# Patient Record
Sex: Female | Born: 1945 | Race: White | Hispanic: No | Smoking: Never smoker
Health system: Southern US, Community
[De-identification: ages and names within clinical notes are randomized; demographics above are authoritative.]

## PROBLEM LIST (undated history)

## (undated) DIAGNOSIS — G44229 Chronic tension-type headache, not intractable: Secondary | ICD-10-CM

## (undated) DIAGNOSIS — F329 Major depressive disorder, single episode, unspecified: Secondary | ICD-10-CM

## (undated) DIAGNOSIS — F32A Depression, unspecified: Secondary | ICD-10-CM

## (undated) DIAGNOSIS — K602 Anal fissure, unspecified: Secondary | ICD-10-CM

## (undated) DIAGNOSIS — K269 Duodenal ulcer, unspecified as acute or chronic, without hemorrhage or perforation: Secondary | ICD-10-CM

## (undated) DIAGNOSIS — K7689 Other specified diseases of liver: Secondary | ICD-10-CM

## (undated) DIAGNOSIS — K315 Obstruction of duodenum: Secondary | ICD-10-CM

## (undated) DIAGNOSIS — K8020 Calculus of gallbladder without cholecystitis without obstruction: Secondary | ICD-10-CM

## (undated) DIAGNOSIS — E079 Disorder of thyroid, unspecified: Secondary | ICD-10-CM

## (undated) DIAGNOSIS — E039 Hypothyroidism, unspecified: Secondary | ICD-10-CM

## (undated) DIAGNOSIS — E559 Vitamin D deficiency, unspecified: Secondary | ICD-10-CM

## (undated) DIAGNOSIS — M797 Fibromyalgia: Secondary | ICD-10-CM

## (undated) DIAGNOSIS — M1990 Unspecified osteoarthritis, unspecified site: Secondary | ICD-10-CM

## (undated) DIAGNOSIS — D509 Iron deficiency anemia, unspecified: Secondary | ICD-10-CM

## (undated) DIAGNOSIS — F419 Anxiety disorder, unspecified: Secondary | ICD-10-CM

## (undated) DIAGNOSIS — M419 Scoliosis, unspecified: Secondary | ICD-10-CM

## (undated) DIAGNOSIS — E538 Deficiency of other specified B group vitamins: Secondary | ICD-10-CM

## (undated) DIAGNOSIS — K219 Gastro-esophageal reflux disease without esophagitis: Secondary | ICD-10-CM

## (undated) DIAGNOSIS — H409 Unspecified glaucoma: Secondary | ICD-10-CM

## (undated) DIAGNOSIS — I10 Essential (primary) hypertension: Secondary | ICD-10-CM

## (undated) DIAGNOSIS — T7840XA Allergy, unspecified, initial encounter: Secondary | ICD-10-CM

## (undated) DIAGNOSIS — K311 Adult hypertrophic pyloric stenosis: Secondary | ICD-10-CM

## (undated) DIAGNOSIS — K648 Other hemorrhoids: Secondary | ICD-10-CM

## (undated) DIAGNOSIS — M8580 Other specified disorders of bone density and structure, unspecified site: Secondary | ICD-10-CM

## (undated) HISTORY — DX: Fibromyalgia: M79.7

## (undated) HISTORY — DX: Anxiety disorder, unspecified: F41.9

## (undated) HISTORY — DX: Iron deficiency anemia, unspecified: D50.9

## (undated) HISTORY — DX: Duodenal ulcer, unspecified as acute or chronic, without hemorrhage or perforation: K26.9

## (undated) HISTORY — DX: Unspecified glaucoma: H40.9

## (undated) HISTORY — DX: Calculus of gallbladder without cholecystitis without obstruction: K80.20

## (undated) HISTORY — DX: Hypothyroidism, unspecified: E03.9

## (undated) HISTORY — DX: Scoliosis, unspecified: M41.9

## (undated) HISTORY — DX: Essential (primary) hypertension: I10

## (undated) HISTORY — DX: Other specified disorders of bone density and structure, unspecified site: M85.80

## (undated) HISTORY — DX: Disorder of thyroid, unspecified: E07.9

## (undated) HISTORY — DX: Other hemorrhoids: K64.8

## (undated) HISTORY — DX: Anal fissure, unspecified: K60.2

## (undated) HISTORY — DX: Chronic tension-type headache, not intractable: G44.229

## (undated) HISTORY — DX: Adult hypertrophic pyloric stenosis: K31.1

## (undated) HISTORY — PX: TONSILLECTOMY AND ADENOIDECTOMY: SUR1326

## (undated) HISTORY — DX: Depression, unspecified: F32.A

## (undated) HISTORY — DX: Obstruction of duodenum: K31.5

## (undated) HISTORY — DX: Unspecified osteoarthritis, unspecified site: M19.90

## (undated) HISTORY — DX: Major depressive disorder, single episode, unspecified: F32.9

## (undated) HISTORY — DX: Other specified diseases of liver: K76.89

## (undated) HISTORY — DX: Gastro-esophageal reflux disease without esophagitis: K21.9

## (undated) HISTORY — DX: Allergy, unspecified, initial encounter: T78.40XA

## (undated) HISTORY — DX: Vitamin D deficiency, unspecified: E55.9

## (undated) HISTORY — DX: Deficiency of other specified B group vitamins: E53.8

---

## 1973-09-20 HISTORY — PX: APPENDECTOMY: SHX54

## 1997-12-24 ENCOUNTER — Encounter: Admission: RE | Admit: 1997-12-24 | Discharge: 1998-03-24 | Payer: Self-pay | Admitting: Internal Medicine

## 1998-09-01 ENCOUNTER — Other Ambulatory Visit: Admission: RE | Admit: 1998-09-01 | Discharge: 1998-09-01 | Payer: Self-pay | Admitting: *Deleted

## 1999-08-25 ENCOUNTER — Other Ambulatory Visit: Admission: RE | Admit: 1999-08-25 | Discharge: 1999-08-25 | Payer: Self-pay | Admitting: *Deleted

## 1999-09-21 HISTORY — PX: SHOULDER ARTHROSCOPY: SHX128

## 1999-09-22 ENCOUNTER — Encounter: Payer: Self-pay | Admitting: Emergency Medicine

## 1999-09-22 ENCOUNTER — Emergency Department (HOSPITAL_COMMUNITY): Admission: EM | Admit: 1999-09-22 | Discharge: 1999-09-22 | Payer: Self-pay | Admitting: Emergency Medicine

## 1999-12-20 ENCOUNTER — Encounter: Payer: Self-pay | Admitting: Emergency Medicine

## 1999-12-20 ENCOUNTER — Emergency Department (HOSPITAL_COMMUNITY): Admission: EM | Admit: 1999-12-20 | Discharge: 1999-12-20 | Payer: Self-pay | Admitting: Emergency Medicine

## 2000-08-30 ENCOUNTER — Other Ambulatory Visit: Admission: RE | Admit: 2000-08-30 | Discharge: 2000-08-30 | Payer: Self-pay | Admitting: *Deleted

## 2000-10-25 ENCOUNTER — Encounter: Payer: Self-pay | Admitting: Internal Medicine

## 2000-10-25 ENCOUNTER — Encounter: Admission: RE | Admit: 2000-10-25 | Discharge: 2000-10-25 | Payer: Self-pay | Admitting: Internal Medicine

## 2001-02-08 ENCOUNTER — Encounter: Payer: Self-pay | Admitting: Internal Medicine

## 2001-02-08 ENCOUNTER — Encounter: Admission: RE | Admit: 2001-02-08 | Discharge: 2001-02-08 | Payer: Self-pay | Admitting: Internal Medicine

## 2001-04-10 ENCOUNTER — Encounter: Payer: Self-pay | Admitting: Emergency Medicine

## 2001-04-10 ENCOUNTER — Emergency Department (HOSPITAL_COMMUNITY): Admission: EM | Admit: 2001-04-10 | Discharge: 2001-04-10 | Payer: Self-pay | Admitting: Emergency Medicine

## 2001-09-15 ENCOUNTER — Encounter: Payer: Self-pay | Admitting: Surgery

## 2001-09-15 ENCOUNTER — Ambulatory Visit (HOSPITAL_COMMUNITY): Admission: RE | Admit: 2001-09-15 | Discharge: 2001-09-15 | Payer: Self-pay | Admitting: Surgery

## 2002-09-10 ENCOUNTER — Other Ambulatory Visit: Admission: RE | Admit: 2002-09-10 | Discharge: 2002-09-10 | Payer: Self-pay | Admitting: Obstetrics and Gynecology

## 2003-04-12 ENCOUNTER — Encounter: Admission: RE | Admit: 2003-04-12 | Discharge: 2003-04-12 | Payer: Self-pay | Admitting: Internal Medicine

## 2003-04-12 ENCOUNTER — Encounter: Payer: Self-pay | Admitting: Internal Medicine

## 2003-10-25 ENCOUNTER — Other Ambulatory Visit: Admission: RE | Admit: 2003-10-25 | Discharge: 2003-10-25 | Payer: Self-pay | Admitting: Obstetrics and Gynecology

## 2004-03-06 ENCOUNTER — Emergency Department (HOSPITAL_COMMUNITY): Admission: EM | Admit: 2004-03-06 | Discharge: 2004-03-06 | Payer: Self-pay | Admitting: Family Medicine

## 2004-03-12 ENCOUNTER — Emergency Department (HOSPITAL_COMMUNITY): Admission: EM | Admit: 2004-03-12 | Discharge: 2004-03-12 | Payer: Self-pay | Admitting: Family Medicine

## 2004-11-11 ENCOUNTER — Other Ambulatory Visit: Admission: RE | Admit: 2004-11-11 | Discharge: 2004-11-11 | Payer: Self-pay | Admitting: Obstetrics and Gynecology

## 2004-12-16 ENCOUNTER — Encounter: Admission: RE | Admit: 2004-12-16 | Discharge: 2004-12-16 | Payer: Self-pay | Admitting: Internal Medicine

## 2004-12-18 ENCOUNTER — Encounter: Admission: RE | Admit: 2004-12-18 | Discharge: 2004-12-18 | Payer: Self-pay | Admitting: Internal Medicine

## 2005-04-20 ENCOUNTER — Encounter: Admission: RE | Admit: 2005-04-20 | Discharge: 2005-04-20 | Payer: Self-pay | Admitting: Internal Medicine

## 2005-08-16 ENCOUNTER — Emergency Department (HOSPITAL_COMMUNITY): Admission: EM | Admit: 2005-08-16 | Discharge: 2005-08-17 | Payer: Self-pay | Admitting: Emergency Medicine

## 2005-11-22 ENCOUNTER — Other Ambulatory Visit: Admission: RE | Admit: 2005-11-22 | Discharge: 2005-11-22 | Payer: Self-pay | Admitting: Obstetrics and Gynecology

## 2006-04-13 ENCOUNTER — Encounter: Admission: RE | Admit: 2006-04-13 | Discharge: 2006-04-13 | Payer: Self-pay | Admitting: Internal Medicine

## 2006-05-04 ENCOUNTER — Ambulatory Visit: Payer: Self-pay | Admitting: Gastroenterology

## 2006-05-25 ENCOUNTER — Ambulatory Visit: Payer: Self-pay | Admitting: Gastroenterology

## 2006-06-07 ENCOUNTER — Ambulatory Visit: Payer: Self-pay | Admitting: Gastroenterology

## 2007-01-23 ENCOUNTER — Encounter: Admission: RE | Admit: 2007-01-23 | Discharge: 2007-01-23 | Payer: Self-pay | Admitting: Internal Medicine

## 2007-02-12 ENCOUNTER — Emergency Department (HOSPITAL_COMMUNITY): Admission: EM | Admit: 2007-02-12 | Discharge: 2007-02-12 | Payer: Self-pay | Admitting: Family Medicine

## 2007-10-31 ENCOUNTER — Encounter: Admission: RE | Admit: 2007-10-31 | Discharge: 2007-10-31 | Payer: Self-pay | Admitting: Internal Medicine

## 2007-11-28 ENCOUNTER — Emergency Department (HOSPITAL_COMMUNITY): Admission: EM | Admit: 2007-11-28 | Discharge: 2007-11-29 | Payer: Self-pay | Admitting: Emergency Medicine

## 2007-12-07 ENCOUNTER — Ambulatory Visit: Payer: Self-pay

## 2008-02-24 ENCOUNTER — Encounter: Admission: RE | Admit: 2008-02-24 | Discharge: 2008-02-24 | Payer: Self-pay | Admitting: Internal Medicine

## 2008-07-13 ENCOUNTER — Emergency Department (HOSPITAL_COMMUNITY): Admission: EM | Admit: 2008-07-13 | Discharge: 2008-07-13 | Payer: Self-pay | Admitting: Family Medicine

## 2008-08-19 ENCOUNTER — Ambulatory Visit: Payer: Self-pay | Admitting: Internal Medicine

## 2008-09-05 ENCOUNTER — Ambulatory Visit: Payer: Self-pay | Admitting: Internal Medicine

## 2008-10-21 ENCOUNTER — Ambulatory Visit: Payer: Self-pay | Admitting: Internal Medicine

## 2008-11-21 ENCOUNTER — Ambulatory Visit: Payer: Self-pay | Admitting: Internal Medicine

## 2008-11-26 ENCOUNTER — Ambulatory Visit: Payer: Self-pay | Admitting: Internal Medicine

## 2008-12-09 ENCOUNTER — Ambulatory Visit: Payer: Self-pay | Admitting: Internal Medicine

## 2009-02-11 ENCOUNTER — Ambulatory Visit: Payer: Self-pay | Admitting: Internal Medicine

## 2009-03-29 ENCOUNTER — Emergency Department (HOSPITAL_COMMUNITY): Admission: EM | Admit: 2009-03-29 | Discharge: 2009-03-29 | Payer: Self-pay | Admitting: Family Medicine

## 2009-05-19 ENCOUNTER — Ambulatory Visit: Payer: Self-pay | Admitting: Internal Medicine

## 2009-06-30 ENCOUNTER — Ambulatory Visit: Payer: Self-pay | Admitting: Internal Medicine

## 2009-08-12 ENCOUNTER — Ambulatory Visit: Payer: Self-pay | Admitting: Internal Medicine

## 2009-10-07 ENCOUNTER — Ambulatory Visit: Payer: Self-pay | Admitting: Internal Medicine

## 2009-11-03 ENCOUNTER — Ambulatory Visit: Payer: Self-pay | Admitting: Internal Medicine

## 2010-05-26 ENCOUNTER — Ambulatory Visit: Payer: Self-pay | Admitting: Internal Medicine

## 2010-06-01 ENCOUNTER — Ambulatory Visit: Payer: Self-pay | Admitting: Internal Medicine

## 2010-07-17 ENCOUNTER — Ambulatory Visit: Payer: Self-pay | Admitting: Internal Medicine

## 2010-07-20 ENCOUNTER — Ambulatory Visit: Payer: Self-pay | Admitting: Internal Medicine

## 2010-08-06 ENCOUNTER — Ambulatory Visit: Payer: Self-pay | Admitting: Internal Medicine

## 2010-08-12 ENCOUNTER — Encounter: Admission: RE | Admit: 2010-08-12 | Discharge: 2010-08-12 | Payer: Self-pay | Admitting: Internal Medicine

## 2010-09-20 LAB — HM PAP SMEAR

## 2010-12-28 ENCOUNTER — Ambulatory Visit (INDEPENDENT_AMBULATORY_CARE_PROVIDER_SITE_OTHER): Payer: Medicare Other | Admitting: Internal Medicine

## 2010-12-28 ENCOUNTER — Other Ambulatory Visit: Payer: Self-pay | Admitting: Internal Medicine

## 2010-12-28 DIAGNOSIS — K7689 Other specified diseases of liver: Secondary | ICD-10-CM

## 2010-12-28 DIAGNOSIS — E039 Hypothyroidism, unspecified: Secondary | ICD-10-CM

## 2011-03-01 ENCOUNTER — Other Ambulatory Visit: Payer: Self-pay | Admitting: Internal Medicine

## 2011-05-11 ENCOUNTER — Other Ambulatory Visit: Payer: Self-pay | Admitting: Internal Medicine

## 2011-06-02 ENCOUNTER — Inpatient Hospital Stay (INDEPENDENT_AMBULATORY_CARE_PROVIDER_SITE_OTHER)
Admission: RE | Admit: 2011-06-02 | Discharge: 2011-06-02 | Disposition: A | Discharge status: Home / Self Care | Payer: Medicare Other | Source: Ambulatory Visit | Attending: Family Medicine | Admitting: Family Medicine

## 2011-06-02 DIAGNOSIS — T148XXA Other injury of unspecified body region, initial encounter: Secondary | ICD-10-CM

## 2011-06-14 LAB — I-STAT 8, (EC8 V) (CONVERTED LAB)
HCT: 40
Hemoglobin: 13.6
Potassium: 4.2
Sodium: 127 — ABNORMAL LOW
TCO2: 26
pCO2, Ven: 39.5 — ABNORMAL LOW
pH, Ven: 7.406 — ABNORMAL HIGH

## 2011-06-14 LAB — CBC
MCHC: 35.1
Platelets: 330
RBC: 3.78 — ABNORMAL LOW
RDW: 12.8
WBC: 8.1

## 2011-06-14 LAB — DIFFERENTIAL
Eosinophils Absolute: 0.2
Eosinophils Relative: 2
Lymphocytes Relative: 35
Lymphs Abs: 2.8
Monocytes Relative: 9
Neutrophils Relative %: 54

## 2011-06-14 LAB — POCT CARDIAC MARKERS
CKMB, poc: <1 — ABNORMAL LOW
Myoglobin, poc: 35.9
Operator id: 196461
Troponin i, poc: <0.05
Troponin i, poc: <0.05

## 2011-06-14 LAB — D-DIMER, QUANTITATIVE: D-Dimer, Quant: 0.25

## 2011-06-24 ENCOUNTER — Other Ambulatory Visit: Payer: Self-pay | Admitting: Internal Medicine

## 2011-07-05 ENCOUNTER — Encounter: Payer: Self-pay | Admitting: Internal Medicine

## 2011-07-05 ENCOUNTER — Other Ambulatory Visit: Payer: Medicare Other | Admitting: Internal Medicine

## 2011-07-05 ENCOUNTER — Ambulatory Visit
Admission: RE | Admit: 2011-07-05 | Discharge: 2011-07-05 | Disposition: A | Discharge status: Home / Self Care | Payer: Medicare Other | Source: Ambulatory Visit | Attending: Internal Medicine | Admitting: Internal Medicine

## 2011-07-05 ENCOUNTER — Other Ambulatory Visit: Payer: Self-pay | Admitting: Internal Medicine

## 2011-07-05 ENCOUNTER — Ambulatory Visit (INDEPENDENT_AMBULATORY_CARE_PROVIDER_SITE_OTHER): Payer: Medicare Other | Admitting: Internal Medicine

## 2011-07-05 VITALS — BP 124/64 | HR 64 | Temp 97.4°F | Ht <= 58 in | Wt 143.0 lb

## 2011-07-05 DIAGNOSIS — M797 Fibromyalgia: Secondary | ICD-10-CM

## 2011-07-05 DIAGNOSIS — Z23 Encounter for immunization: Secondary | ICD-10-CM

## 2011-07-05 DIAGNOSIS — G44209 Tension-type headache, unspecified, not intractable: Secondary | ICD-10-CM

## 2011-07-05 DIAGNOSIS — E039 Hypothyroidism, unspecified: Secondary | ICD-10-CM

## 2011-07-05 DIAGNOSIS — K219 Gastro-esophageal reflux disease without esophagitis: Secondary | ICD-10-CM

## 2011-07-05 DIAGNOSIS — F419 Anxiety disorder, unspecified: Secondary | ICD-10-CM

## 2011-07-05 DIAGNOSIS — M8580 Other specified disorders of bone density and structure, unspecified site: Secondary | ICD-10-CM

## 2011-07-05 DIAGNOSIS — F329 Major depressive disorder, single episode, unspecified: Secondary | ICD-10-CM

## 2011-07-05 DIAGNOSIS — R1013 Epigastric pain: Secondary | ICD-10-CM

## 2011-07-05 DIAGNOSIS — I10 Essential (primary) hypertension: Secondary | ICD-10-CM

## 2011-07-05 DIAGNOSIS — F1011 Alcohol abuse, in remission: Secondary | ICD-10-CM

## 2011-07-05 DIAGNOSIS — F32A Depression, unspecified: Secondary | ICD-10-CM

## 2011-07-05 DIAGNOSIS — Z0000 Encounter for general adult medical examination without abnormal findings: Secondary | ICD-10-CM

## 2011-07-05 LAB — COMPREHENSIVE METABOLIC PANEL
AST: 19 U/L (ref 0–37)
Albumin: 3.9 g/dL (ref 3.5–5.2)
Alkaline Phosphatase: 68 U/L (ref 39–117)
BUN: 8 mg/dL (ref 6–23)
Potassium: 4.7 mEq/L (ref 3.5–5.3)
Sodium: 133 mEq/L — ABNORMAL LOW (ref 135–145)
Total Bilirubin: 0.3 mg/dL (ref 0.3–1.2)
Total Protein: 6 g/dL (ref 6.0–8.3)

## 2011-07-05 LAB — CBC WITH DIFFERENTIAL/PLATELET
Basophils Relative: 1 % (ref 0–1)
Eosinophils Absolute: 0.4 10*3/uL (ref 0.0–0.7)
Eosinophils Relative: 5 % (ref 0–5)
Lymphocytes Relative: 28 % (ref 12–46)
MCHC: 33.4 g/dL (ref 30.0–36.0)
Neutro Abs: 5.1 10*3/uL (ref 1.7–7.7)
Neutrophils Relative %: 59 % (ref 43–77)
Platelets: 417 10*3/uL — ABNORMAL HIGH (ref 150–400)
RDW: 13.9 % (ref 11.5–15.5)

## 2011-07-05 LAB — POCT URINALYSIS DIPSTICK
Ketones, UA: NEGATIVE
Protein, UA: NEGATIVE
Spec Grav, UA: 1
Urobilinogen, UA: NEGATIVE
pH, UA: 7.5

## 2011-07-05 LAB — LIPID PANEL
HDL: 34 mg/dL — ABNORMAL LOW (ref >39)
LDL Cholesterol: 115 mg/dL — ABNORMAL HIGH (ref 0–99)
Triglycerides: 205 mg/dL — ABNORMAL HIGH (ref <150)
VLDL: 41 mg/dL — ABNORMAL HIGH (ref 0–40)

## 2011-07-05 LAB — TSH: TSH: 1.226 u[IU]/mL (ref 0.350–4.500)

## 2011-07-06 ENCOUNTER — Telehealth: Payer: Self-pay | Admitting: *Deleted

## 2011-07-06 MED ORDER — LUBIPROSTONE 8 MCG PO CAPS: 8.0000 ug | ORAL_CAPSULE | Freq: Every day | ORAL | Status: AC

## 2011-07-06 NOTE — Telephone Encounter (Signed)
Rx called in per MD 

## 2011-07-09 ENCOUNTER — Telehealth: Payer: Self-pay | Admitting: Internal Medicine

## 2011-07-09 ENCOUNTER — Encounter: Payer: Self-pay | Admitting: Internal Medicine

## 2011-07-09 NOTE — Telephone Encounter (Signed)
Prescription for Amitiza 8 mg # 60 1 po bid faxed to [facility] [phone]) per Dr. Lenord Fellers.  Pt aware.  Dr. Lenord Fellers spoke directly to patient.

## 2011-07-09 NOTE — Telephone Encounter (Signed)
Spoke with pt. Once a day Amitiza not working. Has had to take 2 enemas. Increase 8mg  Amitiza to bid. New Rx sent to [facility].  Call back if no better by Monday. Can Increase dose  to 24 mg  if needed. Recent KUB showed stool which I think is causing her C/0 of bloating.

## 2011-07-11 ENCOUNTER — Other Ambulatory Visit: Payer: Self-pay | Admitting: Internal Medicine

## 2011-07-12 ENCOUNTER — Telehealth: Payer: Self-pay | Admitting: Internal Medicine

## 2011-07-12 NOTE — Telephone Encounter (Signed)
No continue on this dose for a couple of weeks may not work the same every day.

## 2011-07-12 NOTE — Telephone Encounter (Signed)
Pt advised to continue dosing as previously directed per Dr. Lenord Fellers and call pharmacy for refill when she runs out. Pt verbalized understanding.

## 2011-07-24 ENCOUNTER — Other Ambulatory Visit: Payer: Self-pay | Admitting: Internal Medicine

## 2011-07-26 DIAGNOSIS — F419 Anxiety disorder, unspecified: Secondary | ICD-10-CM | POA: Insufficient documentation

## 2011-07-26 DIAGNOSIS — E039 Hypothyroidism, unspecified: Secondary | ICD-10-CM | POA: Insufficient documentation

## 2011-07-26 DIAGNOSIS — M8580 Other specified disorders of bone density and structure, unspecified site: Secondary | ICD-10-CM | POA: Insufficient documentation

## 2011-07-26 DIAGNOSIS — M797 Fibromyalgia: Secondary | ICD-10-CM | POA: Insufficient documentation

## 2011-07-26 DIAGNOSIS — F32A Depression, unspecified: Secondary | ICD-10-CM | POA: Insufficient documentation

## 2011-07-26 DIAGNOSIS — K5900 Constipation, unspecified: Secondary | ICD-10-CM | POA: Insufficient documentation

## 2011-07-26 DIAGNOSIS — I10 Essential (primary) hypertension: Secondary | ICD-10-CM | POA: Insufficient documentation

## 2011-07-26 DIAGNOSIS — F1011 Alcohol abuse, in remission: Secondary | ICD-10-CM | POA: Insufficient documentation

## 2011-07-26 DIAGNOSIS — J309 Allergic rhinitis, unspecified: Secondary | ICD-10-CM | POA: Insufficient documentation

## 2011-07-26 DIAGNOSIS — K219 Gastro-esophageal reflux disease without esophagitis: Secondary | ICD-10-CM | POA: Insufficient documentation

## 2011-07-26 DIAGNOSIS — G44209 Tension-type headache, unspecified, not intractable: Secondary | ICD-10-CM | POA: Insufficient documentation

## 2011-07-26 DIAGNOSIS — J45909 Unspecified asthma, uncomplicated: Secondary | ICD-10-CM | POA: Insufficient documentation

## 2011-07-26 NOTE — Patient Instructions (Signed)
Continue same medications. Try Amitiza 8 mg daily for chronic constipation. Return in 6 months

## 2011-07-26 NOTE — Progress Notes (Signed)
  Subjective:    Patient ID: Sue Green, female    DOB: [birth year], 65 y.o.   MRN: [id number]  HPI 65 year old white female with history of multiple medical problems including hypertension, anxiety depression, recovering alcoholic, allergic rhinitis, GE reflux, asthma, fibromyalgia, osteopenia, tension headache, hypothyroidism and constipation for health maintenance and evaluation of medical issues. Complaining bitterly of constipation issues. Had colonoscopy February 23, 2006 and has had an extensive GI workup. Has frequent coughing treated with Tessalon Perles. 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 She is divorced.  Past medical history: had tonsillectomy and adenoidectomy Feb 23, 1954, wisdom tooth extraction 1967, appendectomy 23-Feb-1974. Mucous cyst removed from right thumb 1996. Right shoulder impingement arthroscopic surgery Feb 24, 2000. History of shingles 24-Feb-2007  Family history: Mother died in 51 at age 37 with dementia and diabetes mellitus. One brother in good health. Father died at age 32 with pancreatic cancer    Review of Systems  Constitutional: Positive for fatigue.  HENT: Positive for congestion.   Eyes: Negative.   Respiratory: Positive for cough.   Gastrointestinal: Positive for abdominal distention.  Genitourinary: Negative.   Neurological: Positive for headaches.  Hematological: Negative.   Psychiatric/Behavioral: Positive for dysphoric mood.       Objective:   Physical Exam  Vitals reviewed. Constitutional: She is oriented to person, place, and time. She appears well-developed and well-nourished.  HENT:  Head: Normocephalic and atraumatic.  Right Ear: External ear normal.  Left Ear: External ear normal.  Mouth/Throat: Oropharynx is clear and moist.  Eyes: Conjunctivae and EOM are normal. Pupils are equal, round, and reactive to light.  Neck: Neck supple. No JVD present. No thyromegaly present.  Cardiovascular: Normal  rate, regular rhythm and normal heart sounds.   Pulmonary/Chest: Effort normal and breath sounds normal. She has no wheezes. She has no rales.       Breasts normal female  Abdominal: Soft. Bowel sounds are normal. She exhibits distension. She exhibits no mass. There is no tenderness. There is no rebound.  Genitourinary:       Deferred  Musculoskeletal: She exhibits no edema.  Lymphadenopathy:    She has no cervical adenopathy.  Neurological: She is alert and oriented to person, place, and time. She has normal reflexes. No cranial nerve deficit.  Skin: Skin is warm and dry. She is not diaphoretic.  Psychiatric: Her behavior is normal. Thought content normal.          Assessment & Plan:  Constipation  Hypertension  Anxiety depression  Allergic rhinitis  GE reflux  Asthma  Fibromyalgia  Osteopenia  Tension headache  Hypothyroidism  Plan trial of Amitiza 8 mg. Return in 6 months or as needed

## 2011-07-27 ENCOUNTER — Telehealth: Payer: Self-pay | Admitting: Gastroenterology

## 2011-07-27 NOTE — Telephone Encounter (Signed)
Patient advised that she will be placed on the cancellation list  She is advised that she is welcome to call for cancellation openings.

## 2011-07-27 NOTE — Telephone Encounter (Signed)
Patient c/o constipation.  She spoke with Dr Marina Goodell last night on call.  She was instructed to start Miralax BID.  Patient not seen in the office since 2007.  I have scheduled her an appt for 08/17/11

## 2011-08-17 ENCOUNTER — Encounter: Payer: Self-pay | Admitting: Gastroenterology

## 2011-08-17 ENCOUNTER — Ambulatory Visit (INDEPENDENT_AMBULATORY_CARE_PROVIDER_SITE_OTHER): Payer: Medicare Other | Admitting: Gastroenterology

## 2011-08-17 VITALS — BP 128/76 | HR 72 | Ht 62.0 in | Wt 136.0 lb

## 2011-08-17 DIAGNOSIS — K5900 Constipation, unspecified: Secondary | ICD-10-CM

## 2011-08-17 DIAGNOSIS — R198 Other specified symptoms and signs involving the digestive system and abdomen: Secondary | ICD-10-CM

## 2011-08-17 MED ORDER — PEG-KCL-NACL-NASULF-NA ASC-C 100 G PO SOLR: 1.0000 | Freq: Once | ORAL | Status: DC

## 2011-08-17 NOTE — Patient Instructions (Addendum)
You have been scheduled for a Colonoscopy with propofol. See separate instructions.  Pick up your prep kit from your pharmacy.  Take your Miralax 2-3 x daily for constipation.  cc: Sharlet Salina, MD

## 2011-08-17 NOTE — Progress Notes (Addendum)
History of Present Illness: This is a 65 year old female that I have seen in the past and has very long history of constipation. She's been treated for constipation at least since the 1990s  She previously had a colonoscopy performed in 2002 for constipation, abdominal pain and bloating and colonoscopy performed in 2007 for constipation and hematochezia. Her colon was noted to be tortuous at that time.  She has tried MiraLax which has been relatively effective at once or twice daily. She tried Sport and exercise psychologist for a while but became constipated while taking it and had to manually disimpact herself in October. She notes her stools have been slightly smaller for the past several months. Denies weight loss, abdominal pain, diarrhea, melena, hematochezia, nausea, vomiting, dysphagia, reflux symptoms, chest pain.  Review of Systems: Pertinent positive and negative review of systems were noted in the above HPI section. All other review of systems were otherwise negative.  Current Medications, Allergies, Past Medical History, Past Surgical History, Family History and Social History were reviewed in [employer] record.  Physical Exam: General: Well developed , well nourished, no acute distress Head: Normocephalic and atraumatic Eyes:  sclerae anicteric, EOMI Ears: Normal auditory acuity Mouth: No deformity or lesions Neck: Supple, no masses or thyromegaly Lungs: Clear throughout to auscultation Heart: Regular rate and rhythm; no murmurs, rubs or bruits Abdomen: Soft, non tender and non distended. No masses, hepatosplenomegaly or hernias noted. Normal Bowel sounds Rectal: Deferred to colonoscopy  Musculoskeletal: Symmetrical with no gross deformities  Skin: No lesions on visible extremities Pulses:  Normal pulses noted Extremities: No clubbing, cyanosis, edema or deformities noted Neurological: Alert oriented x 4, grossly nonfocal Cervical Nodes:  No significant cervical  adenopathy Inguinal Nodes: No significant inguinal adenopathy Psychological:  Alert and cooperative. Anxious.  Assessment and Recommendations:  1. Chronic constipation associated with abdominal bloating and smaller stools. I suspect all her symptoms are due to her chronic constipation. Rule out colorectal neoplasms. Continue a daily fiber supplement and a daily stool softener. Increase MiraLax to 2 or 3 times each day. The risks, benefits, and alternatives to colonoscopy with possible biopsy and possible polypectomy were discussed with the patient and they consent to proceed.

## 2011-08-18 ENCOUNTER — Ambulatory Visit (AMBULATORY_SURGERY_CENTER): Payer: Medicare Other | Admitting: Gastroenterology

## 2011-08-18 ENCOUNTER — Encounter: Payer: Self-pay | Admitting: Gastroenterology

## 2011-08-18 VITALS — BP 134/57 | HR 73 | Temp 98.0°F | Resp 20 | Ht 62.0 in | Wt 136.0 lb

## 2011-08-18 DIAGNOSIS — R198 Other specified symptoms and signs involving the digestive system and abdomen: Secondary | ICD-10-CM

## 2011-08-18 DIAGNOSIS — K5900 Constipation, unspecified: Secondary | ICD-10-CM

## 2011-08-18 MED ORDER — SODIUM CHLORIDE 0.9 % IV SOLN: 500.0000 mL | INTRAVENOUS | Status: DC

## 2011-08-18 NOTE — Progress Notes (Signed)
Propofol administered by m smith crna. See scanned intra procedure report. ewm 

## 2011-08-18 NOTE — Progress Notes (Signed)
Patient did not experience any of the following events: a burn prior to discharge; a fall within the facility; wrong site/side/patient/procedure/implant event; or a hospital transfer or hospital admission upon discharge from the facility. (G8907) Patient did not have preoperative order for IV antibiotic SSI prophylaxis. (G8918)  

## 2011-08-19 ENCOUNTER — Telehealth: Payer: Self-pay

## 2011-08-19 NOTE — Telephone Encounter (Signed)
I left message for the pt to call us back if she had any questions or concerns.  maw

## 2011-08-24 ENCOUNTER — Other Ambulatory Visit: Payer: Self-pay | Admitting: Internal Medicine

## 2011-09-06 ENCOUNTER — Telehealth: Payer: Self-pay

## 2011-09-06 NOTE — Telephone Encounter (Signed)
Patient advised to stay on clear liquids, discontinue Miralax, and call the gastroenterologist for symptoms

## 2011-09-08 ENCOUNTER — Encounter: Payer: Self-pay | Admitting: Gastroenterology

## 2011-09-08 ENCOUNTER — Ambulatory Visit (INDEPENDENT_AMBULATORY_CARE_PROVIDER_SITE_OTHER): Payer: Medicare Other | Admitting: Gastroenterology

## 2011-09-08 VITALS — BP 122/60 | HR 64 | Ht 59.0 in | Wt 133.4 lb

## 2011-09-08 DIAGNOSIS — K219 Gastro-esophageal reflux disease without esophagitis: Secondary | ICD-10-CM

## 2011-09-08 DIAGNOSIS — K5900 Constipation, unspecified: Secondary | ICD-10-CM

## 2011-09-08 DIAGNOSIS — R112 Nausea with vomiting, unspecified: Secondary | ICD-10-CM

## 2011-09-08 MED ORDER — ONDANSETRON HCL 4 MG PO TABS: 4.0000 mg | ORAL_TABLET | Freq: Four times a day (QID) | ORAL | Status: AC

## 2011-09-08 NOTE — Patient Instructions (Addendum)
You have been given Anti reflux measures to follow. Please change your Protonix to 1 time daily everyday. Call us if symptoms do not improve. We have sent Zofran to your pharmacy to use for your nausea and vomiting. If you do not improve you may need an Endoscopy. CC:  Sharlet Salina MD

## 2011-09-08 NOTE — Progress Notes (Signed)
History of Present Illness: This is a 65 year old female complaining of intermittent nausea and vomiting the past 10 days. She was recently evaluated for chronic constipation and underwent colonoscopy jn November which was unremarkable. She is now treated with MiraLax on a daily basis as higher doses led to diarrhea. She states her nausea and vomiting has occurred after certain juices and spicy foods. She takes pantoprazole as needed instead of on daily basis. For the past 2 days she has used antacids on a regular. She denies any other medication changes. Denies weight loss, abdominal pain, constipation, diarrhea, change in stool caliber, melena, hematochezia,dysphagia, chest pain.  Current Medications, Allergies, Past Medical History, Past Surgical History, Family History and Social History were reviewed in [employer] record.  Physical Exam: General: Well developed , well nourished, no acute distress Head: Normocephalic and atraumatic Eyes:  sclerae anicteric, EOMI Ears: Normal auditory acuity Mouth: No deformity or lesions Lungs: Clear throughout to auscultation Heart: Regular rate and rhythm; no murmurs, rubs or bruits Abdomen: Soft, non tender and non distended. No masses, hepatosplenomegaly or hernias noted. Normal Bowel sounds Musculoskeletal: Symmetrical with no gross deformities  Pulses:  Normal pulses noted Extremities: No clubbing, cyanosis, edema or deformities noted Neurological: Alert oriented x 4, grossly nonfocal Psychological:  Alert and cooperative. Flat affect  Assessment and Recommendations:  1. Chronic constipation under good control with daily MiraLax.  2. GERD with intermittent regurgitation, nausea and vomiting. Intensify all antireflux measures and take pantoprazole 40 mg a daily basis for at least the next 4-6 weeks and she may then switch to as needed if her symptoms are under excellent control. Zofran 4-8 mg every 6 hours as needed. If her  symptoms do not adequately respond she will need further evaluation including upper endoscopy.

## 2011-09-09 ENCOUNTER — Other Ambulatory Visit: Payer: Self-pay | Admitting: Internal Medicine

## 2011-09-10 ENCOUNTER — Other Ambulatory Visit: Payer: Self-pay

## 2011-09-10 MED ORDER — CYCLOBENZAPRINE HCL 10 MG PO TABS: 10.0000 mg | ORAL_TABLET | ORAL | Status: DC

## 2011-09-21 ENCOUNTER — Other Ambulatory Visit: Payer: Self-pay | Admitting: Internal Medicine

## 2011-10-22 ENCOUNTER — Other Ambulatory Visit: Payer: Self-pay | Admitting: Internal Medicine

## 2011-10-22 ENCOUNTER — Other Ambulatory Visit: Payer: Self-pay

## 2011-10-22 MED ORDER — LEVOTHYROXINE SODIUM 75 MCG PO TABS: 75.0000 ug | ORAL_TABLET | Freq: Every day | ORAL | Status: DC

## 2011-11-10 ENCOUNTER — Other Ambulatory Visit: Payer: Self-pay | Admitting: Internal Medicine

## 2011-11-12 ENCOUNTER — Other Ambulatory Visit: Payer: Self-pay | Admitting: Internal Medicine

## 2011-11-20 ENCOUNTER — Other Ambulatory Visit: Payer: Self-pay | Admitting: Internal Medicine

## 2011-12-04 ENCOUNTER — Other Ambulatory Visit: Payer: Self-pay | Admitting: Internal Medicine

## 2011-12-13 ENCOUNTER — Other Ambulatory Visit: Payer: Self-pay | Admitting: Internal Medicine

## 2011-12-15 ENCOUNTER — Other Ambulatory Visit: Payer: Self-pay | Admitting: Internal Medicine

## 2011-12-16 ENCOUNTER — Other Ambulatory Visit: Payer: Self-pay

## 2011-12-16 MED ORDER — METOPROLOL TARTRATE 50 MG PO TABS: 50.0000 mg | ORAL_TABLET | ORAL | Status: DC

## 2012-01-04 ENCOUNTER — Ambulatory Visit (INDEPENDENT_AMBULATORY_CARE_PROVIDER_SITE_OTHER): Payer: Medicare Other | Admitting: Internal Medicine

## 2012-01-04 ENCOUNTER — Encounter: Payer: Self-pay | Admitting: Internal Medicine

## 2012-01-04 VITALS — BP 118/68 | HR 80 | Resp 20 | Ht 59.0 in | Wt 141.0 lb

## 2012-01-04 DIAGNOSIS — E039 Hypothyroidism, unspecified: Secondary | ICD-10-CM

## 2012-01-04 DIAGNOSIS — F419 Anxiety disorder, unspecified: Secondary | ICD-10-CM

## 2012-01-04 DIAGNOSIS — I10 Essential (primary) hypertension: Secondary | ICD-10-CM

## 2012-01-04 DIAGNOSIS — K219 Gastro-esophageal reflux disease without esophagitis: Secondary | ICD-10-CM

## 2012-01-04 DIAGNOSIS — K5900 Constipation, unspecified: Secondary | ICD-10-CM

## 2012-01-04 DIAGNOSIS — F32A Depression, unspecified: Secondary | ICD-10-CM

## 2012-01-04 DIAGNOSIS — F341 Dysthymic disorder: Secondary | ICD-10-CM

## 2012-01-04 DIAGNOSIS — F329 Major depressive disorder, single episode, unspecified: Secondary | ICD-10-CM

## 2012-01-04 NOTE — Patient Instructions (Signed)
Continue same medications and return for physical exam in 6 months 

## 2012-01-04 NOTE — Progress Notes (Signed)
  Subjective:    Patient ID: Sue Green, female    DOB: 12-05-1945, 66 y.o.   MRN: [id number]  HPI 66 year old White female with constipation, hypertension, GE reflux in for 6 month recheck.  Had recent impaction while on Amitiza. Stopped  Amitiza, disimpacted herself and started on Miralax. However, Amitiza did help bloating. Saw Dr. Russella Dar in December 2012. He recommended MiraLAX and recommended that she take PPI one half hour before meals. At this point she is doing well just on MiraLAX and PPI. Says bowel movements are normal. She had a health screening at an outside facility 4 years ago. They have recontacted her reminding her there was a mild to moderate plaque formation of her carotids. She's asymptomatic. Explained to her that did not think we needed to pursue this at present time. Blood pressure is well-controlled on current regimen. Anxiety depression well controlled. History of hypothyroidism on thyroid replacement therapy.    Review of Systems     Objective:   Physical Exam no carotid bruits; chest clear to auscultation; cardiac exam regular rate and rhythm; extremities without edema        Assessment & Plan:  History of anxiety depression  Hypertension  Constipation  GE reflux  Hypothyroidism  Plan: Patient will return in 6 months for physical exam. No changes made in her medication today. Does not need carotid artery screening at this point in

## 2012-01-04 NOTE — Progress Notes (Signed)
Addended by: Judy Pimple on: 01/04/2012 12:01 PM   Modules accepted: Orders

## 2012-03-07 ENCOUNTER — Other Ambulatory Visit: Payer: Self-pay | Admitting: Internal Medicine

## 2012-03-19 ENCOUNTER — Other Ambulatory Visit: Payer: Self-pay | Admitting: Internal Medicine

## 2012-03-31 ENCOUNTER — Other Ambulatory Visit: Payer: Self-pay | Admitting: Internal Medicine

## 2012-04-14 ENCOUNTER — Telehealth: Payer: Self-pay | Admitting: Internal Medicine

## 2012-04-14 MED ORDER — DICLOFENAC SODIUM 75 MG PO TBEC: 75.0000 mg | DELAYED_RELEASE_TABLET | Freq: Two times a day (BID) | ORAL | Status: DC

## 2012-04-14 NOTE — Telephone Encounter (Signed)
Refill sent to [facility] 

## 2012-06-06 ENCOUNTER — Other Ambulatory Visit: Payer: Self-pay | Admitting: Internal Medicine

## 2012-07-06 ENCOUNTER — Ambulatory Visit: Payer: Medicare Other | Admitting: Internal Medicine

## 2012-07-14 ENCOUNTER — Other Ambulatory Visit: Payer: Self-pay | Admitting: Internal Medicine

## 2012-07-17 ENCOUNTER — Other Ambulatory Visit: Payer: Medicare Other | Admitting: Internal Medicine

## 2012-07-17 ENCOUNTER — Encounter: Payer: Self-pay | Admitting: Internal Medicine

## 2012-07-17 ENCOUNTER — Ambulatory Visit (INDEPENDENT_AMBULATORY_CARE_PROVIDER_SITE_OTHER): Payer: Medicare Other | Admitting: Internal Medicine

## 2012-07-17 VITALS — BP 126/74 | HR 80 | Temp 98.2°F | Ht <= 58 in | Wt 147.0 lb

## 2012-07-17 DIAGNOSIS — F1021 Alcohol dependence, in remission: Secondary | ICD-10-CM

## 2012-07-17 DIAGNOSIS — J45909 Unspecified asthma, uncomplicated: Secondary | ICD-10-CM

## 2012-07-17 DIAGNOSIS — R059 Cough, unspecified: Secondary | ICD-10-CM

## 2012-07-17 DIAGNOSIS — F418 Other specified anxiety disorders: Secondary | ICD-10-CM

## 2012-07-17 DIAGNOSIS — K5900 Constipation, unspecified: Secondary | ICD-10-CM

## 2012-07-17 DIAGNOSIS — M8580 Other specified disorders of bone density and structure, unspecified site: Secondary | ICD-10-CM

## 2012-07-17 DIAGNOSIS — E039 Hypothyroidism, unspecified: Secondary | ICD-10-CM

## 2012-07-17 DIAGNOSIS — R05 Cough: Secondary | ICD-10-CM | POA: Insufficient documentation

## 2012-07-17 DIAGNOSIS — R053 Chronic cough: Secondary | ICD-10-CM

## 2012-07-17 DIAGNOSIS — I10 Essential (primary) hypertension: Secondary | ICD-10-CM

## 2012-07-17 DIAGNOSIS — R3915 Urgency of urination: Secondary | ICD-10-CM

## 2012-07-17 DIAGNOSIS — Z23 Encounter for immunization: Secondary | ICD-10-CM

## 2012-07-17 DIAGNOSIS — IMO0001 Reserved for inherently not codable concepts without codable children: Secondary | ICD-10-CM

## 2012-07-17 DIAGNOSIS — F341 Dysthymic disorder: Secondary | ICD-10-CM

## 2012-07-17 DIAGNOSIS — K219 Gastro-esophageal reflux disease without esophagitis: Secondary | ICD-10-CM

## 2012-07-17 DIAGNOSIS — M797 Fibromyalgia: Secondary | ICD-10-CM

## 2012-07-17 LAB — POCT URINALYSIS DIPSTICK
Bilirubin, UA: NEGATIVE
Ketones, UA: NEGATIVE
Leukocytes, UA: NEGATIVE
pH, UA: 5.5

## 2012-07-17 MED ORDER — PNEUMOCOCCAL VAC POLYVALENT 25 MCG/0.5ML IJ INJ: 0.5000 mL | INJECTION | INTRAMUSCULAR | Status: AC

## 2012-07-17 MED ORDER — TETANUS-DIPHTH-ACELL PERTUSSIS 5-2.5-18.5 LF-MCG/0.5 IM SUSP: 0.5000 mL | Freq: Once | INTRAMUSCULAR | Status: DC

## 2012-07-17 NOTE — Patient Instructions (Addendum)
Fasting labs drawn today as they were not done at last visit. See urologist if incontinence issues continue despite regular voiding attempts.Return in 6 months for PE. Given Pneumovax and Flu vaccines today.

## 2012-07-17 NOTE — Progress Notes (Signed)
  Subjective:    Patient ID: Sue Green, female    DOB: 16-Sep-1946, 66 y.o.   MRN: [id number]  HPI44 year old White female for 6 month recheck. Vesicare 5 mg daily is not working for urge urinary incontinence. C/o wax build up in ears. Hurts to turn neck to right. Takes tessalon perles for cough. Takes generic Protonix and generic Singulair. Is a recovering alcoholic. Seems chronically dysthymic. Multiple complaints. Continues to complain of chronic cough which is been previously evaluated. Complains of fatigue. Has been trying water aerobics for exercise. She is convinced she has wax buildup in her ears. History of anxiety.  Order written to get Zostavax vaccine at pharmacy. Was given influenza and Pneumovax immunizations today.  History of osteopenia, anxiety depression, allergic rhinitis, constipation, fibromyalgia syndrome, GE reflux, hypothyroidism in addition to hypertension and asthma. Constipation has improved with use of MiraLAX.    Review of Systems     Objective:   Physical Exam skin is warm and dry. Nodes none. HEENT exam: TMs and pharynx are clear. There is no significant cerumen in external ear canals. Neck is supple without thyromegaly JVD or adenopathy. Chest clear to auscultation. Cardiac exam regular rate and rhythm normal S1 and S2. Alert and oriented x3. Extremities without edema       Assessment & Plan:  Hypertension  Osteopenia  History of allergic rhinitis  History of asthma  Anxiety depression  Constipation  Fibromyalgia syndrome  GE reflux  Recovering alcoholic  Hypothyroidism  Chronic cough  Urge urinary incontinence  Plan: Return in 6 months or as needed. Should urge incontinence issues continue despite taking Vesicare refer to urologist.

## 2012-07-18 LAB — COMPREHENSIVE METABOLIC PANEL
Albumin: 4.2 g/dL (ref 3.5–5.2)
BUN: 9 mg/dL (ref 6–23)
Calcium: 9.2 mg/dL (ref 8.4–10.5)
Chloride: 100 mEq/L (ref 96–112)
Glucose, Bld: 83 mg/dL (ref 70–99)
Potassium: 4.5 mEq/L (ref 3.5–5.3)

## 2012-07-18 LAB — CBC WITH DIFFERENTIAL/PLATELET
HCT: 38.8 % (ref 36.0–46.0)
Hemoglobin: 13.2 g/dL (ref 12.0–15.0)
Lymphocytes Relative: 36 % (ref 12–46)
MCHC: 34 g/dL (ref 30.0–36.0)
Monocytes Absolute: 0.7 10*3/uL (ref 0.1–1.0)
Monocytes Relative: 10 % (ref 3–12)
Neutro Abs: 3.5 10*3/uL (ref 1.7–7.7)
WBC: 7.3 10*3/uL (ref 4.0–10.5)

## 2012-07-18 LAB — LIPID PANEL
HDL: 40 mg/dL (ref >39)
Triglycerides: 125 mg/dL (ref <150)

## 2012-07-18 LAB — TSH: TSH: 1.55 u[IU]/mL (ref 0.350–4.500)

## 2012-07-19 LAB — HM PAP SMEAR: HM Pap smear: NEGATIVE

## 2012-08-18 ENCOUNTER — Telehealth: Payer: Self-pay | Admitting: Internal Medicine

## 2012-08-18 NOTE — Telephone Encounter (Signed)
Patient called complaining of left-sided pain this morning. She has not taken any Tylenol to see if pain would be relieved. She has a history of abdominal pain that has been difficult to determine etiology other than constipation. Denies any musculoskeletal strain. Pain is below left rib cage area or axillary line. Not nauseated. No vomiting or diarrhea. No urinary symptoms. Says she wants an x-ray. Advised her office was closed. Explained to her that an x-ray might not be reasonable depending on her symptoms. Suggested she go to the urgent care or emergency department. Patient became irritated and said she would be calling [place] to get another doctor. Patient accused me of her refusing to treat her because I would not guarantee her an Xray. I consider this to be  demanding and inappropriate behavior. Patient will be sent a note indicating we will be happy to transfer her records to another physician of her choice.

## 2012-08-21 ENCOUNTER — Encounter: Payer: Self-pay | Admitting: Internal Medicine

## 2012-08-24 ENCOUNTER — Other Ambulatory Visit: Payer: Self-pay | Admitting: Internal Medicine

## 2012-08-25 ENCOUNTER — Other Ambulatory Visit: Payer: Self-pay

## 2012-08-25 MED ORDER — BENZONATATE 100 MG PO CAPS: 100.0000 mg | ORAL_CAPSULE | Freq: Three times a day (TID) | ORAL | Status: DC | PRN

## 2012-08-25 NOTE — Telephone Encounter (Signed)
Refill for 30 days only--- then call patient and tell her why it is only being refilled for 30 days per terms of the letter she received allowing her time to find another doctor.

## 2012-08-25 NOTE — Telephone Encounter (Signed)
Patient informed. 

## 2012-09-03 ENCOUNTER — Other Ambulatory Visit: Payer: Self-pay | Admitting: Internal Medicine

## 2012-09-07 ENCOUNTER — Ambulatory Visit: Payer: Medicare Other | Admitting: Internal Medicine

## 2012-09-11 ENCOUNTER — Encounter: Payer: Self-pay | Admitting: Internal Medicine

## 2012-09-11 ENCOUNTER — Ambulatory Visit (INDEPENDENT_AMBULATORY_CARE_PROVIDER_SITE_OTHER): Payer: Medicare Other | Admitting: Internal Medicine

## 2012-09-11 VITALS — BP 126/72 | HR 91 | Temp 97.9°F | Ht 59.0 in | Wt 145.8 lb

## 2012-09-11 DIAGNOSIS — Z0000 Encounter for general adult medical examination without abnormal findings: Secondary | ICD-10-CM

## 2012-09-11 NOTE — Progress Notes (Signed)
HPI  Pt presents to the clinic today to establish care. She is transferring care from Dr. Beryle Quant office. She has no concerns.  Past Medical History  Diagnosis Date  . Hypertension   . Anxiety   . Allergy   . GERD (gastroesophageal reflux disease)   . Asthma   . Thyroid disease     hypothyroidism  . Fibromyalgia   . Osteopenia   . Chronic tension headaches   . Internal hemorrhoids   . Depression   . Glaucoma   . Arthritis     Current Outpatient Prescriptions  Medication Sig Dispense Refill  . Ascorbic Acid (VITAMIN C) 500 MG tablet Take 500 mg by mouth daily.        . benzonatate (TESSALON) 100 MG capsule TAKE 2 CAPSULES BY MOUTH 3 TIMES A DAY AS NEEDED FOR COUGH  60 capsule  0  . Calcium-Vitamin D-Vitamin K (CALCIUM + D) [phone] MG-UNT-MCG CHEW Chew 1 tablet by mouth 2 (two) times daily.      Marland Kitchen CINNAMON PO Take 1 capsule by mouth 2 (two) times daily. 1000mg       . Coenzyme Q10 200 MG capsule Take 200 mg by mouth daily.      . [facility] STOOL SOFTENER 100 MG capsule TAKE 1 CAPSULE BY MOUTH EVERY DAY  30 each  12  . cyclobenzaprine (FLEXERIL) 10 MG tablet TAKE 1 TABLET (10 MG TOTAL) BY MOUTH 1 DAY OR 1 DOSE.  30 tablet  5  . desvenlafaxine (PRISTIQ) 100 MG 24 hr tablet Take 100 mg by mouth daily.        Marland Kitchen glucosamine-chondroitin 500-400 MG tablet Take 1 tablet by mouth 1 dose over 46 hours.        Marland Kitchen LORazepam (ATIVAN) 1 MG tablet Take 1 mg by mouth 2 (two) times daily as needed.        . Methylcellulose, Laxative, (CITRUCEL) 500 MG TABS Take by mouth daily.      . metoprolol succinate (TOPROL-XL) 50 MG 24 hr tablet TAKE 1 TABLET EVERY DAY  30 tablet  12  . montelukast (SINGULAIR) 10 MG tablet TAKE 1 TABLET BY MOUTH EVERY DAY  30 tablet  12  . Multiple Vitamins-Minerals (ONE-A-DAY WOMENS 50+ ADVANTAGE) TABS Take 1 tablet by mouth daily.      . naproxen sodium (ALEVE) 220 MG tablet Take 220 mg by mouth 2 (two) times daily with a meal.      . Omega-3 Fatty Acids (FISH OIL) 1200 MG  CAPS Take 1 capsule by mouth 3 (three) times daily.      . pantoprazole (PROTONIX) 40 MG tablet TAKE 1 TABLET EVERY DAY  30 tablet  11  . polyethylene glycol powder (GLYCOLAX/MIRALAX) powder TAKE 17GRAMS (1 CAPFUL) IN 8 OUNCES OF WATER ONCE DAILY  527 g  12  . pramoxine (SARNA SENSITIVE) 1 % LOTN Apply topically 2 (two) times daily.      Marland Kitchen SYNTHROID 75 MCG tablet TAKE 1 TABLET (75 MCG TOTAL) BY MOUTH DAILY.  30 tablet  3  . VESICARE 5 MG tablet TAKE ONE TABLET BY MOUTH ONCE DAILY  30 tablet  12  . vitamin B-12 (CYANOCOBALAMIN) 1000 MCG tablet Take 1,000 mcg by mouth daily.      . diclofenac (VOLTAREN) 75 MG EC tablet Take 1 tablet (75 mg total) by mouth 2 (two) times daily.  60 tablet  3  . vitamin E 400 UNIT capsule Take 1,200 Units by mouth daily.  Allergies  Allergen Reactions  . Penicillins Hives  . Doxycycline Nausea Only  . Diphenhydramine     Family History  Problem Relation Age of Onset  . Diabetes Mother   . Arthritis Mother   . Cancer Father     Pancreatic  . Diabetes Father     History   Social History  . Marital Status: Divorced    Spouse Name: N/A    Number of Children: N/A  . Years of Education: 16   Occupational History  . Retired    Social History Main Topics  . Smoking status: Never Smoker   . Smokeless tobacco: Never Used  . Alcohol Use: No  . Drug Use: No  . Sexually Active: Not on file   Other Topics Concern  . Not on file   Social History Narrative   Regular exercise-noCaffeine Use-unsure    ROS:  Constitutional: Denies fever, malaise, fatigue, headache or abrupt weight changes.  HEENT: Denies eye pain, eye redness, ear pain, ringing in the ears, wax buildup, runny nose, nasal congestion, bloody nose, or sore throat. Respiratory: Denies difficulty breathing, shortness of breath, cough or sputum production.   Cardiovascular: Denies chest pain, chest tightness, palpitations or swelling in the hands or feet.  Gastrointestinal: Denies  abdominal pain, bloating, constipation, diarrhea or blood in the stool.  GU: Denies frequency, urgency, pain with urination, blood in urine, odor or discharge. Musculoskeletal: Denies decrease in range of motion, difficulty with gait, muscle pain or joint pain and swelling.  Skin: Denies redness, rashes, lesions or ulcercations.  Neurological: Denies dizziness, difficulty with memory, difficulty with speech or problems with balance and coordination.   No other specific complaints in a complete review of systems (except as listed in HPI above).  PE:  BP 126/72  Pulse 91  Temp 97.9 F (36.6 C) (Oral)  Ht 4\' 11"  (1.499 m)  Wt 145 lb 12 oz (66.112 kg)  BMI 29.44 kg/m2  SpO2 95% Wt Readings from Last 3 Encounters:  09/11/12 145 lb 12 oz (66.112 kg)  07/17/12 147 lb (66.679 kg)  01/04/12 141 lb (63.957 kg)    General: Appears her stated age, overweight but well developed, well nourished in NAD. HEENT: Head: normal shape and size; Eyes: sclera white, no icterus, conjunctiva pink, PERRLA and EOMs intact; Ears: Tm's gray and intact, normal light reflex; Nose: mucosa pink and moist, septum midline; Throat/Mouth: Teeth present, mucosa pink and moist, no lesions or ulcerations noted.  Neck: Normal range of motion. Neck supple, trachea midline. No massses, lumps or thyromegaly present.  Cardiovascular: Normal rate and rhythm. S1,S2 noted.  No murmur, rubs or gallops noted. No JVD or BLE edema. No carotid bruits noted. Pulmonary/Chest: Normal effort and positive vesicular breath sounds. No respiratory distress. No wheezes, rales or ronchi noted.  Abdomen: Soft and nontender. Normal bowel sounds, no bruits noted. No distention or masses noted. Liver, spleen and kidneys non palpable. Musculoskeletal: Normal range of motion. No signs of joint swelling. No difficulty with gait.  Neurological: Alert and oriented. Cranial nerves II-XII intact. Coordination normal. +DTRs bilaterally. Psychiatric: Mood and  affect normal. Behavior is normal. Judgment and thought content normal.   EKG:  BMET    Component Value Date/Time   NA 134* 07/17/2012 0950   K 4.5 07/17/2012 0950   CL 100 07/17/2012 0950   CO2 27 07/17/2012 0950   GLUCOSE 83 07/17/2012 0950   BUN 9 07/17/2012 0950   CREATININE 0.70 07/17/2012 0950   CREATININE 0.8  11/28/2007 2258   CALCIUM 9.2 07/17/2012 0950    Lipid Panel     Component Value Date/Time   CHOL 190 07/17/2012 0950   TRIG 125 07/17/2012 0950   HDL 40 07/17/2012 0950   CHOLHDL 4.8 07/17/2012 0950   VLDL 25 07/17/2012 0950   LDLCALC 125* 07/17/2012 0950    CBC    Component Value Date/Time   WBC 7.3 07/17/2012 0950   RBC 4.01 07/17/2012 0950   HGB 13.2 07/17/2012 0950   HCT 38.8 07/17/2012 0950   PLT 417* 07/17/2012 0950   MCV 96.8 07/17/2012 0950   MCH 32.9 07/17/2012 0950   MCHC 34.0 07/17/2012 0950   RDW 13.4 07/17/2012 0950   LYMPHSABS 2.6 07/17/2012 0950   MONOABS 0.7 07/17/2012 0950   EOSABS 0.4 07/17/2012 0950   BASOSABS 0.1 07/17/2012 0950    Hgb A1C No results found for this basename: HGBA1C     Assessment and Plan:  Preventative Health Care:  Start a diet and exercise program All HM UTD Will defer lab work at this visit Continue taking all currently prescribed meds.  RTC in 6 months

## 2012-09-11 NOTE — Patient Instructions (Signed)

## 2012-09-17 ENCOUNTER — Other Ambulatory Visit: Payer: Self-pay | Admitting: Internal Medicine

## 2012-09-19 ENCOUNTER — Telehealth: Payer: Self-pay | Admitting: *Deleted

## 2012-09-19 NOTE — Telephone Encounter (Signed)
Patient  request medication refill  on Benzonate. cb # [phone]

## 2012-09-21 ENCOUNTER — Telehealth: Payer: Self-pay | Admitting: *Deleted

## 2012-09-21 ENCOUNTER — Ambulatory Visit (INDEPENDENT_AMBULATORY_CARE_PROVIDER_SITE_OTHER): Payer: 59 | Admitting: Internal Medicine

## 2012-09-21 ENCOUNTER — Encounter: Payer: Self-pay | Admitting: Internal Medicine

## 2012-09-21 VITALS — BP 120/80 | HR 82 | Temp 97.9°F | Ht 59.0 in | Wt 148.8 lb

## 2012-09-21 DIAGNOSIS — J069 Acute upper respiratory infection, unspecified: Secondary | ICD-10-CM

## 2012-09-21 MED ORDER — BENZONATATE 100 MG PO CAPS: 100.0000 mg | ORAL_CAPSULE | Freq: Three times a day (TID) | ORAL | Status: DC | PRN

## 2012-09-21 MED ORDER — AZITHROMYCIN 250 MG PO TABS: ORAL_TABLET | ORAL | Status: DC

## 2012-09-21 NOTE — Telephone Encounter (Signed)
Left smg on triage stating NP refilled her benzonatate, but the directions is wrong. She states she was taking med 2 pills three times a day. Want to know why directions was change...Sue Green

## 2012-09-21 NOTE — Progress Notes (Signed)
HPI  Pt presents to the clinic today with c/o cough. She has had this cough for about 4 days. It is dry an unproductive. Sometimes she feels like she has a hard time catching her breath. She has taken tessalon pearls which have helped some. Nothing makes it worse. She does have a history of asthma and allergies. She denies fever, chills, body aches, nausea and vomiting.  Review of Systems    Past Medical History  Diagnosis Date  . Hypertension   . Anxiety   . Allergy   . GERD (gastroesophageal reflux disease)   . Asthma   . Thyroid disease     hypothyroidism  . Fibromyalgia   . Osteopenia   . Chronic tension headaches   . Internal hemorrhoids   . Depression   . Glaucoma   . Arthritis     Family History  Problem Relation Age of Onset  . Diabetes Mother   . Arthritis Mother   . Cancer Father     Pancreatic  . Diabetes Father     History   Social History  . Marital Status: Divorced    Spouse Name: N/A    Number of Children: N/A  . Years of Education: 16   Occupational History  . Retired    Social History Main Topics  . Smoking status: Never Smoker   . Smokeless tobacco: Never Used  . Alcohol Use: No  . Drug Use: No  . Sexually Active: Not on file   Other Topics Concern  . Not on file   Social History Narrative   Regular exercise-noCaffeine Use-unsure    Allergies  Allergen Reactions  . Penicillins Hives  . Doxycycline Nausea Only  . Diphenhydramine      Constitutional: Denies headache, fatigue, fever or abrupt weight changes.  HEENT:  Positive sore throat. Denies eye redness, eye pain, pressure behind the eyes, facial pain, nasal congestion  ear pain, ringing in the ears, wax buildup, runny nose or bloody nose. Respiratory: Positive cough. Denies difficulty breathing or shortness of breath.  Cardiovascular: Denies chest pain, chest tightness, palpitations or swelling in the hands or feet.   No other specific complaints in a complete review of  systems (except as listed in HPI above).  Objective:    General: Appears her stated age, well developed, well nourished in NAD. HEENT: Head: normal shape and size; Eyes: sclera white, no icterus, conjunctiva pink, PERRLA and EOMs intact; Ears: Tm's gray and intact, normal light reflex; Nose: mucosa pink and moist, septum midline; Throat/Mouth: + PND. Teeth present, mucosa pink and moist, no exudate noted, no lesions or ulcerations noted.  Neck: Neck supple, trachea midline. No massses, lumps or thyromegaly present.  Cardiovascular: Normal rate and rhythm. S1,S2 noted.  No murmur, rubs or gallops noted. No JVD or BLE edema. No carotid bruits noted. Pulmonary/Chest: Normal effort and positive vesicular breath sounds. No respiratory distress. No wheezes, rales or ronchi noted.      Assessment & Plan:  Upper Respiratory Infection, new onset with additional workup required:  eRx for Tessalon Pearls eRx for azithromax x 5 days  RTC as needed or if symptoms persist.

## 2012-09-21 NOTE — Patient Instructions (Addendum)

## 2012-09-21 NOTE — Telephone Encounter (Signed)
Lucy, I refilled it straight from the original script. It is fine if she takes 2 pills TID. We can change the script. Rene Kocher

## 2012-09-21 NOTE — Telephone Encounter (Signed)
Notified pt with Regina response...lmb 

## 2012-09-25 ENCOUNTER — Other Ambulatory Visit: Payer: Self-pay | Admitting: Internal Medicine

## 2012-09-25 NOTE — Telephone Encounter (Signed)
Caller: Sheylin/Patient; Phone: [phone]; Reason for Call: Patient took her last Azithromycin today and asking for a refill.  She still has some cough, no fever.  Advised that this medication will still be in her system for up to  2 weeks but she wants additional medication.

## 2012-09-26 ENCOUNTER — Telehealth: Payer: Self-pay | Admitting: *Deleted

## 2012-09-26 ENCOUNTER — Telehealth: Payer: Self-pay | Admitting: Internal Medicine

## 2012-09-26 NOTE — Telephone Encounter (Signed)
Ash, As I recommended earlier, if she is not better she should be seen in the office. I am not going to refill the antibiotic without assessing her. Rene Kocher

## 2012-09-26 NOTE — Telephone Encounter (Signed)
Forward  78 pages from Dr. Lenord Fellers to Dr. Nicki Reaper for [facility] 09-26-12 ym

## 2012-09-26 NOTE — Telephone Encounter (Signed)
Pt was advised from [facility] RN that Azithromycin rx will still be in her system for up to 2 weeks, pt wants to know if it is two weeks from first pill taken or two weeks from last pill taken.

## 2012-09-26 NOTE — Telephone Encounter (Signed)
Patient called in, I let her know of Regina's recommendations, now she is requesting a call back from [place] this morning

## 2012-09-26 NOTE — Telephone Encounter (Signed)
Appointment scheduled 09/30/2011 2:00pm.

## 2012-09-26 NOTE — Telephone Encounter (Signed)
Pt informed of NP's advisement. Pt transferred to scheduler for appointment.

## 2012-09-26 NOTE — Telephone Encounter (Signed)
Ash, The cough may linger for another week. I am not going to refill the azithromax. She needs to use her tessalon pearls for a few more days, then if she is not better, she needs to be seen in the office again to reevaluate additional antibiotic needs. Rene Kocher

## 2012-09-26 NOTE — Telephone Encounter (Signed)
Ash, Two weeks from the last pill.  Rene Kocher

## 2012-09-26 NOTE — Telephone Encounter (Signed)
Pt informed of NP's advisement.  

## 2012-09-27 ENCOUNTER — Other Ambulatory Visit: Payer: Self-pay | Admitting: Internal Medicine

## 2012-09-29 ENCOUNTER — Ambulatory Visit: Payer: 59 | Admitting: Internal Medicine

## 2012-09-29 ENCOUNTER — Other Ambulatory Visit: Payer: Self-pay | Admitting: *Deleted

## 2012-09-29 MED ORDER — BENZONATATE 100 MG PO CAPS: 100.0000 mg | ORAL_CAPSULE | Freq: Three times a day (TID) | ORAL | Status: DC | PRN

## 2012-09-29 MED ORDER — BENZONATATE 100 MG PO CAPS: 200.0000 mg | ORAL_CAPSULE | Freq: Three times a day (TID) | ORAL | Status: DC | PRN

## 2012-09-29 NOTE — Telephone Encounter (Signed)
Pt informed rx corrected and sent to pharmacy as requested.

## 2012-09-29 NOTE — Telephone Encounter (Signed)
R'cd call from [facility] for refill of Tessalon. Rx sent.

## 2012-09-29 NOTE — Telephone Encounter (Addendum)
Pt wants rx for Benzonatate changed to 2 capsules three times a day instead of 1 capsule tid. Pt states that previous MD prescribed this for her and she has been taking this medication for years. She also wants refills. Per Nicki Reaper, NP-C okay to change rx.

## 2012-10-09 ENCOUNTER — Telehealth: Payer: Self-pay | Admitting: Internal Medicine

## 2012-10-09 ENCOUNTER — Ambulatory Visit (INDEPENDENT_AMBULATORY_CARE_PROVIDER_SITE_OTHER): Payer: 59 | Admitting: Internal Medicine

## 2012-10-09 ENCOUNTER — Encounter: Payer: Self-pay | Admitting: Internal Medicine

## 2012-10-09 VITALS — BP 122/80 | HR 79 | Temp 97.8°F | Ht 59.0 in | Wt 151.4 lb

## 2012-10-09 DIAGNOSIS — R32 Unspecified urinary incontinence: Secondary | ICD-10-CM

## 2012-10-09 DIAGNOSIS — J069 Acute upper respiratory infection, unspecified: Secondary | ICD-10-CM

## 2012-10-09 MED ORDER — OXYBUTYNIN 3.9 MG/24HR TD PTTW: 1.0000 | MEDICATED_PATCH | TRANSDERMAL | Status: DC

## 2012-10-09 MED ORDER — LEVOFLOXACIN 500 MG PO TABS: 500.0000 mg | ORAL_TABLET | Freq: Every day | ORAL | Status: DC

## 2012-10-09 NOTE — Patient Instructions (Signed)

## 2012-10-09 NOTE — Telephone Encounter (Signed)
Pt had called with questions about side effect of tendon rupture from Levofloxacin. Pt informed that this side effect is rare. Discussed benefits and risk of continuing medication. Offered option to change to a different class of antibiotics.  Pt declined. All questions answered.

## 2012-10-09 NOTE — Progress Notes (Signed)
HPI  Pt presents to the clinic today with c/o cold symptoms x 2 weeks. The worst part is the sore throat and dry cough. she does not produce any sputum. He was running fevers last week but none this week. She has tried Robitussin, Mucinex, cough drops and nothing seems to help. She does have sick contacts. Additionally, she does complain that the insurance will not cover her Vesicare anymore and is wondering if it could be changed to something else.  Review of Systems      Past Medical History  Diagnosis Date  . Hypertension   . Anxiety   . Allergy   . GERD (gastroesophageal reflux disease)   . Asthma   . Thyroid disease     hypothyroidism  . Fibromyalgia   . Osteopenia   . Chronic tension headaches   . Internal hemorrhoids   . Depression   . Glaucoma   . Arthritis     Family History  Problem Relation Age of Onset  . Diabetes Mother   . Arthritis Mother   . Cancer Father     Pancreatic  . Diabetes Father     History   Social History  . Marital Status: Divorced    Spouse Name: N/A    Number of Children: N/A  . Years of Education: 16   Occupational History  . Retired    Social History Main Topics  . Smoking status: Never Smoker   . Smokeless tobacco: Never Used  . Alcohol Use: No  . Drug Use: No  . Sexually Active: Not on file   Other Topics Concern  . Not on file   Social History Narrative   Regular exercise-noCaffeine Use-unsure    Allergies  Allergen Reactions  . Penicillins Hives  . Doxycycline Nausea Only  . Diphenhydramine      Constitutional: Positive headache, fatigue and fever. Denies abrupt weight changes.  HEENT:  Positive sore throat. Denies eye redness, eye pain, pressure behind the eyes, facial pain, nasal congestion, ear pain, ringing in the ears, wax buildup, runny nose or bloody nose. Respiratory: Positive cough. Denies difficulty breathing or shortness of breath.  Cardiovascular: Denies chest pain, chest tightness, palpitations  or swelling in the hands or feet.   No other specific complaints in a complete review of systems (except as listed in HPI above).  Objective:   BP 122/80  Pulse 79  Temp 97.8 F (36.6 C) (Oral)  Ht 4\' 11"  (1.499 m)  Wt 151 lb 6.4 oz (68.675 kg)  BMI 30.58 kg/m2  SpO2 98% Wt Readings from Last 3 Encounters:  10/09/12 151 lb 6.4 oz (68.675 kg)  09/21/12 148 lb 12 oz (67.473 kg)  09/11/12 145 lb 12 oz (66.112 kg)     General: Appears her stated age, well developed, well nourished in NAD. HEENT: Head: normal shape and size; Eyes: sclera white, no icterus, conjunctiva pink, PERRLA and EOMs intact; Ears: Tm's gray and intact, normal light reflex; Nose: mucosa pink and moist, septum midline; Throat/Mouth: + PND. Teeth present, mucosa erythematous and moist, no exudate noted, no lesions or ulcerations noted.  Neck:  Neck supple, trachea midline. No massses, lumps or thyromegaly present.  Cardiovascular: Normal rate and rhythm. S1,S2 noted.  No murmur, rubs or gallops noted. No JVD or BLE edema. No carotid bruits noted. Pulmonary/Chest: Normal effort and positive vesicular breath sounds. No respiratory distress. No wheezes, rales or ronchi noted.      Assessment & Plan:   Upper Respiratory Infection, new  onset with additional workup required:  Get some rest and drink plenty of water Do salt water gargles for the sore throat eRx for Levaquin x 7 days Continue tessalon pearls  Overactive bladder:  Stop Vesicare due to insurance change eRx for Oxytrol patches    RTC as needed or if symptoms persist.

## 2012-10-09 NOTE — Telephone Encounter (Signed)
Pt was seen in the office on 10/09/12 for URI symptoms.  Pt states she was prescribed Levaquin for her symptoms.  Pt states she has been reading the paperwork that came with the medication and one of the side effects was tendon rupture or tendon swelling.  Pt states she suffers from arthritis and bad knees and is really afraid to take this medication without speaking with NP Baity.  OFFICE COULD YOU PLEASE FOLLOW UP WITH PATIENT REGARDING MEDICATION.

## 2012-10-12 ENCOUNTER — Telehealth: Payer: Self-pay | Admitting: Internal Medicine

## 2012-10-12 NOTE — Telephone Encounter (Signed)
Patient Information:  Caller Name: Angie  Phone: [phone]  Patient: Sue Green, Sue Green  Gender: Female  DOB: 1946/06/17  Age: 67 Years  PCP: Nicki Reaper  Office Follow Up:  Does the office need to follow up with this patient?: Yes  Instructions For The Office: Please contact patient about the Levaquin/ Abdominal pain  RN Note:  Advised to hold the Levaquin until reviewed by the physician. Health Education reviewed on side effects of Levaquin.   Please constact patient - [phone] to discuss use of the medication.  Symptoms  Reason For Call & Symptoms: Patient she has been having issues with a cough. She saw Nicki Reaper NP on 09/21/12 . Treated with Zithromax Tessalon Pearls and she did not get any better. Patient was seen again, 10/09/12 for lingering cough and was prescribed Levaquin. She has had X4 doses thus far.   She states the cough is better but her stomach is aching and  bloating,  +flatus, no loose stools or diarrhea, no nausea. Location "around the waist".  Reviewed Health History In EMR: Yes  Reviewed Medications In EMR: Yes  Reviewed Allergies In EMR: Yes  Reviewed Surgeries / Procedures: No  Date of Onset of Symptoms: 10/11/2012  Treatments Tried: levaquin  Treatments Tried Worked: No  Guideline(s) Used:  Abdominal Pain - Female  Disposition Per Guideline:   Home Care  Reason For Disposition Reached:   Mild abdominal pain  Advice Given:  Call Back If:  Abdominal pain is constant and present for more than 2 hours  Abdominal pains come and go and are present for more than 24 hours  You are pregnant  You become worse.  Rest:  Lie down and rest until you feel better.  Reassurance:  A mild stomachache can be caused by indigestion, gas pains or overeating. Sometimes a stomachache signals the onset of a vomiting illness due to a viral gastroenteritis ("stomach flu").  Here is some care advice that should help.  Fluids:  Sip clear fluids only (e.g., water, flat  soft drinks or 1/2 strength fruit juice) until the pain has been gone for over 2 hours. Then slowly return to a regular diet.  Diet:  Slowly advance diet from clear liquids to a bland diet  Avoid alcohol or caffeinated beverages  Avoid greasy or fatty foods.  Pass a BM:  Sit on the toilet and try to pass a bowel movement (BM). Do not strain. This may relieve the pain if it is due to constipation or impending diarrhea.

## 2012-10-13 NOTE — Telephone Encounter (Signed)
Ash, Abdominal pain is a side effect of Levaquin. IT can be very harsh on the stomach. She can try to take it with food to see if that helps. The abdominal pain will go away  When she stops the medication. I would advise that if the cough is getting better while she is on the Levaquin and if she can tolerate the abdominal pain for a few more days, then I think it would be a good idea to finish the course of abx. If not, she can stop it, but her cough may return. Rene Kocher

## 2012-10-13 NOTE — Telephone Encounter (Signed)
Pt states that she will stop taking the Levaquin because it is causing too many side effects.

## 2012-11-07 ENCOUNTER — Other Ambulatory Visit: Payer: Self-pay | Admitting: Internal Medicine

## 2012-11-11 ENCOUNTER — Other Ambulatory Visit: Payer: Self-pay | Admitting: Internal Medicine

## 2012-11-14 ENCOUNTER — Telehealth: Payer: Self-pay | Admitting: *Deleted

## 2012-11-14 MED ORDER — LEVOTHYROXINE SODIUM 75 MCG PO TABS: ORAL_TABLET | ORAL | Status: DC

## 2012-11-14 NOTE — Telephone Encounter (Signed)
Called pt back inform her her pharmacy actually sent renewal to Dr. Lenord Fellers. [facility].../lmb

## 2012-11-14 NOTE — Telephone Encounter (Signed)
Left msg on triage stating been trying to get refills on her synthroid meds. Pharmacy haven't received call back...lmb

## 2012-11-26 ENCOUNTER — Other Ambulatory Visit: Payer: Self-pay | Admitting: Internal Medicine

## 2012-12-06 ENCOUNTER — Other Ambulatory Visit: Payer: Self-pay | Admitting: Internal Medicine

## 2012-12-07 ENCOUNTER — Other Ambulatory Visit: Payer: Self-pay | Admitting: Internal Medicine

## 2012-12-08 ENCOUNTER — Other Ambulatory Visit: Payer: Self-pay | Admitting: Internal Medicine

## 2012-12-13 ENCOUNTER — Other Ambulatory Visit: Payer: Self-pay | Admitting: Internal Medicine

## 2012-12-13 NOTE — Telephone Encounter (Signed)
Pt req refill for [facility]ukast [facility]mg  [facility] [address]. Please call pt if this is ok.

## 2012-12-20 ENCOUNTER — Other Ambulatory Visit: Payer: Self-pay | Admitting: Internal Medicine

## 2013-01-06 ENCOUNTER — Other Ambulatory Visit: Payer: Self-pay | Admitting: Internal Medicine

## 2013-01-09 ENCOUNTER — Other Ambulatory Visit: Payer: Self-pay | Admitting: Internal Medicine

## 2013-01-12 ENCOUNTER — Other Ambulatory Visit: Payer: Self-pay | Admitting: Internal Medicine

## 2013-01-16 ENCOUNTER — Other Ambulatory Visit: Payer: Self-pay | Admitting: Internal Medicine

## 2013-01-18 ENCOUNTER — Ambulatory Visit (INDEPENDENT_AMBULATORY_CARE_PROVIDER_SITE_OTHER): Payer: 59 | Admitting: Internal Medicine

## 2013-01-18 ENCOUNTER — Encounter: Payer: Self-pay | Admitting: Internal Medicine

## 2013-01-18 ENCOUNTER — Ambulatory Visit (INDEPENDENT_AMBULATORY_CARE_PROVIDER_SITE_OTHER)
Admission: RE | Admit: 2013-01-18 | Discharge: 2013-01-18 | Disposition: A | Discharge status: Home / Self Care | Payer: 59 | Source: Ambulatory Visit | Attending: Internal Medicine | Admitting: Internal Medicine

## 2013-01-18 VITALS — BP 154/82 | HR 81 | Temp 97.2°F | Ht 59.0 in | Wt 148.0 lb

## 2013-01-18 DIAGNOSIS — M545 Low back pain, unspecified: Secondary | ICD-10-CM

## 2013-01-18 DIAGNOSIS — M79605 Pain in left leg: Secondary | ICD-10-CM

## 2013-01-18 NOTE — Progress Notes (Signed)
Subjective:    Patient ID: Sue Green, female    DOB: 1946-05-05, 67 y.o.   MRN: [id number]  HPI  Pt presents to the clinic today with of pain on her left lower back side. The pain has been intermittent. It is a dull ache. It does not hurt worse with movement, bending or twisting. She has taken Aleve and put a heating pad on the area which does seem to help. Sometimes the pain radiates down into her left leg. She has had lower back pain in the past. She denies any specific back injury.   Review of Systems      Past Medical History  Diagnosis Date  . Hypertension   . Anxiety   . Allergy   . GERD (gastroesophageal reflux disease)   . Asthma   . Thyroid disease     hypothyroidism  . Fibromyalgia   . Osteopenia   . Chronic tension headaches   . Internal hemorrhoids   . Depression   . Glaucoma   . Arthritis     Current Outpatient Prescriptions  Medication Sig Dispense Refill  . Ascorbic Acid (VITAMIN C) 500 MG tablet Take 500 mg by mouth daily.        . benzonatate (TESSALON) 100 MG capsule TAKE 2 CAPSULES (200 MG TOTAL) BY MOUTH 3 (THREE) TIMES DAILY AS NEEDED FOR COUGH.  120 capsule  1  . CINNAMON PO Take 1 capsule by mouth 2 (two) times daily. 1000mg       . Coenzyme Q10 200 MG capsule Take 200 mg by mouth daily.      . [facility] STOOL SOFTENER 100 MG capsule TAKE 1 CAPSULE BY MOUTH EVERY DAY  30 each  12  . cyclobenzaprine (FLEXERIL) 10 MG tablet TAKE 1 TABLET (10 MG TOTAL) BY MOUTH 1 DAY OR 1 DOSE.  30 tablet  5  . desvenlafaxine (PRISTIQ) 100 MG 24 hr tablet Take 100 mg by mouth daily.        Marland Kitchen glucosamine-chondroitin 500-400 MG tablet Take 1 tablet by mouth 2 (two) times daily.       Marland Kitchen levothyroxine (SYNTHROID) 75 MCG tablet TAKE 1 TABLET (75 MCG TOTAL) BY MOUTH DAILY.  30 tablet  11  . LORazepam (ATIVAN) 1 MG tablet Take 1 mg by mouth 2 (two) times daily as needed.        . Methylcellulose, Laxative, (CITRUCEL) 500 MG TABS Take by mouth daily.      . metoprolol  succinate (TOPROL-XL) 50 MG 24 hr tablet TAKE 1 TABLET EVERY DAY  30 tablet  12  . montelukast (SINGULAIR) 10 MG tablet TAKE 1 TABLET BY MOUTH EVERY DAY  30 tablet  12  . Multiple Vitamins-Minerals (ONE-A-DAY WOMENS 50+ ADVANTAGE) TABS Take 1 tablet by mouth daily.      . naproxen sodium (ALEVE) 220 MG tablet Take 220 mg by mouth daily.       . Omega-3 Fatty Acids (FISH OIL) 1200 MG CAPS Take 1 capsule by mouth 3 (three) times daily.      . pantoprazole (PROTONIX) 40 MG tablet TAKE 1 TABLET EVERY DAY  30 tablet  11  . polyethylene glycol powder (GLYCOLAX/MIRALAX) powder TAKE 17GRAMS (1 CAPFUL) IN 8 OUNCES OF WATER ONCE DAILY  527 g  10  . pramoxine (SARNA SENSITIVE) 1 % LOTN Apply topically 2 (two) times daily.      . VESICARE 5 MG tablet TAKE ONE TABLET BY MOUTH ONCE DAILY  30 tablet  12  .  vitamin B-12 (CYANOCOBALAMIN) 1000 MCG tablet Take 1,000 mcg by mouth daily.      . Calcium-Vitamin D-Vitamin K (CALCIUM + D) [phone] MG-UNT-MCG CHEW Chew 1 tablet by mouth 2 (two) times daily.      Marland Kitchen oxybutynin (OXYTROL) 3.9 MG/24HR Place 1 patch onto the skin 2 (two) times a week.  8 patch  12   No current facility-administered medications for this visit.    Allergies  Allergen Reactions  . Penicillins Hives  . Doxycycline Nausea Only  . Diphenhydramine     Family History  Problem Relation Age of Onset  . Diabetes Mother   . Arthritis Mother   . Cancer Father     Pancreatic  . Diabetes Father     History   Social History  . Marital Status: Divorced    Spouse Name: N/A    Number of Children: N/A  . Years of Education: 16   Occupational History  . Retired    Social History Main Topics  . Smoking status: Never Smoker   . Smokeless tobacco: Never Used  . Alcohol Use: No  . Drug Use: No  . Sexually Active: Not on file   Other Topics Concern  . Not on file   Social History Narrative   Regular exercise-no   Caffeine Use-unsure     Constitutional: Denies fever, malaise,  fatigue, headache or abrupt weight changes.  GU: Denies urgency, frequency, pain with urination, burning sensation, blood in urine, odor or discharge. Musculoskeletal: Pt reports left lower back pain. Denies decrease in range of motion, difficulty with gait, or joint pain and swelling.  Neurological: Denies dizziness, difficulty with memory, difficulty with speech or problems with balance and coordination.   No other specific complaints in a complete review of systems (except as listed in HPI above).  Objective:   Physical Exam   BP 154/82  Pulse 81  Temp(Src) 97.2 F (36.2 C) (Oral)  Ht 4\' 11"  (1.499 m)  Wt 148 lb (67.132 kg)  BMI 29.88 kg/m2  SpO2 97% Wt Readings from Last 3 Encounters:  01/18/13 148 lb (67.132 kg)  10/09/12 151 lb 6.4 oz (68.675 kg)  09/21/12 148 lb 12 oz (67.473 kg)    General: Appears her stated age, well developed, well nourished in NAD.  Cardiovascular: Normal rate and rhythm. S1,S2 noted.  No murmur, rubs or gallops noted. No JVD or BLE edema. No carotid bruits noted. Pulmonary/Chest: Normal effort and positive vesicular breath sounds. No respiratory distress. No wheezes, rales or ronchi noted.  Musculoskeletal: Normal range of motion. No signs of joint swelling. No difficulty with gait. No CVA tenderness Neurological: Alert and oriented. Cranial nerves II-XII intact. Coordination normal. +DTRs bilaterally. Negative straight leg raise.       Assessment & Plan:   Low back pain on the left side with pain radiating down left leg, ? musculoskeletal in origin:  Will obtain xray of lumber spine to r/o stenosis with radiculopathy Given no urinary signs, will hold off on urinalysis Continue Aleve and heating pad at this time  Will f/u with you after results are back. If symptoms get worse or you become incontinent of bowel or bladder, please RTC asap.

## 2013-01-18 NOTE — Patient Instructions (Signed)
Back Pain, Adult Low back pain is very common. About 1 in 5 people have back pain.The cause of low back pain is rarely dangerous. The pain often gets better over time.About half of people with a sudden onset of back pain feel better in just 2 weeks. About 8 in 10 people feel better by 6 weeks.  CAUSES Some common causes of back pain include:  Strain of the muscles or ligaments supporting the spine.  Wear and tear (degeneration) of the spinal discs.  Arthritis.  Direct injury to the back. DIAGNOSIS Most of the time, the direct cause of low back pain is not known.However, back pain can be treated effectively even when the exact cause of the pain is unknown.Answering your caregiver's questions about your overall health and symptoms is one of the most accurate ways to make sure the cause of your pain is not dangerous. If your caregiver needs more information, he or she may order lab work or imaging tests (X-rays or MRIs).However, even if imaging tests show changes in your back, this usually does not require surgery. HOME CARE INSTRUCTIONS For many people, back pain returns.Since low back pain is rarely dangerous, it is often a condition that people can learn to manageon their own.   Remain active. It is stressful on the back to sit or stand in one place. Do not sit, drive, or stand in one place for more than 30 minutes at a time. Take short walks on level surfaces as soon as pain allows.Try to increase the length of time you walk each day.  Do not stay in bed.Resting more than 1 or 2 days can delay your recovery.  Do not avoid exercise or work.Your body is made to move.It is not dangerous to be active, even though your back may hurt.Your back will likely heal faster if you return to being active before your pain is gone.  Pay attention to your body when you bend and lift. Many people have less discomfortwhen lifting if they bend their knees, keep the load close to their bodies,and  avoid twisting. Often, the most comfortable positions are those that put less stress on your recovering back.  Find a comfortable position to sleep. Use a firm mattress and lie on your side with your knees slightly bent. If you lie on your back, put a pillow under your knees.  Only take over-the-counter or prescription medicines as directed by your caregiver. Over-the-counter medicines to reduce pain and inflammation are often the most helpful.Your caregiver may prescribe muscle relaxant drugs.These medicines help dull your pain so you can more quickly return to your normal activities and healthy exercise.  Put ice on the injured area.  Put ice in a plastic bag.  Place a towel between your skin and the bag.  Leave the ice on for 15 to 20 minutes, 3 to 4 times a day for the first 2 to 3 days. After that, ice and heat may be alternated to reduce pain and spasms.  Ask your caregiver about trying back exercises and gentle massage. This may be of some benefit.  Avoid feeling anxious or stressed.Stress increases muscle tension and can worsen back pain.It is important to recognize when you are anxious or stressed and learn ways to manage it.Exercise is a great option. SEEK MEDICAL CARE IF:  You have pain that is not relieved with rest or medicine.  You have pain that does not improve in 1 week.  You have new symptoms.  You are generally   not feeling well. SEEK IMMEDIATE MEDICAL CARE IF:   You have pain that radiates from your back into your legs.  You develop new bowel or bladder control problems.  You have unusual weakness or numbness in your arms or legs.  You develop nausea or vomiting.  You develop abdominal pain.  You feel faint. Document Released: 09/06/2005 Document Revised: 03/07/2012 Document Reviewed: 01/25/2011 [employer] Patient Information 2013 [employer].  

## 2013-01-24 ENCOUNTER — Other Ambulatory Visit: Payer: Self-pay | Admitting: Internal Medicine

## 2013-03-02 ENCOUNTER — Other Ambulatory Visit: Payer: Self-pay | Admitting: Internal Medicine

## 2013-03-12 ENCOUNTER — Ambulatory Visit (INDEPENDENT_AMBULATORY_CARE_PROVIDER_SITE_OTHER): Payer: 59 | Admitting: Internal Medicine

## 2013-03-12 ENCOUNTER — Encounter: Payer: Self-pay | Admitting: Internal Medicine

## 2013-03-12 VITALS — BP 132/62 | HR 74 | Ht 59.0 in | Wt 146.5 lb

## 2013-03-12 DIAGNOSIS — R3915 Urgency of urination: Secondary | ICD-10-CM

## 2013-03-12 DIAGNOSIS — F341 Dysthymic disorder: Secondary | ICD-10-CM

## 2013-03-12 DIAGNOSIS — K219 Gastro-esophageal reflux disease without esophagitis: Secondary | ICD-10-CM

## 2013-03-12 DIAGNOSIS — R059 Cough, unspecified: Secondary | ICD-10-CM

## 2013-03-12 DIAGNOSIS — I10 Essential (primary) hypertension: Secondary | ICD-10-CM

## 2013-03-12 DIAGNOSIS — R05 Cough: Secondary | ICD-10-CM

## 2013-03-12 DIAGNOSIS — E039 Hypothyroidism, unspecified: Secondary | ICD-10-CM

## 2013-03-12 DIAGNOSIS — R053 Chronic cough: Secondary | ICD-10-CM

## 2013-03-12 DIAGNOSIS — F419 Anxiety disorder, unspecified: Secondary | ICD-10-CM

## 2013-03-12 DIAGNOSIS — F32A Depression, unspecified: Secondary | ICD-10-CM

## 2013-03-12 NOTE — Assessment & Plan Note (Signed)
Will switch back to [employer]

## 2013-03-12 NOTE — Progress Notes (Signed)
Subjective:    Patient ID: Sue Green, female    DOB: March 26, 1946, 67 y.o.   MRN: [id number]  HPI  Pt presents to the clinic today for 6 month f/u of chronic medical conditions.  HTN- BP is usually less than 140/75. Well controlled on Toprol.  Anxiety and Depression- Well controlled on Pristiq and Ativan. Pt reports no need for therapy at this time. GERD: Well controlled on protonix. Will take Tums as needed. Hypothyroidism: on synthroid, labs last check 07/2012. Urinary Incontinence: Switched to oxytrol patches due to cost of vesicare. She does not think it is effective. She would like to switch back to vesicare.   Review of Systems      Past Medical History  Diagnosis Date  . Hypertension   . Anxiety   . Allergy   . GERD (gastroesophageal reflux disease)   . Asthma   . Thyroid disease     hypothyroidism  . Fibromyalgia   . Osteopenia   . Chronic tension headaches   . Internal hemorrhoids   . Depression   . Glaucoma   . Arthritis     Current Outpatient Prescriptions  Medication Sig Dispense Refill  . Ascorbic Acid (VITAMIN C) 500 MG tablet Take 500 mg by mouth daily.        . benzonatate (TESSALON) 100 MG capsule TAKE 2 CAPSULES (200 MG TOTAL) BY MOUTH 3 (THREE) TIMES DAILY AS NEEDED FOR COUGH.  120 capsule  2  . Calcium-Vitamin D-Vitamin K (CALCIUM + D) [phone] MG-UNT-MCG CHEW Chew 1 tablet by mouth 2 (two) times daily.      Marland Kitchen CINNAMON PO Take 1 capsule by mouth 2 (two) times daily. 1000mg       . Coenzyme Q10 200 MG capsule Take 200 mg by mouth daily.      . [facility] STOOL SOFTENER 100 MG capsule TAKE 1 CAPSULE BY MOUTH EVERY DAY  30 each  12  . cyclobenzaprine (FLEXERIL) 10 MG tablet TAKE 1 TABLET (10 MG TOTAL) BY MOUTH 1 DAY OR 1 DOSE.  30 tablet  5  . desvenlafaxine (PRISTIQ) 100 MG 24 hr tablet Take 100 mg by mouth daily.        Marland Kitchen glucosamine-chondroitin 500-400 MG tablet Take 1 tablet by mouth 2 (two) times daily.       Marland Kitchen levothyroxine (SYNTHROID) 75 MCG  tablet TAKE 1 TABLET (75 MCG TOTAL) BY MOUTH DAILY.  30 tablet  11  . LORazepam (ATIVAN) 1 MG tablet Take 1 mg by mouth 2 (two) times daily as needed.        . Methylcellulose, Laxative, (CITRUCEL) 500 MG TABS Take by mouth daily.      . metoprolol succinate (TOPROL-XL) 50 MG 24 hr tablet TAKE 1 TABLET EVERY DAY  30 tablet  12  . montelukast (SINGULAIR) 10 MG tablet TAKE 1 TABLET BY MOUTH EVERY DAY  30 tablet  12  . Multiple Vitamins-Minerals (ONE-A-DAY WOMENS 50+ ADVANTAGE) TABS Take 1 tablet by mouth daily.      . naproxen sodium (ALEVE) 220 MG tablet Take 220 mg by mouth daily.       . Omega-3 Fatty Acids (FISH OIL) 1200 MG CAPS Take 1 capsule by mouth 3 (three) times daily.      Marland Kitchen oxybutynin (OXYTROL) 3.9 MG/24HR Place 1 patch onto the skin 2 (two) times a week.  8 patch  12  . pantoprazole (PROTONIX) 40 MG tablet TAKE 1 TABLET EVERY DAY  30 tablet  11  . polyethylene  glycol powder (GLYCOLAX/MIRALAX) powder TAKE 17GRAMS (1 CAPFUL) IN 8 OUNCES OF WATER ONCE DAILY  527 g  10  . pramoxine (SARNA SENSITIVE) 1 % LOTN Apply topically 2 (two) times daily.      . VESICARE 5 MG tablet TAKE ONE TABLET BY MOUTH ONCE DAILY  30 tablet  12  . vitamin B-12 (CYANOCOBALAMIN) 1000 MCG tablet Take 1,000 mcg by mouth daily.       No current facility-administered medications for this visit.    Allergies  Allergen Reactions  . Penicillins Hives  . Doxycycline Nausea Only  . Diphenhydramine     Family History  Problem Relation Age of Onset  . Diabetes Mother   . Arthritis Mother   . Cancer Father     Pancreatic  . Diabetes Father     History   Social History  . Marital Status: Divorced    Spouse Name: N/A    Number of Children: N/A  . Years of Education: 16   Occupational History  . Retired    Social History Main Topics  . Smoking status: Never Smoker   . Smokeless tobacco: Never Used  . Alcohol Use: No  . Drug Use: No  . Sexually Active: Not on file   Other Topics Concern  . Not on  file   Social History Narrative   Regular exercise-no   Caffeine Use-unsure     Constitutional: Denies fever, malaise, fatigue, headache or abrupt weight changes.  HEENT: Pt reports dry mouth. Denies eye pain, eye redness, ear pain, ringing in the ears, wax buildup, runny nose, nasal congestion, bloody nose, or sore throat. Respiratory: Pt reports chronic cough. Denies difficulty breathing, shortness of breath, or sputum production.   Cardiovascular: Denies chest pain, chest tightness, palpitations or swelling in the hands or feet.  Gastrointestinal: Pt reports constipation. Denies abdominal pain, bloating, diarrhea or blood in the stool.  GU: Pt reports incontinence. Denies urgency, frequency, pain with urination, burning sensation, blood in urine, odor or discharge. Musculoskeletal: Pt reports arthritis. Denies decrease in range of motion, difficulty with gait, muscle pain or joint swelling.  Skin: Denies redness, rashes, lesions or ulcercations.  Neurological: Denies dizziness, difficulty with memory, difficulty with speech or problems with balance and coordination.   No other specific complaints in a complete review of systems (except as listed in HPI above).  Objective:   Physical Exam   BP 132/62  Pulse 74  Ht 4\' 11"  (1.499 m)  Wt 146 lb 8 oz (66.452 kg)  BMI 29.57 kg/m2  SpO2 97% Wt Readings from Last 3 Encounters:  03/12/13 146 lb 8 oz (66.452 kg)  01/18/13 148 lb (67.132 kg)  10/09/12 151 lb 6.4 oz (68.675 kg)    General: Appears her stated age, overweight but well developed, well nourished in NAD. Skin: Warm, dry and intact. No rashes, lesions or ulcerations noted. HEENT: Head: normal shape and size; Eyes: sclera white, no icterus, conjunctiva pink, PERRLA and EOMs intact; Ears: Tm's gray and intact, normal light reflex; Nose: mucosa pink and moist, septum midline; Throat/Mouth: Teeth present, mucosa pink and moist, no exudate, lesions or ulcerations noted.  Neck:  Normal range of motion. Neck supple, trachea midline. No massses, lumps or thyromegaly present.  Cardiovascular: Normal rate and rhythm. S1,S2 noted.  No murmur, rubs or gallops noted. No JVD or BLE edema. No carotid bruits noted. Pulmonary/Chest: Normal effort and positive vesicular breath sounds. No respiratory distress. No wheezes, rales or ronchi noted.  Abdomen: Soft and  nontender. Normal bowel sounds, no bruits noted. No distention or masses noted. Liver, spleen and kidneys non palpable. Musculoskeletal: Normal range of motion. No signs of joint swelling. No difficulty with gait.  Neurological: Alert and oriented. Cranial nerves II-XII intact. Coordination normal. +DTRs bilaterally. Psychiatric: Mood and affect normal. Behavior is normal. Judgment and thought content normal.     BMET    Component Value Date/Time   NA 134* 07/17/2012 0950   K 4.5 07/17/2012 0950   CL 100 07/17/2012 0950   CO2 27 07/17/2012 0950   GLUCOSE 83 07/17/2012 0950   BUN 9 07/17/2012 0950   CREATININE 0.70 07/17/2012 0950   CREATININE 0.8 11/28/2007 2258   CALCIUM 9.2 07/17/2012 0950    Lipid Panel     Component Value Date/Time   CHOL 190 07/17/2012 0950   TRIG 125 07/17/2012 0950   HDL 40 07/17/2012 0950   CHOLHDL 4.8 07/17/2012 0950   VLDL 25 07/17/2012 0950   LDLCALC 125* 07/17/2012 0950    CBC    Component Value Date/Time   WBC 7.3 07/17/2012 0950   RBC 4.01 07/17/2012 0950   HGB 13.2 07/17/2012 0950   HCT 38.8 07/17/2012 0950   PLT 417* 07/17/2012 0950   MCV 96.8 07/17/2012 0950   MCH 32.9 07/17/2012 0950   MCHC 34.0 07/17/2012 0950   RDW 13.4 07/17/2012 0950   LYMPHSABS 2.6 07/17/2012 0950   MONOABS 0.7 07/17/2012 0950   EOSABS 0.4 07/17/2012 0950   BASOSABS 0.1 07/17/2012 0950    Hgb A1C No results found for this basename: HGBA1C        Assessment & Plan:

## 2013-03-12 NOTE — Assessment & Plan Note (Signed)
Well controlled on current medications Consider CBT in the future

## 2013-03-12 NOTE — Assessment & Plan Note (Signed)
Well controlled Continue benzonate as needed

## 2013-03-12 NOTE — Assessment & Plan Note (Signed)
Well controlled on Protonix Continue Tums as needed

## 2013-03-12 NOTE — Patient Instructions (Signed)
DASH Diet  The DASH diet stands for "Dietary Approaches to Stop Hypertension." It is a healthy eating plan that has been shown to reduce high blood pressure (hypertension) in as little as 14 days, while also possibly providing other significant health benefits. These other health benefits include reducing the risk of breast cancer after menopause and reducing the risk of type 2 diabetes, heart disease, colon cancer, and stroke. Health benefits also include weight loss and slowing kidney failure in patients with chronic kidney disease.   DIET GUIDELINES  · Limit salt (sodium). Your diet should contain less than 1500 mg of sodium daily.  · Limit refined or processed carbohydrates. Your diet should include mostly whole grains. Desserts and added sugars should be used sparingly.  · Include small amounts of heart-healthy fats. These types of fats include nuts, oils, and tub margarine. Limit saturated and trans fats. These fats have been shown to be harmful in the body.  CHOOSING FOODS   The following food groups are based on a 2000 calorie diet. See your Registered Dietitian for individual calorie needs.  Grains and Grain Products (6 to 8 servings daily)  · Eat More Often: Whole-wheat bread, brown rice, whole-grain or wheat pasta, quinoa, popcorn without added fat or salt (air popped).  · Eat Less Often: White bread, white pasta, white rice, cornbread.  Vegetables (4 to 5 servings daily)  · Eat More Often: Fresh, frozen, and canned vegetables. Vegetables may be raw, steamed, roasted, or grilled with a minimal amount of fat.  · Eat Less Often/Avoid: Creamed or fried vegetables. Vegetables in a cheese sauce.  Fruit (4 to 5 servings daily)  · Eat More Often: All fresh, canned (in natural juice), or frozen fruits. Dried fruits without added sugar. One hundred percent fruit juice (½ cup [237 mL] daily).  · Eat Less Often: Dried fruits with added sugar. Canned fruit in light or heavy syrup.  Lean Meats, Fish, and Poultry (2  servings or less daily. One serving is 3 to 4 oz [85-114 g]).  · Eat More Often: Ninety percent or leaner ground beef, tenderloin, sirloin. Round cuts of beef, chicken breast, turkey breast. All fish. Grill, bake, or broil your meat. Nothing should be fried.  · Eat Less Often/Avoid: Fatty cuts of meat, turkey, or chicken leg, thigh, or wing. Fried cuts of meat or fish.  Dairy (2 to 3 servings)  · Eat More Often: Low-fat or fat-free milk, low-fat plain or light yogurt, reduced-fat or part-skim cheese.  · Eat Less Often/Avoid: Milk (whole, 2%). Whole milk yogurt. Full-fat cheeses.  Nuts, Seeds, and Legumes (4 to 5 servings per week)  · Eat More Often: All without added salt.  · Eat Less Often/Avoid: Salted nuts and seeds, canned beans with added salt.  Fats and Sweets (limited)  · Eat More Often: Vegetable oils, tub margarines without trans fats, sugar-free gelatin. Mayonnaise and salad dressings.  · Eat Less Often/Avoid: Coconut oils, palm oils, butter, stick margarine, cream, half and half, cookies, candy, pie.  FOR MORE INFORMATION  The Dash Diet Eating Plan: www.dashdiet.org  Document Released: 08/26/2011 Document Revised: 11/29/2011 Document Reviewed: 08/26/2011  Exit[employer]® Patient Information ©2014 [employer].

## 2013-03-12 NOTE — Assessment & Plan Note (Signed)
Continue DASH diet Well controlled

## 2013-03-12 NOTE — Assessment & Plan Note (Addendum)
Will recheck labs today Continue Synthroid

## 2013-03-25 ENCOUNTER — Other Ambulatory Visit: Payer: Self-pay | Admitting: Gastroenterology

## 2013-03-29 ENCOUNTER — Telehealth: Payer: Self-pay | Admitting: Internal Medicine

## 2013-03-29 NOTE — Telephone Encounter (Signed)
That she's looking for.  Patient advised that she was wanting to know when her last physical exam was.  Per our records, last CPE was 07/02/2011.  Patient was seen for 6 mo follow up in 2013 but did not have a physical exam in 2013.  Patient is now followed by Dr. Nicki Reaper.  Patient advised to hold off on copying the records and she'll contact Dr. Janith Lima office tomorrow (7/11) and see if they have that documentation in the 78 pages that was forwarded to her office at the time of patient transfer.  Patient advised she will call ME back to let us know how to proceed from here.  Gave patient my name and advised that I'm here M-F and will be happy to assist with copying her physical exam from 2012 and send to Dr. Sampson Si if the doctor's office will request that specific information.  Patient also had question as to when she had a colonoscopy.  Advised it was 07/2011.  Patient then stated that Dr. Sampson Si found that information on her computer.  Patient will follow up with ME from this point on regarding any copying of records.  As of today, patient does NOT want any records copied.

## 2013-03-29 NOTE — Telephone Encounter (Signed)
Patient has called several times past 2 days requesting her entire file including paper records. 78 pages were sent to  when she changed doctors last year. Will speak with [employer] about this request. Pt has been quite insistent that this be done right away. Patient will need to submit request in writing and was advised that records are the property of [facility]. She wanted to come pick up the file itself. She was told she could have a copy but not the original file.

## 2013-04-01 ENCOUNTER — Other Ambulatory Visit: Payer: Self-pay | Admitting: Internal Medicine

## 2013-04-02 ENCOUNTER — Telehealth: Payer: Self-pay | Admitting: Internal Medicine

## 2013-04-02 ENCOUNTER — Telehealth: Payer: Self-pay | Admitting: *Deleted

## 2013-04-02 DIAGNOSIS — M8580 Other specified disorders of bone density and structure, unspecified site: Secondary | ICD-10-CM

## 2013-04-02 DIAGNOSIS — Z0000 Encounter for general adult medical examination without abnormal findings: Secondary | ICD-10-CM

## 2013-04-02 NOTE — Telephone Encounter (Signed)
CPX labs entered for upcoming appointment.

## 2013-04-02 NOTE — Telephone Encounter (Signed)
Message copied by Carin Primrose on Mon Apr 02, 2013  8:32 AM ------      Message from: Livingston Diones      Created: Fri Mar 30, 2013  1:36 PM       Pt have a cpe 07/31/13. Please put in lab work a week prior. Pt stated insurance will pay for it, they have been paying in the past  ------

## 2013-05-31 ENCOUNTER — Other Ambulatory Visit: Payer: Self-pay | Admitting: Internal Medicine

## 2013-06-30 ENCOUNTER — Other Ambulatory Visit: Payer: Self-pay | Admitting: Internal Medicine

## 2013-07-03 ENCOUNTER — Other Ambulatory Visit: Payer: Self-pay | Admitting: *Deleted

## 2013-07-03 MED ORDER — DOCUSATE SODIUM 100 MG PO CAPS: ORAL_CAPSULE | ORAL | Status: DC

## 2013-07-19 ENCOUNTER — Encounter: Payer: Self-pay | Admitting: Internal Medicine

## 2013-07-26 ENCOUNTER — Telehealth: Payer: Self-pay

## 2013-07-26 DIAGNOSIS — Z0000 Encounter for general adult medical examination without abnormal findings: Secondary | ICD-10-CM

## 2013-07-26 NOTE — Telephone Encounter (Signed)
CPX labs entered  

## 2013-07-31 ENCOUNTER — Encounter: Payer: Self-pay | Admitting: Internal Medicine

## 2013-07-31 ENCOUNTER — Ambulatory Visit (INDEPENDENT_AMBULATORY_CARE_PROVIDER_SITE_OTHER): Payer: Medicare Other | Admitting: Internal Medicine

## 2013-07-31 ENCOUNTER — Other Ambulatory Visit (INDEPENDENT_AMBULATORY_CARE_PROVIDER_SITE_OTHER): Payer: 59

## 2013-07-31 ENCOUNTER — Encounter: Payer: 59 | Admitting: Internal Medicine

## 2013-07-31 VITALS — BP 140/76 | HR 88 | Temp 97.1°F | Ht 59.0 in | Wt 146.0 lb

## 2013-07-31 DIAGNOSIS — Z0000 Encounter for general adult medical examination without abnormal findings: Secondary | ICD-10-CM

## 2013-07-31 DIAGNOSIS — Z23 Encounter for immunization: Secondary | ICD-10-CM

## 2013-07-31 DIAGNOSIS — I10 Essential (primary) hypertension: Secondary | ICD-10-CM

## 2013-07-31 DIAGNOSIS — Z136 Encounter for screening for cardiovascular disorders: Secondary | ICD-10-CM

## 2013-07-31 DIAGNOSIS — Z0001 Encounter for general adult medical examination with abnormal findings: Secondary | ICD-10-CM | POA: Insufficient documentation

## 2013-07-31 LAB — HEPATIC FUNCTION PANEL
AST: 21 U/L (ref 0–37)
Alkaline Phosphatase: 79 U/L (ref 39–117)
Total Bilirubin: 0.6 mg/dL (ref 0.3–1.2)

## 2013-07-31 LAB — URINALYSIS, ROUTINE W REFLEX MICROSCOPIC
Ketones, ur: NEGATIVE
Leukocytes, UA: NEGATIVE
Nitrite: NEGATIVE
RBC / HPF: NONE SEEN (ref 0–?)
Specific Gravity, Urine: 1.01 (ref 1.000–1.030)
pH: 8 (ref 5.0–8.0)

## 2013-07-31 LAB — CBC WITH DIFFERENTIAL/PLATELET
Basophils Relative: 0.3 % (ref 0.0–3.0)
Eosinophils Absolute: 0.3 10*3/uL (ref 0.0–0.7)
Eosinophils Relative: 3.2 % (ref 0.0–5.0)
HCT: 33.7 % — ABNORMAL LOW (ref 36.0–46.0)
Lymphs Abs: 2 10*3/uL (ref 0.7–4.0)
MCHC: 33 g/dL (ref 30.0–36.0)
MCV: 82.7 fl (ref 78.0–100.0)
Monocytes Absolute: 0.8 10*3/uL (ref 0.1–1.0)
Neutrophils Relative %: 63.6 % (ref 43.0–77.0)
Platelets: 426 10*3/uL — ABNORMAL HIGH (ref 150.0–400.0)
WBC: 8.6 10*3/uL (ref 4.5–10.5)

## 2013-07-31 LAB — BASIC METABOLIC PANEL
BUN: 12 mg/dL (ref 6–23)
Creatinine, Ser: 0.7 mg/dL (ref 0.4–1.2)
GFR: 90 mL/min (ref >60.00)
Potassium: 4.5 mEq/L (ref 3.5–5.1)

## 2013-07-31 LAB — LIPID PANEL
Cholesterol: 181 mg/dL (ref 0–200)
HDL: 38.5 mg/dL — ABNORMAL LOW (ref >39.00)
Total CHOL/HDL Ratio: 5
VLDL: 23.8 mg/dL (ref 0.0–40.0)

## 2013-07-31 LAB — TSH: TSH: 1.34 u[IU]/mL (ref 0.35–5.50)

## 2013-07-31 MED ORDER — BENZONATATE 100 MG PO CAPS: ORAL_CAPSULE | ORAL | Status: DC

## 2013-07-31 NOTE — Progress Notes (Signed)
Pre-visit discussion using our clinic review tool. No additional management support is needed unless otherwise documented below in the visit note.  

## 2013-07-31 NOTE — Patient Instructions (Addendum)
You had the new Prevnar pneumonia shot Please continue all other medications as before, and refills have been done if requested - the tessalon perle We will need to follow up on the iron test; if low, you will need to go back to Dr Russella Dar  Please continue your efforts at being more active, low cholesterol diet, and weight control. You are otherwise up to date with prevention measures today.  Please keep your appointments with your specialists as you may have planned  Please return in 1 year for your yearly visit, or sooner if needed, with Lab testing done 3-5 days before

## 2013-07-31 NOTE — Assessment & Plan Note (Signed)

## 2013-07-31 NOTE — Progress Notes (Signed)
Subjective:    Patient ID: Sue Green, female    DOB: [birth year], 67 y.o.   MRN: [id number]  HPI  Here for wellness and f/u;  Overall doing ok;  Pt denies CP, worsening SOB, DOE, wheezing, orthopnea, PND, worsening LE edema, palpitations, dizziness or syncope.  Pt denies neurological change such as new headache, facial or extremity weakness.  Pt denies polydipsia, polyuria, or low sugar symptoms. Pt states overall good compliance with treatment and medications, good tolerability, and has been trying to follow lower cholesterol diet.  Pt denies worsening depressive symptoms, suicidal ideation or panic. No fever, night sweats, wt loss, loss of appetite, or other constitutional symptoms.  Pt states good ability with ADL's, has low fall risk, home safety reviewed and adequate, no other significant changes in hearing or vision, and only occasionally active with exercise. Both parents with DM, no complaints. No active bleeding Past Medical History  Diagnosis Date  . Hypertension   . Anxiety   . Allergy   . GERD (gastroesophageal reflux disease)   . Asthma   . Thyroid disease     hypothyroidism  . Fibromyalgia   . Osteopenia   . Chronic tension headaches   . Internal hemorrhoids   . Depression   . Glaucoma   . Arthritis    Past Surgical History  Procedure Laterality Date  . Tonsillectomy and adenoidectomy      67 years old  . Shoulder arthroscopy  2001    rt shoulder  . Appendectomy  1975    reports that she has never smoked. She has never used smokeless tobacco. She reports that she does not drink alcohol or use illicit drugs. family history includes Arthritis in her mother; Cancer in her father; Diabetes in her father and mother. Allergies  Allergen Reactions  . Penicillins Hives  . Doxycycline Nausea Only  . Diphenhydramine    Current Outpatient Prescriptions on File Prior to Visit  Medication Sig Dispense Refill  . Ascorbic Acid (VITAMIN C) 500 MG tablet Take 500 mg by  mouth daily.        . Calcium-Vitamin D-Vitamin K (CALCIUM + D) [phone] MG-UNT-MCG CHEW Chew 1 tablet by mouth 2 (two) times daily.      Marland Kitchen CINNAMON PO Take 1 capsule by mouth 2 (two) times daily. 1000mg       . Coenzyme Q10 200 MG capsule Take 200 mg by mouth daily.      . cyclobenzaprine (FLEXERIL) 10 MG tablet TAKE 1 TABLET (10 MG TOTAL) BY MOUTH 1 DAY OR 1 DOSE.  30 tablet  5  . desvenlafaxine (PRISTIQ) 100 MG 24 hr tablet Take 100 mg by mouth daily.        Marland Kitchen docusate sodium ([employer]S STOOL SOFTENER) 100 MG capsule TAKE 1 CAPSULE BY MOUTH EVERY DAY  30 capsule  5  . glucosamine-chondroitin 500-400 MG tablet Take 1 tablet by mouth 2 (two) times daily.       Marland Kitchen levothyroxine (SYNTHROID) 75 MCG tablet TAKE 1 TABLET (75 MCG TOTAL) BY MOUTH DAILY.  30 tablet  11  . LORazepam (ATIVAN) 1 MG tablet Take 1 mg by mouth 2 (two) times daily as needed.        . Methylcellulose, Laxative, (CITRUCEL) 500 MG TABS Take by mouth daily.      . metoprolol succinate (TOPROL-XL) 50 MG 24 hr tablet TAKE 1 TABLET EVERY DAY  30 tablet  12  . montelukast (SINGULAIR) 10 MG tablet TAKE 1 TABLET BY MOUTH EVERY  DAY  30 tablet  12  . Multiple Vitamins-Minerals (ONE-A-DAY WOMENS 50+ ADVANTAGE) TABS Take 1 tablet by mouth daily.      . naproxen sodium (ALEVE) 220 MG tablet Take 220 mg by mouth daily.       . Omega-3 Fatty Acids (FISH OIL) 1200 MG CAPS Take 1 capsule by mouth 3 (three) times daily.      Marland Kitchen oxybutynin (OXYTROL) 3.9 MG/24HR Place 1 patch onto the skin 2 (two) times a week.  8 patch  12  . pantoprazole (PROTONIX) 40 MG tablet TAKE 1 TABLET EVERY DAY  30 tablet  11  . polyethylene glycol powder (GLYCOLAX/MIRALAX) powder TAKE 17GRAMS (1 CAPFUL) IN 8 OUNCES OF WATER ONCE DAILY  527 g  10  . pramoxine (SARNA SENSITIVE) 1 % LOTN Apply topically 2 (two) times daily.      . VESICARE 5 MG tablet TAKE ONE TABLET BY MOUTH ONCE DAILY  30 tablet  12  . vitamin B-12 (CYANOCOBALAMIN) 1000 MCG tablet Take 1,000 mcg by mouth daily.        No current facility-administered medications on file prior to visit.   Review of Systems Constitutional: Negative for diaphoresis, activity change, appetite change or unexpected weight change.  HENT: Negative for hearing loss, ear pain, facial swelling, mouth sores and neck stiffness.   Eyes: Negative for pain, redness and visual disturbance.  Respiratory: Negative for shortness of breath and wheezing.   Cardiovascular: Negative for chest pain and palpitations.  Gastrointestinal: Negative for diarrhea, blood in stool, abdominal distention or other pain Genitourinary: Negative for hematuria, flank pain or change in urine volume.  Musculoskeletal: Negative for myalgias and joint swelling.  Skin: Negative for color change and wound.  Neurological: Negative for syncope and numbness. other than noted Hematological: Negative for adenopathy.  Psychiatric/Behavioral: Negative for hallucinations, self-injury, decreased concentration and agitation.      Objective:   Physical Exam BP 140/76  Pulse 88  Temp(Src) 97.1 F (36.2 C) (Oral)  Ht 4\' 11"  (1.499 m)  Wt 146 lb (66.225 kg)  BMI 29.47 kg/m2  SpO2 98% VS noted,  Constitutional: Pt is oriented to person, place, and time. Appears well-developed and well-nourished.  Head: Normocephalic and atraumatic.  Right Ear: External ear normal.  Left Ear: External ear normal.  Nose: Nose normal.  Mouth/Throat: Oropharynx is clear and moist.  Eyes: Conjunctivae and EOM are normal. Pupils are equal, round, and reactive to light.  Neck: Normal range of motion. Neck supple. No JVD present. No tracheal deviation present.  Cardiovascular: Normal rate, regular rhythm, normal heart sounds and intact distal pulses.   Pulmonary/Chest: Effort normal and breath sounds normal.  Abdominal: Soft. Bowel sounds are normal. There is no tenderness. No HSM  Musculoskeletal: Normal range of motion. Exhibits no edema.  Lymphadenopathy:  Has no cervical  adenopathy.  Neurological: Pt is alert and oriented to person, place, and time. Pt has normal reflexes. No cranial nerve deficit.  Skin: Skin is warm and dry. No rash noted.  Psychiatric:  Has  normal mood and affect. Behavior is normal.     Assessment & Plan:

## 2013-08-01 ENCOUNTER — Encounter: Payer: Self-pay | Admitting: Internal Medicine

## 2013-08-01 ENCOUNTER — Other Ambulatory Visit: Payer: Self-pay | Admitting: Internal Medicine

## 2013-08-01 ENCOUNTER — Ambulatory Visit: Payer: Medicare Other

## 2013-08-01 DIAGNOSIS — D509 Iron deficiency anemia, unspecified: Secondary | ICD-10-CM

## 2013-08-01 DIAGNOSIS — D649 Anemia, unspecified: Secondary | ICD-10-CM

## 2013-08-01 LAB — IBC PANEL
Iron: 31 ug/dL — ABNORMAL LOW (ref 42–145)
Saturation Ratios: 7.1 % — ABNORMAL LOW (ref 20.0–50.0)

## 2013-08-28 ENCOUNTER — Other Ambulatory Visit: Payer: Self-pay | Admitting: Internal Medicine

## 2013-08-30 ENCOUNTER — Other Ambulatory Visit: Payer: Self-pay | Admitting: Internal Medicine

## 2013-09-03 ENCOUNTER — Ambulatory Visit (INDEPENDENT_AMBULATORY_CARE_PROVIDER_SITE_OTHER): Payer: 59 | Admitting: Gastroenterology

## 2013-09-03 ENCOUNTER — Other Ambulatory Visit: Payer: Medicare Other

## 2013-09-03 ENCOUNTER — Encounter: Payer: Self-pay | Admitting: Gastroenterology

## 2013-09-03 VITALS — BP 128/70 | HR 68 | Ht 59.0 in | Wt 147.2 lb

## 2013-09-03 DIAGNOSIS — K5900 Constipation, unspecified: Secondary | ICD-10-CM

## 2013-09-03 DIAGNOSIS — D509 Iron deficiency anemia, unspecified: Secondary | ICD-10-CM

## 2013-09-03 DIAGNOSIS — K219 Gastro-esophageal reflux disease without esophagitis: Secondary | ICD-10-CM

## 2013-09-03 NOTE — Patient Instructions (Signed)
Your physician has requested that you go to the basement for the following lab work before leaving today: Celiac panel.  Follow instructions on Hemoccult cards and mail them back to [place] when finished.   It has been recommended to you by your physician that you have a(n) Endoscopy completed. Per your request, we did not schedule the procedure(s) today. Please contact our office at [phone] should you decide to have the procedure completed.  Thank you for choosing me and [facility].  Venita Lick. Pleas Koch., MD., [facility] Graham

## 2013-09-03 NOTE — Progress Notes (Signed)
    History of Present Illness: This is a 67 year old female with iron deficiency anemia: hemoglobin of 11.1, iron saturation of 7.1%. She underwent colonoscopy in November 2012 for change in bowel habits and constipation. Colonoscopy was normal. Her GERD is well controlled on daily pantoprazole. Her constipation is well-controlled on a daily stool softener, daily MiraLax and daily Citrucel. She states she occasionally notes gum bleeding with brushing. She's been chewing ice for the past several months. Denies weight loss, abdominal pain, constipation, diarrhea, change in stool caliber, melena, hematochezia, nausea, vomiting, dysphagia, reflux symptoms, chest pain.  Current Medications, Allergies, Past Medical History, Past Surgical History, Family History and Social History were reviewed in [employer] record.  Physical Exam: General: Well developed , well nourished, no acute distress Head: Normocephalic and atraumatic Eyes:  sclerae anicteric, EOMI Ears: Normal auditory acuity Mouth: No deformity or lesions Lungs: Clear throughout to auscultation Heart: Regular rate and rhythm; no murmurs, rubs or bruits Abdomen: Soft, non tender and non distended. No masses, hepatosplenomegaly or hernias noted. Normal Bowel sounds Musculoskeletal: Symmetrical with no gross deformities  Pulses:  Normal pulses noted Extremities: No clubbing, cyanosis, edema or deformities noted Neurological: Alert oriented x 4, grossly nonfocal Psychological:  Alert and cooperative. Normal mood and affect  Assessment and Recommendations:  1. Iron deficiency anemia. Colonoscopy 2 years ago normal. Obtain stool Hemoccults. Obtain a celiac panel. Continue iron supplements. Followup CBC with Dr. Jonny Ruiz. Schedule upper endoscopy. The risks, benefits, and alternatives to endoscopy with possible biopsy and possible dilation were discussed with the patient and they consent to proceed. She states it would be  difficult for her to find someone to drive her home from the procedure symptoms she declined to schedule an EGD and states she would call back.  2. GERD. Continue standard antireflux measures and pantoprazole 40 mg daily.  3. Constipation. Continue daily stool softener, daily MiraLax daily Citrucel and adequate daily water intake.

## 2013-09-04 ENCOUNTER — Ambulatory Visit (AMBULATORY_SURGERY_CENTER): Payer: Medicare Other

## 2013-09-04 VITALS — Ht 59.0 in | Wt 143.8 lb

## 2013-09-04 DIAGNOSIS — D509 Iron deficiency anemia, unspecified: Secondary | ICD-10-CM

## 2013-09-05 LAB — CELIAC PANEL 10
IgA: 172 mg/dL (ref 69–380)
Tissue Transglut Ab: 7.2 U/mL (ref <20)
Tissue Transglutaminase Ab, IgA: 4.4 U/mL (ref <20)

## 2013-09-06 ENCOUNTER — Telehealth: Payer: Self-pay | Admitting: Gastroenterology

## 2013-09-06 ENCOUNTER — Encounter: Payer: Self-pay | Admitting: Gastroenterology

## 2013-09-06 ENCOUNTER — Ambulatory Visit (AMBULATORY_SURGERY_CENTER): Payer: 59 | Admitting: Gastroenterology

## 2013-09-06 VITALS — BP 120/58 | HR 74 | Temp 98.4°F | Resp 21 | Ht 59.0 in | Wt 143.0 lb

## 2013-09-06 DIAGNOSIS — D509 Iron deficiency anemia, unspecified: Secondary | ICD-10-CM

## 2013-09-06 DIAGNOSIS — K219 Gastro-esophageal reflux disease without esophagitis: Secondary | ICD-10-CM

## 2013-09-06 DIAGNOSIS — D649 Anemia, unspecified: Secondary | ICD-10-CM

## 2013-09-06 MED ORDER — SODIUM CHLORIDE 0.9 % IV SOLN: 500.0000 mL | INTRAVENOUS | Status: DC

## 2013-09-06 NOTE — Progress Notes (Signed)
Patient did not experience any of the following events: a burn prior to discharge; a fall within the facility; wrong site/side/patient/procedure/implant event; or a hospital transfer or hospital admission upon discharge from the facility. ([id number])Patient did not have preoperative order for IV antibiotic SSI prophylaxis. ([id number]) ewm 

## 2013-09-06 NOTE — Progress Notes (Signed)
Report to pacu rn, vss, bbs=clear 

## 2013-09-06 NOTE — Patient Instructions (Addendum)

## 2013-09-06 NOTE — Telephone Encounter (Signed)
pt called back and said disreguard the msg.  She knows the answer and there is no need to return the call

## 2013-09-06 NOTE — Progress Notes (Signed)
No egg or soy allergy. ewm No problems with past sedation. ewm 

## 2013-09-06 NOTE — Op Note (Signed)
[facility] [address] [place], [address]   ENDOSCOPY PROCEDURE REPORT  PATIENT: Sue Green, Sue Green  MR#: [id number] BIRTHDATE: 1946/08/30 , 67  yrs. old GENDER: Female ENDOSCOPIST: Meryl Dare, MD, [facility] PROCEDURE DATE:  09/06/2013 PROCEDURE:  EGD, diagnostic ASA CLASS:     Class II INDICATIONS:  Iron deficiency anemia.  GERD. MEDICATIONS: MAC sedation, administered by CRNA and propofol (Diprivan) 150mg  IV TOPICAL ANESTHETIC: Cetacaine Spray DESCRIPTION OF PROCEDURE: After the risks benefits and alternatives of the procedure were thoroughly explained, informed consent was obtained.  The LB [id number] endoscope was introduced through the mouth and advanced to the pylorus. 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  The instrument was slowly withdrawn as the mucosa was fully examined.  ESOPHAGUS: The mucosa of the esophagus appeared normal. STOMACH: Non-traversable stenosis was found at the pylorus. Multiple attempts to advance through the pylorus were unsuccessful. Retained gastric solids obscured parts of the body and fundus. The stomach otherwise appeared normal.  Keyhole view of duodenal bulb was normal. Retroflexed views revealed no abnormalities. The scope was then withdrawn from the patient and the procedure completed.  COMPLICATIONS: There were no complications.  ENDOSCOPIC IMPRESSION: 1.   Retained gastric solids 2.   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  Anti-reflux regimen 2.  Continue PPI daily 3.  My office will arrange for you to have a UGI SBFT.  This is a radiology test to examine your UGI tract. Clear liquid diet for 24 hours prior to UGI SBFT  eSigned:  Meryl Dare, MD, [facility] 09/06/2013 11:55 AM

## 2013-09-07 ENCOUNTER — Other Ambulatory Visit: Payer: Self-pay

## 2013-09-07 ENCOUNTER — Encounter: Payer: Self-pay | Admitting: Gastroenterology

## 2013-09-07 ENCOUNTER — Telehealth: Payer: Self-pay | Admitting: *Deleted

## 2013-09-07 DIAGNOSIS — D509 Iron deficiency anemia, unspecified: Secondary | ICD-10-CM

## 2013-09-07 NOTE — Progress Notes (Signed)
Patient advised of UGI SBFT @ [facility] 09/17/13 10;30.  She is advised to be NPO after midnight

## 2013-09-07 NOTE — Telephone Encounter (Signed)
  Follow up Call-  Call back number 09/06/2013 08/18/2011  Post procedure Call Back phone  # [phone]  Permission to leave phone message Yes -     Patient questions:  Do you have a fever, pain , or abdominal swelling? no Pain Score  0 *  Have you tolerated food without any problems? yes  Have you been able to return to your normal activities? yes  Do you have any questions about your discharge instructions: Diet   no Medications  no Follow up visit  no  Do you have questions or concerns about your Care? no  Actions: * If pain score is 4 or above: No action needed, pain <4. Patient stating she felt "sluggish" yesterday and last evening."sort of like I had the flu, but I've had my flu shot." denies abdominal pain, nausea,vomiting or fever. No bleeding reported. Patient was able to eat and drink a multitude of soft  foods yesterday without difficulty. Patient stating she "can tell a change" today, feeling better. Instructed to call with any problems,fever, abdominal pain, bleeding or any concerns.

## 2013-09-08 ENCOUNTER — Telehealth: Payer: Self-pay | Admitting: Internal Medicine

## 2013-09-08 NOTE — Telephone Encounter (Signed)
Pt called to ask questions about her UGI series ordered for Monday (call at 00:10, 09/08/2013) Wanted examples of liquids and what she could have beginning 24 hrs from her scheduled radiology exam I instructed her to follow a liquid only diet for 24 hours leading up to UGI (I advised her to stay away from any food that must be chewed to swallow). She voiced understanding She reports feeling better than yesterday, has mild soreness under breasts, but no n/v.  Appetite is good, tolerating regular diet.  No fevers, chills.  Energy level is back to normal She will call with other questions or concerns

## 2013-09-14 ENCOUNTER — Other Ambulatory Visit (INDEPENDENT_AMBULATORY_CARE_PROVIDER_SITE_OTHER): Payer: 59

## 2013-09-14 DIAGNOSIS — K5900 Constipation, unspecified: Secondary | ICD-10-CM

## 2013-09-14 DIAGNOSIS — K219 Gastro-esophageal reflux disease without esophagitis: Secondary | ICD-10-CM

## 2013-09-14 DIAGNOSIS — D509 Iron deficiency anemia, unspecified: Secondary | ICD-10-CM

## 2013-09-14 LAB — HEMOCCULT SLIDES (X 3 CARDS)
Fecal Occult Blood: NEGATIVE
OCCULT 2: NEGATIVE

## 2013-09-17 ENCOUNTER — Ambulatory Visit (HOSPITAL_COMMUNITY)
Admission: RE | Admit: 2013-09-17 | Discharge: 2013-09-17 | Disposition: A | Discharge status: Home / Self Care | Payer: 59 | Source: Ambulatory Visit | Attending: Gastroenterology | Admitting: Gastroenterology

## 2013-09-17 DIAGNOSIS — K5900 Constipation, unspecified: Secondary | ICD-10-CM | POA: Insufficient documentation

## 2013-09-17 DIAGNOSIS — M4120 Other idiopathic scoliosis, site unspecified: Secondary | ICD-10-CM | POA: Insufficient documentation

## 2013-09-17 DIAGNOSIS — D509 Iron deficiency anemia, unspecified: Secondary | ICD-10-CM

## 2013-09-17 DIAGNOSIS — M47817 Spondylosis without myelopathy or radiculopathy, lumbosacral region: Secondary | ICD-10-CM | POA: Insufficient documentation

## 2013-09-17 DIAGNOSIS — K311 Adult hypertrophic pyloric stenosis: Secondary | ICD-10-CM | POA: Insufficient documentation

## 2013-09-28 ENCOUNTER — Other Ambulatory Visit: Payer: Self-pay | Admitting: Internal Medicine

## 2013-09-28 NOTE — Telephone Encounter (Signed)
Okay for refill?  

## 2013-10-16 ENCOUNTER — Other Ambulatory Visit: Payer: Self-pay | Admitting: Internal Medicine

## 2013-10-17 ENCOUNTER — Other Ambulatory Visit: Payer: Self-pay | Admitting: Internal Medicine

## 2013-11-09 ENCOUNTER — Other Ambulatory Visit: Payer: Self-pay | Admitting: Internal Medicine

## 2013-11-12 NOTE — Telephone Encounter (Signed)
Last filled 10/2012 with 11 refills and last office visit with you 11/14 with labs--please advise

## 2013-11-13 NOTE — Telephone Encounter (Signed)
Ok to forward to [place] for help

## 2013-11-20 ENCOUNTER — Other Ambulatory Visit: Payer: Self-pay | Admitting: Internal Medicine

## 2013-11-27 ENCOUNTER — Other Ambulatory Visit: Payer: Self-pay | Admitting: Internal Medicine

## 2013-12-17 ENCOUNTER — Other Ambulatory Visit: Payer: Self-pay | Admitting: Internal Medicine

## 2013-12-27 ENCOUNTER — Other Ambulatory Visit: Payer: Self-pay | Admitting: Internal Medicine

## 2014-01-20 ENCOUNTER — Other Ambulatory Visit: Payer: Self-pay | Admitting: Internal Medicine

## 2014-02-03 ENCOUNTER — Other Ambulatory Visit: Payer: Self-pay | Admitting: Internal Medicine

## 2014-02-07 ENCOUNTER — Other Ambulatory Visit: Payer: Self-pay | Admitting: Internal Medicine

## 2014-02-07 ENCOUNTER — Other Ambulatory Visit: Payer: Self-pay

## 2014-02-07 MED ORDER — METOPROLOL SUCCINATE ER 50 MG PO TB24: 50.0000 mg | ORAL_TABLET | Freq: Every day | ORAL | Status: DC

## 2014-02-16 ENCOUNTER — Other Ambulatory Visit: Payer: Self-pay | Admitting: Internal Medicine

## 2014-02-18 ENCOUNTER — Other Ambulatory Visit: Payer: Self-pay | Admitting: Internal Medicine

## 2014-02-18 NOTE — Telephone Encounter (Signed)
Pt called to check up on this med, she is out and need this medication in the morning. Offer an appt for tomorrow but pt can not come in. Please advise.

## 2014-02-19 MED ORDER — BENZONATATE 100 MG PO CAPS: ORAL_CAPSULE | ORAL | Status: DC

## 2014-02-19 NOTE — Telephone Encounter (Signed)
Tessalon 100 mg capsule

## 2014-02-19 NOTE — Telephone Encounter (Signed)
Please clarify name of med

## 2014-02-19 NOTE — Addendum Note (Signed)
Addended by: Sharon Seller B on: 02/19/2014 03:41 PM   Modules accepted: Orders

## 2014-02-20 ENCOUNTER — Ambulatory Visit: Payer: 59 | Admitting: Internal Medicine

## 2014-03-06 ENCOUNTER — Ambulatory Visit (INDEPENDENT_AMBULATORY_CARE_PROVIDER_SITE_OTHER): Payer: 59 | Admitting: Internal Medicine

## 2014-03-06 ENCOUNTER — Encounter: Payer: Self-pay | Admitting: Internal Medicine

## 2014-03-06 ENCOUNTER — Other Ambulatory Visit (INDEPENDENT_AMBULATORY_CARE_PROVIDER_SITE_OTHER): Payer: 59

## 2014-03-06 VITALS — BP 140/70 | HR 84 | Temp 98.1°F | Wt 135.0 lb

## 2014-03-06 DIAGNOSIS — F341 Dysthymic disorder: Secondary | ICD-10-CM

## 2014-03-06 DIAGNOSIS — F329 Major depressive disorder, single episode, unspecified: Secondary | ICD-10-CM

## 2014-03-06 DIAGNOSIS — R053 Chronic cough: Secondary | ICD-10-CM

## 2014-03-06 DIAGNOSIS — D509 Iron deficiency anemia, unspecified: Secondary | ICD-10-CM

## 2014-03-06 DIAGNOSIS — R05 Cough: Secondary | ICD-10-CM

## 2014-03-06 DIAGNOSIS — F32A Depression, unspecified: Secondary | ICD-10-CM

## 2014-03-06 DIAGNOSIS — F419 Anxiety disorder, unspecified: Secondary | ICD-10-CM

## 2014-03-06 DIAGNOSIS — R059 Cough, unspecified: Secondary | ICD-10-CM

## 2014-03-06 DIAGNOSIS — I10 Essential (primary) hypertension: Secondary | ICD-10-CM

## 2014-03-06 DIAGNOSIS — Z0000 Encounter for general adult medical examination without abnormal findings: Secondary | ICD-10-CM

## 2014-03-06 HISTORY — DX: Iron deficiency anemia, unspecified: D50.9

## 2014-03-06 LAB — IBC PANEL
Iron: 91 ug/dL (ref 42–145)
Saturation Ratios: 29 % (ref 20.0–50.0)
Transferrin: 224.5 mg/dL (ref 212.0–360.0)

## 2014-03-06 LAB — CBC WITH DIFFERENTIAL/PLATELET
Basophils Absolute: 0 10*3/uL (ref 0.0–0.1)
Basophils Relative: 0.2 % (ref 0.0–3.0)
Eosinophils Absolute: 0.4 10*3/uL (ref 0.0–0.7)
Eosinophils Relative: 5.4 % — ABNORMAL HIGH (ref 0.0–5.0)
HCT: 34.9 % — ABNORMAL LOW (ref 36.0–46.0)
HEMOGLOBIN: 12.2 g/dL (ref 12.0–15.0)
Lymphocytes Relative: 22.9 % (ref 12.0–46.0)
Lymphs Abs: 1.7 10*3/uL (ref 0.7–4.0)
MCHC: 34.8 g/dL (ref 30.0–36.0)
MCV: 99.7 fl (ref 78.0–100.0)
MONOS PCT: 8.6 % (ref 3.0–12.0)
Monocytes Absolute: 0.7 10*3/uL (ref 0.1–1.0)
NEUTROS ABS: 4.8 10*3/uL (ref 1.4–7.7)
Neutrophils Relative %: 62.9 % (ref 43.0–77.0)
Platelets: 350 10*3/uL (ref 150.0–400.0)
RBC: 3.5 Mil/uL — ABNORMAL LOW (ref 3.87–5.11)
RDW: 13.4 % (ref 11.5–15.5)
WBC: 7.6 10*3/uL (ref 4.0–10.5)

## 2014-03-06 MED ORDER — BENZONATATE 100 MG PO CAPS: ORAL_CAPSULE | ORAL | Status: DC

## 2014-03-06 MED ORDER — CYCLOBENZAPRINE HCL 10 MG PO TABS: ORAL_TABLET | ORAL | Status: DC

## 2014-03-06 NOTE — Patient Instructions (Addendum)
Please continue all other medications as before, and refills have been done if requested. - the tessalon, and the flexeril  Please have the pharmacy call with any other refills you may need.  Please continue your efforts at being more active, low cholesterol diet, and weight control.  Please keep your appointments with your specialists as you may have planned  Please go to the LAB in the Basement (turn left off the elevator) for the tests to be done today  You will be contacted by phone if any changes need to be made immediately.  Otherwise, you will receive a letter about your results with an explanation, but please check with MyChart first.  Please remember to sign up for MyChart if you have not done so, as this will be important to you in the future with finding out test results, communicating by private email, and scheduling acute appointments online when needed.  Please return in 6 months, or sooner if needed

## 2014-03-06 NOTE — Progress Notes (Signed)
Subjective:    Patient ID: Sue Green, female    DOB: 06/22/1946, 68 y.o.   MRN: [id number]  HPI  Here to f/u; overall doing ok,  Pt denies chest pain, increased sob or doe, wheezing, orthopnea, PND, increased LE swelling, palpitations, dizziness or syncope.  Pt denies polydipsia, polyuria, or low sugar symptoms such as weakness or confusion improved with po intake.  Pt denies new neurological symptoms such as new headache, or facial or extremity weakness or numbness.   Pt states overall good compliance with meds, has been trying to follow lower cholesterol diet.  Has ongoing chronic cough, unclear etiology, better with tess perle. No overt recent bleeding.  Denies worsening depressive symptoms, suicidal ideation, or panic; has ongoing anxiety, stable recently. Past Medical History  Diagnosis Date  . Hypertension   . Anxiety   . Allergy   . GERD (gastroesophageal reflux disease)   . Asthma   . Thyroid disease     hypothyroidism  . Fibromyalgia   . Osteopenia   . Chronic tension headaches     IN PAST  . Internal hemorrhoids   . Depression   . Glaucoma      Per pt, she does not have glaucoma.  . Arthritis   . Iron deficiency anemia   . Scoliosis   . Anemia, iron deficiency 03/06/2014   Past Surgical History  Procedure Laterality Date  . Tonsillectomy and adenoidectomy      68 years old  . Shoulder arthroscopy  2001    rt shoulder  . Appendectomy  1975    reports that she has never smoked. She has never used smokeless tobacco. She reports that she does not drink alcohol or use illicit drugs. family history includes Arthritis in her mother; Cancer in her father; Diabetes in her father and mother; Pancreatic cancer in her father. There is no history of Colon cancer. Allergies  Allergen Reactions  . Penicillins Hives  . Doxycycline Nausea Only  . Diphenhydramine   . Latex     BREAK OUT IN RASH   Current Outpatient Prescriptions on File Prior to Visit  Medication Sig  Dispense Refill  . Ascorbic Acid (VITAMIN C) 500 MG tablet Take 500 mg by mouth daily.        . benzonatate (TESSALON) 100 MG capsule TAKE 2 CAPSULES (200 MG TOTAL) BY MOUTH 3 (THREE) TIMES DAILY AS NEEDED FOR COUGH.  120 capsule  0  . Calcium-Vitamin D-Vitamin K (CALCIUM + D) [phone] MG-UNT-MCG CHEW Chew 1 tablet by mouth 2 (two) times daily.      . [employer] STOOL SOFTENER 100 MG capsule TAKE 1 CAPSULE BY MOUTH EVERY DAY  30 capsule  5  . desvenlafaxine (PRISTIQ) 100 MG 24 hr tablet Take 100 mg by mouth daily.        . ferrous sulfate 325 (65 FE) MG tablet Take 325 mg by mouth daily with breakfast.      . glucosamine-chondroitin 500-400 MG tablet Take 1 tablet by mouth 2 (two) times daily.       Marland Kitchen LORazepam (ATIVAN) 1 MG tablet Take 1 mg by mouth 2 (two) times daily as needed.        . Methylcellulose, Laxative, (CITRUCEL) 500 MG TABS Take by mouth daily.      . metoprolol succinate (TOPROL-XL) 50 MG 24 hr tablet Take 1 tablet (50 mg total) by mouth daily. Take with or immediately following a meal.  30 tablet  12  . montelukast (SINGULAIR)  10 MG tablet TAKE 1 TABLET BY MOUTH EVERY DAY  30 tablet  11  . Multiple Vitamins-Minerals (ONE-A-DAY WOMENS 50+ ADVANTAGE) TABS Take 1 tablet by mouth daily.      . naproxen sodium (ALEVE) 220 MG tablet Take 220 mg by mouth daily.       . pantoprazole (PROTONIX) 40 MG tablet TAKE 1 TABLET EVERY DAY  30 tablet  11  . polyethylene glycol powder (GLYCOLAX/MIRALAX) powder TAKE 17GRAMS (1 CAPFUL) IN 8 OUNCES OF WATER ONCE DAILY  527 g  10  . SYNTHROID 75 MCG tablet TAKE 1 TABLET (75 MCG TOTAL) BY MOUTH DAILY.  30 tablet  11  . VESICARE 5 MG tablet TAKE ONE TABLET BY MOUTH ONCE DAILY  30 tablet  5   No current facility-administered medications on file prior to visit.   Review of Systems  Constitutional: Negative for unusual diaphoresis or other sweats  HENT: Negative for ringing in ear Eyes: Negative for double vision or worsening visual disturbance.    Respiratory: Negative for choking and stridor.   Gastrointestinal: Negative for vomiting or other signifcant bowel change Genitourinary: Negative for hematuria or decreased urine volume.  Musculoskeletal: Negative for other MSK pain or swelling Skin: Negative for color change and worsening wound.  Neurological: Negative for tremors and numbness other than noted  Psychiatric/Behavioral: Negative for decreased concentration or agitation other than above       Objective:   Physical Exam BP 140/70  Pulse 84  Temp(Src) 98.1 F (36.7 C) (Oral)  Wt 135 lb (61.236 kg)  SpO2 98% VS noted,  Constitutional: Pt appears well-developed, well-nourished.  HENT: Head: NCAT.  Right Ear: External ear normal.  Left Ear: External ear normal.  Eyes: . Pupils are equal, round, and reactive to light. Conjunctivae and EOM are normal Neck: Normal range of motion. Neck supple.  Cardiovascular: Normal rate and regular rhythm.   Pulmonary/Chest: Effort normal and breath sounds normal.  Abd:  Soft, NT, ND, + BS Neurological: Pt is alert. Not confused , motor grossly intact Skin: Skin is warm. No rash Psychiatric: Pt behavior is normal. No agitation. mild nervous, not depressed affect    Assessment & Plan:

## 2014-03-06 NOTE — Progress Notes (Signed)
Pre visit review using our clinic review tool, if applicable. No additional management support is needed unless otherwise documented below in the visit note. 

## 2014-03-07 NOTE — Assessment & Plan Note (Signed)
stable overall by history and exam, recent data reviewed with pt, and pt to continue medical treatment as before,  to f/u any worsening symptoms or concerns BP Readings from Last 3 Encounters:  03/06/14 140/70  09/06/13 120/58  09/03/13 128/70

## 2014-03-07 NOTE — Assessment & Plan Note (Signed)
stable overall by history and exam, recent data reviewed with pt, and pt to continue medical treatment as before,  to f/u any worsening symptoms or concerns Lab Results  Component Value Date   WBC 7.6 03/06/2014   HGB 12.2 03/06/2014   HCT 34.9* 03/06/2014   MCV 99.7 03/06/2014   PLT 350.0 03/06/2014   For f/u labs including iron

## 2014-03-07 NOTE — Assessment & Plan Note (Signed)
Unclear etiology, though I wonder about chronic allergies and post nasal gtt, but declines other tx today, simply asks for tess perle prn, is not narcotic drug seeking

## 2014-03-07 NOTE — Assessment & Plan Note (Signed)
Mild persistent, declines any change in tx today or referral for counseling

## 2014-04-29 ENCOUNTER — Ambulatory Visit (INDEPENDENT_AMBULATORY_CARE_PROVIDER_SITE_OTHER): Payer: PRIVATE HEALTH INSURANCE

## 2014-04-29 ENCOUNTER — Encounter: Payer: Self-pay | Admitting: Podiatry

## 2014-04-29 ENCOUNTER — Ambulatory Visit (INDEPENDENT_AMBULATORY_CARE_PROVIDER_SITE_OTHER): Payer: PRIVATE HEALTH INSURANCE | Admitting: Podiatry

## 2014-04-29 VITALS — BP 134/64 | HR 82 | Resp 16

## 2014-04-29 DIAGNOSIS — S92919A Unspecified fracture of unspecified toe(s), initial encounter for closed fracture: Secondary | ICD-10-CM

## 2014-04-29 DIAGNOSIS — M79609 Pain in unspecified limb: Secondary | ICD-10-CM

## 2014-04-29 DIAGNOSIS — M79674 Pain in right toe(s): Secondary | ICD-10-CM

## 2014-04-29 DIAGNOSIS — S8290XD Unspecified fracture of unspecified lower leg, subsequent encounter for closed fracture with routine healing: Secondary | ICD-10-CM

## 2014-04-29 DIAGNOSIS — S92911D Unspecified fracture of right toe(s), subsequent encounter for fracture with routine healing: Secondary | ICD-10-CM

## 2014-04-29 DIAGNOSIS — S92911A Unspecified fracture of right toe(s), initial encounter for closed fracture: Secondary | ICD-10-CM

## 2014-04-29 NOTE — Progress Notes (Signed)
   Subjective:    Patient ID: Sue Green, female    DOB: March 03, 1946, 68 y.o.   MRN: [id number]  HPI Comments: "I hit my toe"  Patient c/o aching 5th toe right since Saturday night (2 days). She hit her toe suddenly. The area is swollen and bruised. Shoes are uncomfortable. No home treatment.     Review of Systems  Gastrointestinal: Positive for constipation and abdominal distention.  Endocrine: Positive for polyphagia.  Musculoskeletal: Positive for arthralgias and back pain.  All other systems reviewed and are negative.      Objective:   Physical Exam        Assessment & Plan:

## 2014-04-29 NOTE — Progress Notes (Signed)
Subjective:     Patient ID: Sue Green, female   DOB: 05/20/1946, 68 y.o.   MRN: [id number]  HPI patient states she traumatized her right fifth toe yesterday and it's making it hard for her to wear shoe gear comfortably   Review of Systems  All other systems reviewed and are negative.      Objective:   Physical Exam  Nursing note and vitals reviewed. Constitutional: She is oriented to person, place, and time.  Cardiovascular: Intact distal pulses.   Musculoskeletal: Normal range of motion.  Neurological: She is oriented to person, place, and time.  Skin: Skin is warm and dry.   Neurovascular status intact with muscle strength adequate and range of motion within normal limits. Mild equinus condition is noted and I noted there also to be well perfused digits. There is edema and ecchymosis around the fifth digit right with pain localized mostly to the shaft of the fifth digit proximal phalanx    Assessment:     Probable fracture of the fifth digit right foot    Plan:     X-rays revealed fracture and today I recommended immobilization which was accomplished with padding material and I advised immobilization as best as possible for the next few weeks. Reappoint her recheck as needed in 4 weeks

## 2014-04-30 ENCOUNTER — Telehealth: Payer: Self-pay | Admitting: *Deleted

## 2014-04-30 NOTE — Telephone Encounter (Signed)
I returned her call.  I told her she can take it off an re-apply some looser.  She stated it seems to be okay right now.  She stated she went out earlier and it was fine.  She said she takes Advil for her back every morning and that may have helped it.  She asked if she could apply ice.  I told her yes.  She stated that it was swollen a little.  I told her to elevate it as much as possible to help reduce the swelling.  She said she would leave it as is and play it by ear.  If it starts to bother me I will take the dressing off and put another one on.  Thanks for calling me.

## 2014-04-30 NOTE — Telephone Encounter (Signed)
In there yesterday.  He bandaged 2 of my toes together.  The bandage is so tight, it hurts awful.  I couldn't sleep last night.  My foot wasn't hurting at all before I came there.  Can I take this bandage off?  Call me before 2:30pm.

## 2014-05-01 DIAGNOSIS — S92919A Unspecified fracture of unspecified toe(s), initial encounter for closed fracture: Secondary | ICD-10-CM

## 2014-05-02 ENCOUNTER — Other Ambulatory Visit: Payer: Self-pay | Admitting: Internal Medicine

## 2014-05-02 DIAGNOSIS — S92919A Unspecified fracture of unspecified toe(s), initial encounter for closed fracture: Secondary | ICD-10-CM

## 2014-05-13 DIAGNOSIS — IMO0001 Reserved for inherently not codable concepts without codable children: Secondary | ICD-10-CM

## 2014-05-16 ENCOUNTER — Ambulatory Visit (INDEPENDENT_AMBULATORY_CARE_PROVIDER_SITE_OTHER): Payer: PRIVATE HEALTH INSURANCE

## 2014-05-16 ENCOUNTER — Ambulatory Visit (INDEPENDENT_AMBULATORY_CARE_PROVIDER_SITE_OTHER): Payer: PRIVATE HEALTH INSURANCE | Admitting: Podiatry

## 2014-05-16 VITALS — BP 113/68 | HR 81 | Resp 16

## 2014-05-16 DIAGNOSIS — S92919A Unspecified fracture of unspecified toe(s), initial encounter for closed fracture: Secondary | ICD-10-CM

## 2014-05-16 DIAGNOSIS — S92911A Unspecified fracture of right toe(s), initial encounter for closed fracture: Secondary | ICD-10-CM

## 2014-05-16 NOTE — Progress Notes (Signed)
Subjective:     Patient ID: Sue Green, female   DOB: Dec 22, 1945, 68 y.o.   MRN: [id number]  HPI patient presents stating she wanted to have another x-ray reviewed toe and it seems to be feeling some better and she does not want to have to wear a bandage on   Review of Systems     Objective:   Physical Exam Neurovascular status unchanged with mild edema fifth toe right that upon pressing still is slightly sore but is improved from previous visit    Assessment:     Improving from fracture fifth digit right foot    Plan:     Reviewed condition and at this time advised that she can go without having to use the taping on her toe do to good healing on x-ray

## 2014-06-25 ENCOUNTER — Other Ambulatory Visit: Payer: Self-pay

## 2014-06-25 MED ORDER — DOCUSATE SODIUM 100 MG PO CAPS: 100.0000 mg | ORAL_CAPSULE | Freq: Every day | ORAL | Status: DC

## 2014-07-01 ENCOUNTER — Other Ambulatory Visit: Payer: Self-pay | Admitting: *Deleted

## 2014-07-01 MED ORDER — BENZONATATE 100 MG PO CAPS: ORAL_CAPSULE | ORAL | Status: DC

## 2014-07-03 ENCOUNTER — Ambulatory Visit (INDEPENDENT_AMBULATORY_CARE_PROVIDER_SITE_OTHER): Payer: PRIVATE HEALTH INSURANCE | Admitting: Radiology

## 2014-07-03 DIAGNOSIS — Z23 Encounter for immunization: Secondary | ICD-10-CM

## 2014-07-22 LAB — HM MAMMOGRAPHY

## 2014-07-25 ENCOUNTER — Other Ambulatory Visit: Payer: Self-pay | Admitting: Internal Medicine

## 2014-08-12 ENCOUNTER — Other Ambulatory Visit: Payer: Self-pay | Admitting: Internal Medicine

## 2014-08-20 ENCOUNTER — Other Ambulatory Visit (INDEPENDENT_AMBULATORY_CARE_PROVIDER_SITE_OTHER): Payer: Medicare Other

## 2014-08-20 ENCOUNTER — Telehealth: Payer: Self-pay | Admitting: Internal Medicine

## 2014-08-20 DIAGNOSIS — I10 Essential (primary) hypertension: Secondary | ICD-10-CM

## 2014-08-20 DIAGNOSIS — R790 Abnormal level of blood mineral: Secondary | ICD-10-CM

## 2014-08-20 DIAGNOSIS — E039 Hypothyroidism, unspecified: Secondary | ICD-10-CM

## 2014-08-20 DIAGNOSIS — R3915 Urgency of urination: Secondary | ICD-10-CM

## 2014-08-20 DIAGNOSIS — Z0000 Encounter for general adult medical examination without abnormal findings: Secondary | ICD-10-CM

## 2014-08-20 LAB — CBC WITH DIFFERENTIAL/PLATELET
BASOS ABS: 0 10*3/uL (ref 0.0–0.1)
BASOS PCT: 0.3 % (ref 0.0–3.0)
Eosinophils Absolute: 0.4 10*3/uL (ref 0.0–0.7)
Eosinophils Relative: 4.1 % (ref 0.0–5.0)
HEMATOCRIT: 41.8 % (ref 36.0–46.0)
HEMOGLOBIN: 14.1 g/dL (ref 12.0–15.0)
LYMPHS ABS: 1.9 10*3/uL (ref 0.7–4.0)
LYMPHS PCT: 20.1 % (ref 12.0–46.0)
MCHC: 33.8 g/dL (ref 30.0–36.0)
MCV: 98 fl (ref 78.0–100.0)
MONOS PCT: 8.7 % (ref 3.0–12.0)
Monocytes Absolute: 0.8 10*3/uL (ref 0.1–1.0)
NEUTROS ABS: 6.2 10*3/uL (ref 1.4–7.7)
Neutrophils Relative %: 66.8 % (ref 43.0–77.0)
Platelets: 389 10*3/uL (ref 150.0–400.0)
RBC: 4.27 Mil/uL (ref 3.87–5.11)
RDW: 12.8 % (ref 11.5–15.5)
WBC: 9.3 10*3/uL (ref 4.0–10.5)

## 2014-08-20 LAB — BASIC METABOLIC PANEL
BUN: 10 mg/dL (ref 6–23)
CO2: 24 mEq/L (ref 19–32)
Calcium: 9.3 mg/dL (ref 8.4–10.5)
Chloride: 101 mEq/L (ref 96–112)
Creatinine, Ser: 0.7 mg/dL (ref 0.4–1.2)
GFR: 92.81 mL/min (ref >60.00)
GLUCOSE: 94 mg/dL (ref 70–99)
Potassium: 4.6 mEq/L (ref 3.5–5.1)
SODIUM: 135 meq/L (ref 135–145)

## 2014-08-20 LAB — LIPID PANEL
Cholesterol: 192 mg/dL (ref 0–200)
HDL: 38.2 mg/dL — ABNORMAL LOW (ref >39.00)
NONHDL: 153.8
TRIGLYCERIDES: 233 mg/dL — AB (ref 0.0–149.0)
Total CHOL/HDL Ratio: 5
VLDL: 46.6 mg/dL — AB (ref 0.0–40.0)

## 2014-08-20 LAB — URINALYSIS, ROUTINE W REFLEX MICROSCOPIC
Bilirubin Urine: NEGATIVE
Hgb urine dipstick: NEGATIVE
KETONES UR: NEGATIVE
Leukocytes, UA: NEGATIVE
Nitrite: NEGATIVE
PH: 7.5 (ref 5.0–8.0)
RBC / HPF: NONE SEEN (ref 0–?)
SPECIFIC GRAVITY, URINE: 1.01 (ref 1.000–1.030)
TOTAL PROTEIN, URINE-UPE24: NEGATIVE
UROBILINOGEN UA: 0.2 (ref 0.0–1.0)
Urine Glucose: NEGATIVE
WBC UA: NONE SEEN (ref 0–?)

## 2014-08-20 LAB — HEPATIC FUNCTION PANEL
ALBUMIN: 4.3 g/dL (ref 3.5–5.2)
ALK PHOS: 72 U/L (ref 39–117)
ALT: 23 U/L (ref 0–35)
AST: 22 U/L (ref 0–37)
BILIRUBIN DIRECT: 0 mg/dL (ref 0.0–0.3)
Total Bilirubin: 0.6 mg/dL (ref 0.2–1.2)
Total Protein: 6.8 g/dL (ref 6.0–8.3)

## 2014-08-20 LAB — TSH: TSH: 2.13 u[IU]/mL (ref 0.35–4.50)

## 2014-08-20 LAB — LDL CHOLESTEROL, DIRECT: LDL DIRECT: 126.9 mg/dL

## 2014-08-20 NOTE — Telephone Encounter (Signed)
Pt called in and wanted to go to labs today for cpe on 12/4

## 2014-08-23 ENCOUNTER — Ambulatory Visit (INDEPENDENT_AMBULATORY_CARE_PROVIDER_SITE_OTHER): Payer: Medicare Other | Admitting: Internal Medicine

## 2014-08-23 ENCOUNTER — Encounter: Payer: Self-pay | Admitting: Internal Medicine

## 2014-08-23 VITALS — BP 152/74 | HR 86 | Temp 97.8°F | Ht <= 58 in | Wt 141.8 lb

## 2014-08-23 DIAGNOSIS — R413 Other amnesia: Secondary | ICD-10-CM

## 2014-08-23 DIAGNOSIS — J019 Acute sinusitis, unspecified: Secondary | ICD-10-CM | POA: Insufficient documentation

## 2014-08-23 DIAGNOSIS — F418 Other specified anxiety disorders: Secondary | ICD-10-CM

## 2014-08-23 DIAGNOSIS — F329 Major depressive disorder, single episode, unspecified: Secondary | ICD-10-CM

## 2014-08-23 DIAGNOSIS — J018 Other acute sinusitis: Secondary | ICD-10-CM

## 2014-08-23 DIAGNOSIS — I10 Essential (primary) hypertension: Secondary | ICD-10-CM

## 2014-08-23 DIAGNOSIS — F419 Anxiety disorder, unspecified: Secondary | ICD-10-CM

## 2014-08-23 DIAGNOSIS — F32A Depression, unspecified: Secondary | ICD-10-CM

## 2014-08-23 DIAGNOSIS — Z0000 Encounter for general adult medical examination without abnormal findings: Secondary | ICD-10-CM

## 2014-08-23 MED ORDER — TIZANIDINE HCL 4 MG PO TABS: 4.0000 mg | ORAL_TABLET | Freq: Four times a day (QID) | ORAL | Status: DC | PRN

## 2014-08-23 MED ORDER — METOPROLOL SUCCINATE ER 50 MG PO TB24: ORAL_TABLET | ORAL | Status: DC

## 2014-08-23 MED ORDER — AZITHROMYCIN 250 MG PO TABS: ORAL_TABLET | ORAL | Status: DC

## 2014-08-23 NOTE — Progress Notes (Signed)
Subjective:    Patient ID: Sue Green, female    DOB: 1946/03/16, 68 y.o.   MRN: [id number]  HPI  Here for wellness and f/u;  Overall doing ok;  Pt denies CP, worsening SOB, DOE, wheezing, orthopnea, PND, worsening LE edema, palpitations, dizziness or syncope.  Pt denies neurological change such as new headache, facial or extremity weakness.  Pt denies polydipsia, polyuria, or low sugar symptoms. Pt states overall good compliance with treatment and medications, good tolerability, and has been trying to follow lower cholesterol diet.  Pt denies worsening depressive symptoms, suicidal ideation or panic. No fever, night sweats, wt loss, loss of appetite, or other constitutional symptoms.  Pt states good ability with ADL's, has low fall risk, home safety reviewed and adequate, no other significant changes in hearing or vision, and only occasionally active with exercise.  Needs flexeril change due to insurance.  Has some forgetfulness worsening recently as well, mother with dementia.  Here with 2-3 days acute onset fever, facial pain, pressure, headache, general weakness and malaise, and greenish d/c, with bloody small d/c. Past Medical History  Diagnosis Date  . Hypertension   . Anxiety   . Allergy   . GERD (gastroesophageal reflux disease)   . Asthma   . Thyroid disease     hypothyroidism  . Fibromyalgia   . Osteopenia   . Chronic tension headaches     IN PAST  . Internal hemorrhoids   . Depression   . Glaucoma      Per pt, she does not have glaucoma.  . Arthritis   . Iron deficiency anemia   . Scoliosis   . Anemia, iron deficiency 03/06/2014   Past Surgical History  Procedure Laterality Date  . Tonsillectomy and adenoidectomy      68 years old  . Shoulder arthroscopy  2001    rt shoulder  . Appendectomy  1975    reports that she has never smoked. She has never used smokeless tobacco. She reports that she does not drink alcohol or use illicit drugs. family history includes  Arthritis in her mother; Cancer in her father; Diabetes in her father and mother; Pancreatic cancer in her father. There is no history of Colon cancer. Allergies  Allergen Reactions  . Penicillins Hives  . Doxycycline Nausea Only  . Diphenhydramine   . Latex     BREAK OUT IN RASH   Current Outpatient Prescriptions on File Prior to Visit  Medication Sig Dispense Refill  . Ascorbic Acid (VITAMIN C) 500 MG tablet Take 500 mg by mouth daily.      . benzonatate (TESSALON) 100 MG capsule TAKE 2 CAPSULES BY MOUTH 3 TIMES A DAY AS NEEDED FOR COUGH 120 capsule 2  . Calcium-Vitamin D-Vitamin K (CALCIUM + D) [phone] MG-UNT-MCG CHEW Chew 1 tablet by mouth 2 (two) times daily.    . cyclobenzaprine (FLEXERIL) 10 MG tablet TAKE 1 TABLET BY MOUTH ONCE DAILY 30 tablet 5  . desvenlafaxine (PRISTIQ) 100 MG 24 hr tablet Take 100 mg by mouth daily.      Marland Kitchen docusate sodium ([employer]S STOOL SOFTENER) 100 MG capsule Take 1 capsule (100 mg total) by mouth daily. 30 capsule 5  . ferrous sulfate 325 (65 FE) MG tablet Take 325 mg by mouth daily with breakfast.    . glucosamine-chondroitin 500-400 MG tablet Take 1 tablet by mouth 2 (two) times daily.     Javier Docker Oil 300 MG CAPS Take by mouth daily.    Marland Kitchen  LORazepam (ATIVAN) 1 MG tablet Take 1 mg by mouth 2 (two) times daily as needed.      . Methylcellulose, Laxative, (CITRUCEL) 500 MG TABS Take by mouth daily.    . metoprolol succinate (TOPROL-XL) 50 MG 24 hr tablet Take 1 tablet (50 mg total) by mouth daily. Take with or immediately following a meal. 30 tablet 12  . montelukast (SINGULAIR) 10 MG tablet TAKE 1 TABLET BY MOUTH EVERY DAY 30 tablet 11  . Multiple Vitamins-Minerals (ONE-A-DAY WOMENS 50+ ADVANTAGE) TABS Take 1 tablet by mouth daily.    . naproxen sodium (ALEVE) 220 MG tablet Take 220 mg by mouth daily.     . pantoprazole (PROTONIX) 40 MG tablet TAKE 1 TABLET EVERY DAY 30 tablet 11  . polyethylene glycol powder (GLYCOLAX/MIRALAX) powder TAKE 17GRAMS (1 CAPFUL)  IN 8 OUNCES OF WATER ONCE DAILY 527 g 10  . SYNTHROID 75 MCG tablet TAKE 1 TABLET (75 MCG TOTAL) BY MOUTH DAILY. 30 tablet 11  . VESICARE 5 MG tablet TAKE 1 TABLET BY MOUTH EVERY DAY 30 tablet 5   No current facility-administered medications on file prior to visit.    Review of Systems Constitutional: Negative for increased diaphoresis, other activity, appetite or other siginficant weight change  HENT: Negative for worsening hearing loss, ear pain, facial swelling, mouth sores and neck stiffness.   Eyes: Negative for other worsening pain, redness or visual disturbance.  Respiratory: Negative for shortness of breath and wheezing.   Cardiovascular: Negative for chest pain and palpitations.  Gastrointestinal: Negative for diarrhea, blood in stool, abdominal distention or other pain Genitourinary: Negative for hematuria, flank pain or change in urine volume.  Musculoskeletal: Negative for myalgias or other joint complaints.  Skin: Negative for color change and wound.  Neurological: Negative for syncope and numbness. other than noted Hematological: Negative for adenopathy. or other swelling Psychiatric/Behavioral: Negative for hallucinations, self-injury, decreased concentration or other worsening agitation.      Objective:   Physical Exam BP 152/74 mmHg  Pulse 86  Temp(Src) 97.8 F (36.6 C) (Oral)  Ht 4' 8.69" (1.44 m)  Wt 141 lb 12 oz (64.297 kg)  BMI 31.01 kg/m2  SpO2 97% VS noted,  Constitutional: Pt is oriented to person, place, and time. Appears well-developed and well-nourished.  Head: Normocephalic and atraumatic.  Right Ear: External ear normal.  Left Ear: External ear normal.  Nose: Nose normal.  Mouth/Throat: Oropharynx is clear and moist.  Eyes: Conjunctivae and EOM are normal. Pupils are equal, round, and reactive to light.  Neck: Normal range of motion. Neck supple. No JVD present. No tracheal deviation present.  Cardiovascular: Normal rate, regular rhythm, normal  heart sounds and intact distal pulses.   Pulmonary/Chest: Effort normal and breath sounds without rales or wheezing  Abdominal: Soft. Bowel sounds are normal. NT. No HSM  Musculoskeletal: Normal range of motion. Exhibits no edema.  Lymphadenopathy:  Has no cervical adenopathy.  Neurological: Pt is alert and oriented to person, place, and time. Pt has normal reflexes. No cranial nerve deficit. Motor grossly intact Skin: Skin is warm and dry. No rash noted.  Psychiatric:  Has mild dysphroic mood and affect. Behavior is normal.      Assessment & Plan:

## 2014-08-23 NOTE — Patient Instructions (Signed)
Please take all new medication as prescribed - the antibiotic  Please increase the metoprolol medication to 1 and 1/2 pills in the AM (for a total of 75 mg)  Please continue all other medications as before, and refills have been done if requested.  Please have the pharmacy call with any other refills you may need.  Please continue your efforts at being more active, low cholesterol diet, and weight control.  You are otherwise up to date with prevention measures today.  Please keep your appointments with your specialists as you may have planned  You are given the copy of your recent lab tests, as well as the most recent EKG, and endoscopy reports  Please remember to sign up for MyChart if you have not done so, as this will be important to you in the future with finding out test results, communicating by private email, and scheduling acute appointments online when needed.  Please return in 6 months, or sooner if needed

## 2014-08-23 NOTE — Assessment & Plan Note (Signed)
?   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

## 2014-08-23 NOTE — Progress Notes (Signed)
Pre visit review using our clinic review tool, if applicable. No additional management support is needed unless otherwise documented below in the visit note. 

## 2014-08-24 NOTE — Assessment & Plan Note (Signed)
Mild to mod, for antibx course,  to f/u any worsening symptoms or concerns 

## 2014-08-24 NOTE — Assessment & Plan Note (Addendum)
Mild elev today, o/w stable overall by history and exam, recent data reviewed with pt, and pt to continue medical treatment as before - declines med changes today,  to f/u any worsening symptoms or concerns BP Readings from Last 3 Encounters:  08/23/14 152/74  05/16/14 113/68  04/29/14 134/64  cont to monitor at home and next visit

## 2014-08-24 NOTE — Assessment & Plan Note (Signed)

## 2014-08-24 NOTE — Assessment & Plan Note (Signed)
stable overall by history and exam and pt to continue medical treatment as before,  to f/u any worsening symptoms or concerns  

## 2014-08-26 ENCOUNTER — Telehealth: Payer: Self-pay | Admitting: Internal Medicine

## 2014-08-26 NOTE — Telephone Encounter (Signed)
emmi emailed °

## 2014-08-28 ENCOUNTER — Other Ambulatory Visit: Payer: Self-pay | Admitting: Internal Medicine

## 2014-09-03 ENCOUNTER — Telehealth: Payer: Self-pay | Admitting: Internal Medicine

## 2014-09-03 NOTE — Telephone Encounter (Signed)
Very sorry, but doctors are no longer allowed by the people who pay for her care (insurance and pharmacies) to prescribe "high risk" medication such as flexeril for persons over 65.

## 2014-09-03 NOTE — Telephone Encounter (Signed)
Patient informed and she stated she has taken flexeril for headaches and is the only thing that works.  Is there an alternative other than tizanidine

## 2014-09-03 NOTE — Telephone Encounter (Signed)
Pt requesting a call from nurse

## 2014-09-03 NOTE — Telephone Encounter (Signed)
Pt wants to see if Dr Jenny Reichmann if he call in cyclobenzaprine (FLEXERIL) 10 MG tablet [[id number]]   She said that she can not take the Tizanidine.

## 2014-09-04 ENCOUNTER — Telehealth: Payer: Self-pay | Admitting: Internal Medicine

## 2014-09-04 MED ORDER — METHOCARBAMOL 500 MG PO TABS: 500.0000 mg | ORAL_TABLET | Freq: Four times a day (QID) | ORAL | Status: DC | PRN

## 2014-09-04 NOTE — Telephone Encounter (Signed)
Called the patient informed of PCP information on medication.  Stated we would attempt to do PA on Robaxin once received a PA form from pharmacy

## 2014-09-04 NOTE — Telephone Encounter (Signed)
Called the patient and she is ok to to try Robaxin.

## 2014-09-04 NOTE — Telephone Encounter (Signed)
Called left msg. To call back 

## 2014-09-04 NOTE — Telephone Encounter (Signed)
Did speak to the patient, see other phone (09/03/14) note regarding PA for Robaxin.

## 2014-09-04 NOTE — Telephone Encounter (Signed)
Patient called to inform Robaxin is not covered under her insurance and would require a PA.  Have not received a form yet.. Advise please.

## 2014-09-04 NOTE — Telephone Encounter (Signed)
Patient returned your phone call.  She left her cell phone number on your VM.  She states for you to give her a call at home number. Thanks.

## 2014-09-04 NOTE — Telephone Encounter (Signed)
Can try robaxin if ok with insurance and pharmacy guidelines

## 2014-09-04 NOTE — Telephone Encounter (Signed)
unfort I dont have any other tx to offer, maybe try the PA for the robaxin, but I think likely to be rejected if no documentation of intolerance to flexeril  But ok to let pt know that I have no control of this medication problem as her insurance coverage is the issue

## 2014-09-10 ENCOUNTER — Other Ambulatory Visit: Payer: Self-pay | Admitting: Internal Medicine

## 2014-09-11 ENCOUNTER — Telehealth: Payer: Self-pay | Admitting: Internal Medicine

## 2014-09-11 NOTE — Telephone Encounter (Signed)
All the best to her as well. noted

## 2014-09-11 NOTE — Telephone Encounter (Signed)
Patient wants to Thank Dr. Jenny Reichmann for authorization for meds.  Also wanted to wish you two a Merry Christmas and a Happy New Year.

## 2014-09-18 ENCOUNTER — Other Ambulatory Visit: Payer: Self-pay | Admitting: Internal Medicine

## 2014-09-22 ENCOUNTER — Other Ambulatory Visit: Payer: Self-pay | Admitting: Internal Medicine

## 2014-10-10 ENCOUNTER — Other Ambulatory Visit: Payer: Self-pay | Admitting: Internal Medicine

## 2014-10-28 ENCOUNTER — Other Ambulatory Visit: Payer: Self-pay | Admitting: Internal Medicine

## 2014-11-03 ENCOUNTER — Other Ambulatory Visit: Payer: Self-pay | Admitting: Internal Medicine

## 2014-11-18 ENCOUNTER — Other Ambulatory Visit: Payer: Self-pay | Admitting: Internal Medicine

## 2014-11-20 ENCOUNTER — Other Ambulatory Visit: Payer: Self-pay | Admitting: Internal Medicine

## 2014-12-06 ENCOUNTER — Other Ambulatory Visit: Payer: Self-pay | Admitting: Internal Medicine

## 2014-12-09 ENCOUNTER — Other Ambulatory Visit: Payer: Self-pay | Admitting: Internal Medicine

## 2015-01-01 ENCOUNTER — Other Ambulatory Visit: Payer: Self-pay | Admitting: Internal Medicine

## 2015-01-18 ENCOUNTER — Other Ambulatory Visit: Payer: Self-pay | Admitting: Internal Medicine

## 2015-01-23 ENCOUNTER — Other Ambulatory Visit: Payer: Self-pay | Admitting: Internal Medicine

## 2015-02-12 ENCOUNTER — Other Ambulatory Visit: Payer: Self-pay | Admitting: Internal Medicine

## 2015-02-25 ENCOUNTER — Ambulatory Visit: Payer: Medicare Other | Admitting: Internal Medicine

## 2015-02-28 ENCOUNTER — Encounter: Payer: Self-pay | Admitting: Internal Medicine

## 2015-02-28 ENCOUNTER — Ambulatory Visit (INDEPENDENT_AMBULATORY_CARE_PROVIDER_SITE_OTHER): Payer: Medicare Other | Admitting: Internal Medicine

## 2015-02-28 VITALS — BP 138/70 | HR 101 | Temp 98.1°F | Ht 59.0 in | Wt 135.0 lb

## 2015-02-28 DIAGNOSIS — I10 Essential (primary) hypertension: Secondary | ICD-10-CM

## 2015-02-28 DIAGNOSIS — E039 Hypothyroidism, unspecified: Secondary | ICD-10-CM | POA: Diagnosis not present

## 2015-02-28 DIAGNOSIS — Z0000 Encounter for general adult medical examination without abnormal findings: Secondary | ICD-10-CM

## 2015-02-28 DIAGNOSIS — F329 Major depressive disorder, single episode, unspecified: Secondary | ICD-10-CM

## 2015-02-28 DIAGNOSIS — Z0189 Encounter for other specified special examinations: Secondary | ICD-10-CM | POA: Diagnosis not present

## 2015-02-28 DIAGNOSIS — F418 Other specified anxiety disorders: Secondary | ICD-10-CM

## 2015-02-28 DIAGNOSIS — F32A Depression, unspecified: Secondary | ICD-10-CM

## 2015-02-28 DIAGNOSIS — F419 Anxiety disorder, unspecified: Secondary | ICD-10-CM

## 2015-02-28 NOTE — Patient Instructions (Signed)
Please continue all other medications as before, and refills have been done if requested.  Please have the pharmacy call with any other refills you may need.  Please continue your efforts at being more active, low cholesterol diet, and weight control..  Please keep your appointments with your specialists as you may have planned  Please return in 6 months, or sooner if needed, with Lab testing done 3-5 days before  

## 2015-02-28 NOTE — Progress Notes (Signed)
Subjective:    Patient ID: Sue Green, female    DOB: 02-21-1946, 69 y.o.   MRN: [id number]  HPI  Here to f/u; overall doing ok,  Pt denies chest pain, increasing sob or doe, wheezing, orthopnea, PND, increased LE swelling, palpitations, dizziness or syncope.  Pt denies new neurological symptoms such as new headache, or facial or extremity weakness or numbness.  Pt denies polydipsia, polyuria, or low sugar episode.   Pt denies new neurological symptoms such as new headache, or facial or extremity weakness or numbness.   Pt states overall good compliance with meds, mostly trying to follow appropriate diet, with wt overall stable,  but little exercise however.  Muscle relaxer still needed daily and seems to be working well.   Pt denies fever, wt loss, night sweats, loss of appetite, or other constitutional symptoms Wt Readings from Last 3 Encounters:  02/28/15 135 lb (61.236 kg)  08/23/14 141 lb 12 oz (64.297 kg)  03/06/14 135 lb (61.236 kg)  Taking a probiotic for bloating symptoms, seems to be helping.  Did have left arm fx per Dr Yvonne Kendall mar 2016. , occurred after fall against a hard endtable playing with her cat. Trhying to follow a lower chol diet.  Starting to get back to water excercises at the gym several times per wk.  Denies hyper or hypo thyroid symptoms such as voice, skin or hair change. Past Medical History  Diagnosis Date  . Hypertension   . Anxiety   . Allergy   . GERD (gastroesophageal reflux disease)   . Asthma   . Thyroid disease     hypothyroidism  . Fibromyalgia   . Osteopenia   . Chronic tension headaches     IN PAST  . Internal hemorrhoids   . Depression   . Glaucoma      Per pt, she does not have glaucoma.  . Arthritis   . Iron deficiency anemia   . Scoliosis   . Anemia, iron deficiency 03/06/2014   Past Surgical History  Procedure Laterality Date  . Tonsillectomy and adenoidectomy      69 years old  . Shoulder arthroscopy  2001    rt shoulder    . Appendectomy  1975    reports that she has never smoked. She has never used smokeless tobacco. She reports that she does not drink alcohol or use illicit drugs. family history includes Arthritis in her mother; Cancer in her father; Diabetes in her father and mother; Pancreatic cancer in her father. There is no history of Colon cancer. Allergies  Allergen Reactions  . Penicillins Hives  . Doxycycline Nausea Only  . Diphenhydramine   . Latex     BREAK OUT IN RASH   Current Outpatient Prescriptions on File Prior to Visit  Medication Sig Dispense Refill  . Ascorbic Acid (VITAMIN C) 500 MG tablet Take 500 mg by mouth daily.      . benzonatate (TESSALON) 100 MG capsule TAKE 2 CAPSULES BY MOUTH 3 TIMES A DAY AS NEEDED FOR COUGH 120 capsule 2  . benzonatate (TESSALON) 100 MG capsule TAKE 2 CAPSULES BY MOUTH 3 TIMES A DAY AS NEEDED FOR COUGH (Patient not taking: Reported on 02/28/2015) 120 capsule 0  . Calcium-Vitamin D-Vitamin K (CALCIUM + D) [phone] MG-UNT-MCG CHEW Chew 1 tablet by mouth 2 (two) times daily.    . cyclobenzaprine (FLEXERIL) 10 MG tablet TAKE 1 TABLET BY MOUTH ONCE DAILY 30 tablet 5  . cyclobenzaprine (FLEXERIL) 10 MG tablet TAKE  1 TABLET BY MOUTH ONCE DAILY (Patient not taking: Reported on 02/28/2015) 30 tablet 10  . desvenlafaxine (PRISTIQ) 100 MG 24 hr tablet Take 100 mg by mouth daily.      Marland Kitchen docusate sodium ([facility] STOOL SOFTENER) 100 MG capsule Take 1 capsule (100 mg total) by mouth daily. 30 capsule 5  . ferrous sulfate 325 (65 FE) MG tablet Take 325 mg by mouth daily with breakfast.    . glucosamine-chondroitin 500-400 MG tablet Take 1 tablet by mouth 2 (two) times daily.     Javier Docker Oil 300 MG CAPS Take by mouth daily.    Marland Kitchen LORazepam (ATIVAN) 1 MG tablet Take 1 mg by mouth 2 (two) times daily as needed.      . methocarbamol (ROBAXIN) 500 MG tablet Take 1 tablet (500 mg total) by mouth every 6 (six) hours as needed for muscle spasms. 60 tablet 1  . Methylcellulose,  Laxative, (CITRUCEL) 500 MG TABS Take by mouth daily.    . metoprolol succinate (TOPROL-XL) 50 MG 24 hr tablet Take one and 1/2 tabs by mouth in the AM daily 45 tablet 12  . montelukast (SINGULAIR) 10 MG tablet TAKE 1 TABLET BY MOUTH EVERY DAY 30 tablet 11  . Multiple Vitamins-Minerals (ONE-A-DAY WOMENS 50+ ADVANTAGE) TABS Take 1 tablet by mouth daily.    . naproxen sodium (ALEVE) 220 MG tablet Take 220 mg by mouth daily.     . pantoprazole (PROTONIX) 40 MG tablet TAKE 1 TABLET EVERY DAY 30 tablet 11  . polyethylene glycol powder (GLYCOLAX/MIRALAX) powder TAKE 17GRAMS (1 CAPFUL) IN 8 OUNCES OF WATER ONCE DAILY 527 g 10  . SYNTHROID 75 MCG tablet TAKE 1 TABLET (75 MCG TOTAL) BY MOUTH DAILY. 90 tablet 3  . VESICARE 5 MG tablet TAKE 1 TABLET BY MOUTH EVERY DAY 30 tablet 11   No current facility-administered medications on file prior to visit.       Review of Systems  Constitutional: Negative for unusual diaphoresis or night sweats HENT: Negative for ringing in ear or discharge Eyes: Negative for double vision or worsening visual disturbance.  Respiratory: Negative for choking and stridor.   Gastrointestinal: Negative for vomiting or other signifcant bowel change Genitourinary: Negative for hematuria or change in urine volume.  Musculoskeletal: Negative for other MSK pain or swelling Skin: Negative for color change and worsening wound.  Neurological: Negative for tremors and numbness other than noted  Psychiatric/Behavioral: Negative for decreased concentration or agitation other than above       Objective:   Physical Exam BP 138/70 mmHg  Pulse 101  Temp(Src) 98.1 F (36.7 C) (Oral)  Ht 4\' 11"  (1.499 m)  Wt 135 lb (61.236 kg)  BMI 27.25 kg/m2  SpO2 97% VS noted,  Constitutional: Pt appears in no significant distress HENT: Head: NCAT.  Right Ear: External ear normal.  Left Ear: External ear normal.  Eyes: . Pupils are equal, round, and reactive to light. Conjunctivae and EOM  are normal Neck: Normal range of motion. Neck supple.  Cardiovascular: Normal rate and regular rhythm.   Pulmonary/Chest: Effort normal and breath sounds without rales or wheezing.  Neurological: Pt is alert. Not confused , motor grossly intact Skin: Skin is warm. No rash, no LE edema Psychiatric: Pt behavior is normal. No agitation.      Assessment & Plan:

## 2015-02-28 NOTE — Progress Notes (Signed)
Pre visit review using our clinic review tool, if applicable. No additional management support is needed unless otherwise documented below in the visit note. 

## 2015-02-28 NOTE — Assessment & Plan Note (Signed)
stable overall by history and exam, recent data reviewed with pt, and pt to continue medical treatment as before,  to f/u any worsening symptoms or concerns Lab Results  Component Value Date   WBC 9.3 08/20/2014   HGB 14.1 08/20/2014   HCT 41.8 08/20/2014   PLT 389.0 08/20/2014   GLUCOSE 94 08/20/2014   CHOL 192 08/20/2014   TRIG 233.0* 08/20/2014   HDL 38.20* 08/20/2014   LDLDIRECT 126.9 08/20/2014   LDLCALC 119* 07/31/2013   ALT 23 08/20/2014   AST 22 08/20/2014   NA 135 08/20/2014   K 4.6 08/20/2014   CL 101 08/20/2014   CREATININE 0.7 08/20/2014   BUN 10 08/20/2014   CO2 24 08/20/2014   TSH 2.13 08/20/2014

## 2015-02-28 NOTE — Assessment & Plan Note (Signed)
stable overall by history and exam, recent data reviewed with pt, and pt to continue medical treatment as before,  to f/u any worsening symptoms or concerns Lab Results  Component Value Date   TSH 2.13 08/20/2014

## 2015-02-28 NOTE — Assessment & Plan Note (Signed)
stable overall by history and exam, recent data reviewed with pt, and pt to continue medical treatment as before,  to f/u any worsening symptoms or concerns BP Readings from Last 3 Encounters:  02/28/15 138/70  08/23/14 152/74  05/16/14 113/68

## 2015-03-02 ENCOUNTER — Other Ambulatory Visit: Payer: Self-pay | Admitting: Internal Medicine

## 2015-03-19 ENCOUNTER — Other Ambulatory Visit: Payer: Self-pay | Admitting: Internal Medicine

## 2015-04-06 ENCOUNTER — Other Ambulatory Visit: Payer: Self-pay | Admitting: Internal Medicine

## 2015-04-11 ENCOUNTER — Telehealth: Payer: Self-pay | Admitting: Internal Medicine

## 2015-04-11 NOTE — Telephone Encounter (Signed)
Pt called in and said that she went to pick her benzonatate (TESSALON) 100 MG capsule [[id number]]  And they are now $87.59, she can not afford this any longer.  They use to be only $7.00.  She wanted to know if she still needed to take them and if so is there anything else that compares that would be cheaper?      Best number cell [phone]

## 2015-04-11 NOTE — Telephone Encounter (Signed)
Pt will try OTC Delsym

## 2015-04-11 NOTE — Telephone Encounter (Signed)
Unfortunately there is nothing else similar to this pill; delsym otc can help however  I wonder if she has checked at [facility], as they have been very inexpensive there in the past

## 2015-05-29 ENCOUNTER — Ambulatory Visit: Payer: Medicare Other | Admitting: Internal Medicine

## 2015-05-30 ENCOUNTER — Ambulatory Visit (INDEPENDENT_AMBULATORY_CARE_PROVIDER_SITE_OTHER): Payer: Medicare Other | Admitting: Internal Medicine

## 2015-05-30 ENCOUNTER — Encounter: Payer: Self-pay | Admitting: Internal Medicine

## 2015-05-30 ENCOUNTER — Other Ambulatory Visit (INDEPENDENT_AMBULATORY_CARE_PROVIDER_SITE_OTHER): Payer: Medicare Other

## 2015-05-30 VITALS — BP 130/70 | HR 79 | Temp 97.8°F | Ht 59.0 in | Wt 133.0 lb

## 2015-05-30 DIAGNOSIS — R202 Paresthesia of skin: Secondary | ICD-10-CM

## 2015-05-30 DIAGNOSIS — E039 Hypothyroidism, unspecified: Secondary | ICD-10-CM

## 2015-05-30 DIAGNOSIS — Z23 Encounter for immunization: Secondary | ICD-10-CM

## 2015-05-30 LAB — TROPONIN I: TNIDX: 0 ug/L (ref 0.00–0.06)

## 2015-05-30 LAB — TSH: TSH: 1.29 u[IU]/mL (ref 0.35–4.50)

## 2015-05-30 NOTE — Patient Instructions (Signed)
Go to Web M.D. for information on Carpal Tunnel Syndrome. Wear a wrist splint  on the left hand.  If symptoms persist or progress; nerve conduction/EMG studies would be indicated. If these were abnormal, Hand Surgery referral would be indicated.   Your next office appointment will be determined based upon review of your pending labs.Those written interpretation of the lab results and instructions will be transmitted to you by mail for your records.  Critical results will be called.   Followup as needed for any active or acute issue. Please report any significant change in your symptoms.

## 2015-05-30 NOTE — Progress Notes (Signed)
Pre visit review using our clinic review tool, if applicable. No additional management support is needed unless otherwise documented below in the visit note. 

## 2015-05-30 NOTE — Progress Notes (Signed)
   Subjective:    Patient ID: Sue Green, female    DOB: 1946-05-29, 69 y.o.   MRN: [id number]  HPI  For the last 2-3 weeks she's had intermittent tingling 3-10 times per day lasting 15 seconds in the left upper extremity. For the most part this is from the elbow down. Occasionally she believes it may involve the whole upper extremity. Her history is variable and vague.  She is concerned as a friend had tingling in her left upper extremity and had a heart attack. She wants "blood tests for the heart".  She also has had bloating for 6 months for which she takes probiotics. She did see her gynecologist in July. Apparently there was no ovarian pathology.  She does have hypothyroidism. Her last TSH was 2.13 in December 2015.  Past medical history includes diagnoses of memory loss; alcohol abuse; anxiety and depression; and fibromyalgia.  Review of Systems Fever, chills, sweats, or unexplained weight loss not present. No significant headaches. Mental status change or memory loss denied. Blurred vision , diplopia or vision loss absent. Vertigo, near syncope or imbalance denied. There is no weakness in extremities.   No loss of control of bladder or bowels. Radicular type pain absent. No seizure stigmata. Chest pain, palpitations, tachycardia, exertional dyspnea, paroxysmal nocturnal dyspnea, claudication or edema are absent.      Objective:   Physical Exam  Pertinent or positive findings include: Cranial nerve exam is unremarkable. She has bilateral ptosis. Abdomen is protuberant. She has DIP osteoarthritic changes. There is crepitus of the knees greater on the right than the left. Dorsalis pedis pulses are decreased. She has no ischemic change in the feet  General appearance :adequately nourished; in no distress.  Eyes: No conjunctival inflammation or scleral icterus is present.  Oral exam:  Lips and gums are healthy appearing.There is no oropharyngeal erythema or exudate  noted. Dental hygiene is good.  Heart:  Normal rate and regular rhythm. S1 and S2 normal without gallop, murmur, click, rub or other extra sounds    Lungs:Chest clear to auscultation; no wheezes, rhonchi,rales ,or rubs present.No increased work of breathing.   Abdomen: bowel sounds normal, soft and non-tender without masses, organomegaly or hernias noted.  No guarding or rebound.   Vascular : all pulses equal ; no bruits present.  Skin:Warm & dry.  Intact without suspicious lesions or rashes ; no tenting or jaundice   Lymphatic: No lymphadenopathy is noted about the head, neck, axilla.   Neuro: Strength, tone & DTRs normal.      Assessment & Plan:  #1 paresthesias of the left upper extremity, mainly from the elbow down. This is in the context of fractured wrist in 3/16. Most likely etiology is carpal tunnel syndrome.  She is fixated that this is evidence of coronary artery syndrome  Plan: See after visit summary and orders.

## 2015-07-30 ENCOUNTER — Other Ambulatory Visit: Payer: Self-pay | Admitting: Internal Medicine

## 2015-08-08 ENCOUNTER — Telehealth: Payer: Self-pay | Admitting: Internal Medicine

## 2015-08-08 MED ORDER — POLYETHYLENE GLYCOL 3350 17 GM/SCOOP PO POWD: ORAL | Status: DC

## 2015-08-08 NOTE — Telephone Encounter (Signed)
Pt called in and needs refill on her polyethylene glycol powder (GLYCOLAX/MIRALAX) powder [id number]   She said that she is completely out   [facility]

## 2015-09-03 ENCOUNTER — Encounter: Payer: Medicare Other | Admitting: Internal Medicine

## 2015-09-10 ENCOUNTER — Encounter: Payer: Self-pay | Admitting: Internal Medicine

## 2015-09-10 ENCOUNTER — Ambulatory Visit (INDEPENDENT_AMBULATORY_CARE_PROVIDER_SITE_OTHER): Payer: Medicare Other | Admitting: Internal Medicine

## 2015-09-10 ENCOUNTER — Other Ambulatory Visit (INDEPENDENT_AMBULATORY_CARE_PROVIDER_SITE_OTHER): Payer: Medicare Other

## 2015-09-10 ENCOUNTER — Telehealth: Payer: Self-pay | Admitting: Internal Medicine

## 2015-09-10 VITALS — BP 118/72 | HR 85 | Temp 97.7°F | Ht 59.0 in | Wt 132.0 lb

## 2015-09-10 DIAGNOSIS — I10 Essential (primary) hypertension: Secondary | ICD-10-CM

## 2015-09-10 DIAGNOSIS — R7989 Other specified abnormal findings of blood chemistry: Secondary | ICD-10-CM | POA: Diagnosis not present

## 2015-09-10 DIAGNOSIS — Z0000 Encounter for general adult medical examination without abnormal findings: Secondary | ICD-10-CM

## 2015-09-10 DIAGNOSIS — K5900 Constipation, unspecified: Secondary | ICD-10-CM

## 2015-09-10 DIAGNOSIS — E039 Hypothyroidism, unspecified: Secondary | ICD-10-CM

## 2015-09-10 LAB — CBC WITH DIFFERENTIAL/PLATELET
BASOS ABS: 0 10*3/uL (ref 0.0–0.1)
BASOS PCT: 0.5 % (ref 0.0–3.0)
EOS ABS: 0.2 10*3/uL (ref 0.0–0.7)
Eosinophils Relative: 1.8 % (ref 0.0–5.0)
HEMATOCRIT: 37.3 % (ref 36.0–46.0)
HEMOGLOBIN: 12.4 g/dL (ref 12.0–15.0)
LYMPHS PCT: 17.6 % (ref 12.0–46.0)
Lymphs Abs: 1.5 10*3/uL (ref 0.7–4.0)
MCHC: 33.4 g/dL (ref 30.0–36.0)
MCV: 99.6 fl (ref 78.0–100.0)
MONOS PCT: 8.4 % (ref 3.0–12.0)
Monocytes Absolute: 0.7 10*3/uL (ref 0.1–1.0)
NEUTROS ABS: 6.2 10*3/uL (ref 1.4–7.7)
Neutrophils Relative %: 71.7 % (ref 43.0–77.0)
PLATELETS: 371 10*3/uL (ref 150.0–400.0)
RBC: 3.74 Mil/uL — ABNORMAL LOW (ref 3.87–5.11)
RDW: 14 % (ref 11.5–15.5)
WBC: 8.7 10*3/uL (ref 4.0–10.5)

## 2015-09-10 LAB — BASIC METABOLIC PANEL
BUN: 15 mg/dL (ref 6–23)
CALCIUM: 9 mg/dL (ref 8.4–10.5)
CO2: 29 mEq/L (ref 19–32)
CREATININE: 0.67 mg/dL (ref 0.40–1.20)
Chloride: 99 mEq/L (ref 96–112)
GFR: 92.53 mL/min (ref >60.00)
Glucose, Bld: 90 mg/dL (ref 70–99)
Potassium: 4.4 mEq/L (ref 3.5–5.1)
Sodium: 133 mEq/L — ABNORMAL LOW (ref 135–145)

## 2015-09-10 LAB — URINALYSIS, ROUTINE W REFLEX MICROSCOPIC
BILIRUBIN URINE: NEGATIVE
Hgb urine dipstick: NEGATIVE
KETONES UR: NEGATIVE
LEUKOCYTES UA: NEGATIVE
Nitrite: NEGATIVE
PH: 6.5 (ref 5.0–8.0)
RBC / HPF: NONE SEEN (ref 0–?)
Specific Gravity, Urine: <=1.005 — AB (ref 1.000–1.030)
Total Protein, Urine: NEGATIVE
UROBILINOGEN UA: 0.2 (ref 0.0–1.0)
Urine Glucose: NEGATIVE

## 2015-09-10 LAB — HEPATIC FUNCTION PANEL
ALT: 13 U/L (ref 0–35)
AST: 15 U/L (ref 0–37)
Albumin: 4 g/dL (ref 3.5–5.2)
Alkaline Phosphatase: 68 U/L (ref 39–117)
BILIRUBIN DIRECT: 0 mg/dL (ref 0.0–0.3)
BILIRUBIN TOTAL: 0.2 mg/dL (ref 0.2–1.2)
Total Protein: 6.5 g/dL (ref 6.0–8.3)

## 2015-09-10 LAB — LIPID PANEL
CHOL/HDL RATIO: 4
CHOLESTEROL: 168 mg/dL (ref 0–200)
HDL: 43.5 mg/dL (ref >39.00)
NONHDL: 124.76
TRIGLYCERIDES: 203 mg/dL — AB (ref 0.0–149.0)
VLDL: 40.6 mg/dL — AB (ref 0.0–40.0)

## 2015-09-10 LAB — LDL CHOLESTEROL, DIRECT: LDL DIRECT: 105 mg/dL

## 2015-09-10 LAB — TSH: TSH: 0.83 u[IU]/mL (ref 0.35–4.50)

## 2015-09-10 NOTE — Patient Instructions (Signed)

## 2015-09-10 NOTE — Progress Notes (Signed)
Subjective:    Patient ID: Sue Green, female    DOB: [birth year], 69 y.o.   MRN: [id number]  HPI  Here for wellness and f/u;  Overall doing ok;  Pt denies Chest pain, worsening SOB, DOE, wheezing, orthopnea, PND, worsening LE edema, palpitations, dizziness or syncope.  Pt denies neurological change such as new headache, facial or extremity weakness.  Pt denies polydipsia, polyuria, or low sugar symptoms. Pt states overall good compliance with treatment and medications, good tolerability, and has been trying to follow appropriate diet.  Pt denies worsening depressive symptoms, suicidal ideation or panic. No fever, night sweats, wt loss, loss of appetite, or other constitutional symptoms.  Pt states good ability with ADL's, has low fall risk, home safety reviewed and adequate, no other significant changes in hearing or vision, and only occasionally active with exercise.  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  Sees psychiatry, will think about counseling, but declines for now. Has lost several lbs, but feels increasingly bloated and abd distended but Denies worsening reflux, abd pain, dysphagia, n/v, bowel change or blood., and no hx of ascites. Wt Readings from Last 3 Encounters:  09/10/15 132 lb (59.875 kg)  05/30/15 133 lb (60.328 kg)  02/28/15 135 lb (61.236 kg)   Past Medical History  Diagnosis Date  . Hypertension   . Anxiety   . Allergy   . GERD (gastroesophageal reflux disease)   . Asthma   . Thyroid disease     hypothyroidism  . Fibromyalgia   . Osteopenia   . Chronic tension headaches     IN PAST  . Internal hemorrhoids   . Depression   . Glaucoma      Per pt, she does not have glaucoma.  . Arthritis   . Iron deficiency anemia   . Scoliosis   . Anemia, iron deficiency 03/06/2014   Past Surgical History  Procedure Laterality Date  . Tonsillectomy and adenoidectomy      69 years old  . Shoulder  arthroscopy  2001    rt shoulder  . Appendectomy  1975    reports that she has never smoked. She has never used smokeless tobacco. She reports that she does not drink alcohol or use illicit drugs. family history includes Arthritis in her mother; Cancer in her father; Diabetes in her father and mother; Pancreatic cancer in her father. There is no history of Colon cancer. Allergies  Allergen Reactions  . Penicillins Hives  . Doxycycline Nausea Only  . Diphenhydramine   . Latex     BREAK OUT IN RASH   Current Outpatient Prescriptions on File Prior to Visit  Medication Sig Dispense Refill  . Ascorbic Acid (VITAMIN C) 500 MG tablet Take 500 mg by mouth daily.      . Calcium-Vitamin D-Vitamin K (CALCIUM + D) [id number] MG-UNT-MCG CHEW Chew 1 tablet by mouth 2 (two) times daily.    . cyclobenzaprine (FLEXERIL) 10 MG tablet TAKE 1 TABLET BY MOUTH ONCE DAILY 30 tablet 5  . desvenlafaxine (PRISTIQ) 100 MG 24 hr tablet Take 100 mg by mouth daily.      . ferrous sulfate 325 (65 FE) MG tablet Take 325 mg by mouth daily with breakfast.    . glucosamine-chondroitin 500-400 MG tablet Take 1 tablet by mouth 2 (two) times daily.     Javier Docker Oil 300 MG CAPS Take by mouth daily.    Marland Kitchen  LORazepam (ATIVAN) 1 MG tablet Take 1 mg by mouth 2 (two) times daily as needed.      . Methylcellulose, Laxative, (CITRUCEL) 500 MG TABS Take by mouth daily. Reported on 09/10/2015    . metoprolol succinate (TOPROL-XL) 50 MG 24 hr tablet Take one and 1/2 tabs by mouth in the AM daily 45 tablet 12  . montelukast (SINGULAIR) 10 MG tablet TAKE 1 TABLET BY MOUTH EVERY DAY 30 tablet 11  . Multiple Vitamins-Minerals (ONE-A-DAY WOMENS 50+ ADVANTAGE) TABS Take 1 tablet by mouth daily.    . naproxen sodium (ALEVE) 220 MG tablet Take 220 mg by mouth daily.     Marland Kitchen OVER THE COUNTER MEDICATION 2 (two) times daily.     . pantoprazole (PROTONIX) 40 MG tablet TAKE 1 TABLET EVERY DAY 30 tablet 11  . SYNTHROID 75 MCG tablet TAKE 1 TABLET (75  MCG TOTAL) BY MOUTH DAILY. 90 tablet 3  . VESICARE 5 MG tablet TAKE 1 TABLET BY MOUTH EVERY DAY 30 tablet 11  . benzonatate (TESSALON) 100 MG capsule TAKE 2 CAPSULES BY MOUTH 3 TIMES A DAY AS NEEDED FOR COUGH (Patient not taking: Reported on 09/10/2015) 120 capsule 2  . docusate sodium ([facility] STOOL SOFTENER) 100 MG capsule Take 1 capsule (100 mg total) by mouth daily. (Patient not taking: Reported on 09/10/2015) 30 capsule 5  . methocarbamol (ROBAXIN) 500 MG tablet Take 1 tablet (500 mg total) by mouth every 6 (six) hours as needed for muscle spasms. (Patient not taking: Reported on 09/10/2015) 60 tablet 1  . polyethylene glycol powder (GLYCOLAX/MIRALAX) powder TAKE 17GRAMS (1 CAPFUL) IN 8 OUNCES OF WATER ONCE DAILY (Patient not taking: Reported on 09/10/2015) 527 g 5   No current facility-administered medications on file prior to visit.   Review of Systems Constitutional: Negative for increased diaphoresis, other activity, appetite or siginficant weight change other than noted HENT: Negative for worsening hearing loss, ear pain, facial swelling, mouth sores and neck stiffness.   Eyes: Negative for other worsening pain, redness or visual disturbance.  Respiratory: Negative for shortness of breath and wheezing  Cardiovascular: Negative for chest pain and palpitations.  Gastrointestinal: Negative for diarrhea, blood in stool, abdominal distention or other pain Genitourinary: Negative for hematuria, flank pain or change in urine volume.  Musculoskeletal: Negative for myalgias or other joint complaints.  Skin: Negative for color change and wound or drainage.  Neurological: Negative for syncope and numbness. other than noted Hematological: Negative for adenopathy. or other swelling Psychiatric/Behavioral: Negative for hallucinations, SI, self-injury, decreased concentration or other worsening agitation.      Objective:   Physical Exam BP 118/72 mmHg  Pulse 85  Temp(Src) 97.7 F (36.5 C) (Oral)   Ht 4\' 11"  (1.499 m)  Wt 132 lb (59.875 kg)  BMI 26.65 kg/m2  SpO2 99% VS noted,  Constitutional: Pt is oriented to person, place, and time. Appears well-developed and well-nourished, in no significant distress Head: Normocephalic and atraumatic.  Right Ear: External ear normal.  Left Ear: External ear normal.  Nose: Nose normal.  Mouth/Throat: Oropharynx is clear and moist.  Eyes: Conjunctivae and EOM are normal. Pupils are equal, round, and reactive to light.  Neck: Normal range of motion. Neck supple. No JVD present. No tracheal deviation present or significant neck LA or mass Cardiovascular: Normal rate, regular rhythm, normal heart sounds and intact distal pulses.   Pulmonary/Chest: Effort normal and breath sounds without rales or wheezing  Abdominal: Soft. Bowel sounds are normal. NT. No HSM  Musculoskeletal: Normal range of motion. Exhibits no edema.  Lymphadenopathy:  Has no cervical adenopathy.  Neurological: Pt is alert and oriented to person, place, and time. Pt has normal reflexes. No cranial nerve deficit. Motor grossly intact Skin: Skin is warm and dry. No rash noted.  Psychiatric:  Has normal mood and affect. Behavior is normal.     Assessment & Plan:

## 2015-09-10 NOTE — Progress Notes (Signed)
Pre visit review using our clinic review tool, if applicable. No additional management support is needed unless otherwise documented below in the visit note. 

## 2015-09-10 NOTE — Telephone Encounter (Signed)
Noted  

## 2015-09-10 NOTE — Assessment & Plan Note (Signed)

## 2015-09-10 NOTE — Telephone Encounter (Signed)
Pt came back in front office area after her visit today and requested you please call her regarding her urinalysis & lab results once you get them.

## 2015-09-11 LAB — HEPATITIS C ANTIBODY: HCV Ab: NEGATIVE

## 2015-09-13 NOTE — Assessment & Plan Note (Signed)
Ok for [employer] prn

## 2015-09-18 ENCOUNTER — Other Ambulatory Visit: Payer: Self-pay | Admitting: Internal Medicine

## 2015-11-03 ENCOUNTER — Other Ambulatory Visit: Payer: Self-pay | Admitting: Internal Medicine

## 2015-11-15 ENCOUNTER — Other Ambulatory Visit: Payer: Self-pay | Admitting: Internal Medicine

## 2015-12-08 ENCOUNTER — Other Ambulatory Visit: Payer: Self-pay | Admitting: Internal Medicine

## 2015-12-25 ENCOUNTER — Telehealth: Payer: Self-pay | Admitting: Gastroenterology

## 2015-12-25 NOTE — Telephone Encounter (Signed)
Patient reports a "piece of tissue" that is hanging out of my rectum.  She reports occasional irritation.  She is advised that I will add her to the wait list.  She will keep the appt for 02/10/16 for now.  She asked that if we have a cancellation we can leave a message on her home machine.

## 2016-01-11 ENCOUNTER — Other Ambulatory Visit: Payer: Self-pay | Admitting: Internal Medicine

## 2016-01-22 ENCOUNTER — Other Ambulatory Visit: Payer: Self-pay | Admitting: Internal Medicine

## 2016-01-25 ENCOUNTER — Other Ambulatory Visit: Payer: Self-pay | Admitting: Internal Medicine

## 2016-02-10 ENCOUNTER — Ambulatory Visit (INDEPENDENT_AMBULATORY_CARE_PROVIDER_SITE_OTHER): Payer: Medicare Other | Admitting: Gastroenterology

## 2016-02-10 ENCOUNTER — Encounter: Payer: Self-pay | Admitting: Gastroenterology

## 2016-02-10 VITALS — BP 110/60 | HR 75 | Ht 59.0 in | Wt 130.0 lb

## 2016-02-10 DIAGNOSIS — K629 Disease of anus and rectum, unspecified: Secondary | ICD-10-CM | POA: Diagnosis not present

## 2016-02-10 NOTE — Patient Instructions (Signed)
Use baby wipes on the outside of the rectum to clean the area after a bowel movement. Do not stick any tissue paper or creams inside your rectum.  If you have any other issues then please contact our office.   Thank you for choosing me and [facility].  Pricilla Riffle. Dagoberto Ligas., MD., Mar[facility] Regal

## 2016-02-10 NOTE — Progress Notes (Signed)
    History of Present Illness: This is a 70 year old female who has noted a flap of skin at her anus for the past few months. No pain, bleeding or other symptoms. She has not noted that it has changed in size since she first noticed it. Colonoscopy in 07/2011 was normal  Current Medications, Allergies, Past Medical History, Past Surgical History, Family History and Social History were reviewed in [employer] record.  Physical Exam: General: Well developed, well nourished, no acute distress Head: Normocephalic and atraumatic Eyes:  sclerae anicteric, EOMI Ears: Normal auditory acuity Mouth: No deformity or lesions Lungs: Clear throughout to auscultation Heart: Regular rate and rhythm; no murmurs, rubs or bruits Abdomen: Soft, non tender and non distended. No masses, hepatosplenomegaly or hernias noted. Normal Bowel sounds Rectal: small external anal skin tag, no internal lesions, no tenderness, heme neg soft brown stool in the vault Musculoskeletal: Symmetrical with no gross deformities  Pulses:  Normal pulses noted Extremities: No clubbing, cyanosis, edema or deformities noted Neurological: Alert oriented x 4, grossly nonfocal Psychological:  Alert and cooperative. Normal mood and affect  Assessment and Recommendations:  1. Small external anal skin tag. Pt reassured. She is advised to use only baby wipes and toilet paper to clean her rectum externally. She is advised to avoid creams or topical medications and to avoid any internal anal canal or rectal manipulation. She is further advised to call if she experiences any new symptoms.

## 2016-03-08 ENCOUNTER — Telehealth: Payer: Self-pay

## 2016-03-08 DIAGNOSIS — R040 Epistaxis: Secondary | ICD-10-CM

## 2016-03-08 NOTE — Telephone Encounter (Signed)
Patient is wanting a referral to see a ENT doctor. Please follow up.

## 2016-03-10 NOTE — Telephone Encounter (Signed)
I am uncomfortable with ordering a referral to ENT without a reason. Thanks.  Pt last seen dec 2016

## 2016-03-10 NOTE — Telephone Encounter (Signed)
Please advise 

## 2016-03-11 NOTE — Telephone Encounter (Signed)
Unable to reach patient, left message to give [place] a call back

## 2016-03-16 NOTE — Addendum Note (Signed)
Addended by: Biagio Borg on: 03/16/2016 02:45 PM   Modules accepted: Orders

## 2016-03-16 NOTE — Telephone Encounter (Signed)
Referral done  No further pneumonia shots needed per current guidelines of either pneumonia shot (she has had both recently)

## 2016-03-16 NOTE — Telephone Encounter (Signed)
Patient needs referral for right nose bleeds.  Patient also would like to know when she needs to get a pneumonia shot.

## 2016-03-16 NOTE — Telephone Encounter (Signed)
Please advise 

## 2016-03-19 ENCOUNTER — Other Ambulatory Visit: Payer: Self-pay | Admitting: Internal Medicine

## 2016-04-12 NOTE — Progress Notes (Signed)
Erroneous encounter

## 2016-05-31 ENCOUNTER — Other Ambulatory Visit (HOSPITAL_COMMUNITY): Payer: Self-pay | Admitting: Psychiatry

## 2016-06-14 ENCOUNTER — Other Ambulatory Visit: Payer: Self-pay | Admitting: Internal Medicine

## 2016-07-23 ENCOUNTER — Other Ambulatory Visit: Payer: Self-pay | Admitting: Internal Medicine

## 2016-07-28 ENCOUNTER — Ambulatory Visit: Payer: Medicare Other | Admitting: Nurse Practitioner

## 2016-08-24 ENCOUNTER — Other Ambulatory Visit: Payer: Self-pay | Admitting: *Deleted

## 2016-08-24 MED ORDER — SOLIFENACIN SUCCINATE 5 MG PO TABS: 5.0000 mg | ORAL_TABLET | Freq: Every day | ORAL | 0 refills | Status: DC

## 2016-09-10 ENCOUNTER — Encounter: Payer: Medicare Other | Admitting: Internal Medicine

## 2016-09-22 ENCOUNTER — Other Ambulatory Visit: Payer: Self-pay | Admitting: *Deleted

## 2016-09-22 ENCOUNTER — Other Ambulatory Visit: Payer: Self-pay | Admitting: Internal Medicine

## 2016-09-22 MED ORDER — SOLIFENACIN SUCCINATE 5 MG PO TABS: 5.0000 mg | ORAL_TABLET | Freq: Every day | ORAL | 0 refills | Status: DC

## 2016-09-22 NOTE — Telephone Encounter (Signed)
Left msg on triage needing to get a refill on her vesicare. Pt has made appt for 10/08/16 will send 30 day to [facility].../lmb

## 2016-10-05 ENCOUNTER — Encounter: Payer: Self-pay | Admitting: Internal Medicine

## 2016-10-05 ENCOUNTER — Ambulatory Visit (INDEPENDENT_AMBULATORY_CARE_PROVIDER_SITE_OTHER): Payer: Medicare Other | Admitting: Internal Medicine

## 2016-10-05 VITALS — BP 138/78 | HR 86 | Temp 98.6°F | Resp 20 | Wt 131.0 lb

## 2016-10-05 DIAGNOSIS — J069 Acute upper respiratory infection, unspecified: Secondary | ICD-10-CM

## 2016-10-05 DIAGNOSIS — I10 Essential (primary) hypertension: Secondary | ICD-10-CM

## 2016-10-05 DIAGNOSIS — R300 Dysuria: Secondary | ICD-10-CM | POA: Diagnosis not present

## 2016-10-05 MED ORDER — LEVOFLOXACIN 250 MG PO TABS: 250.0000 mg | ORAL_TABLET | Freq: Every day | ORAL | 0 refills | Status: AC

## 2016-10-05 NOTE — Assessment & Plan Note (Signed)
Mild to mod, for antibx course,  to f/u any worsening symptoms or concerns 

## 2016-10-05 NOTE — Patient Instructions (Signed)
Please take all new medication as prescribed - the antibiotic  Please continue all other medications as before, and refills have been done if requested.  Please have the pharmacy call with any other refills you may need.  Please keep your appointments with your specialists as you may have planned   

## 2016-10-05 NOTE — Progress Notes (Signed)
Pre visit review using our clinic review tool, if applicable. No additional management support is needed unless otherwise documented below in the visit note. 

## 2016-10-05 NOTE — Progress Notes (Signed)
Subjective:    Patient ID: Sue Green, female    DOB: 1946/08/26, 71 y.o.   MRN: [id number]  HPI   Here with 2-3 days acute onset fever, facial pain, pressure, headache, general weakness and malaise, and greenish d/c, with mild ST and cough, but pt denies chest pain, wheezing, increased sob or doe, orthopnea, PND, increased LE swelling, palpitations, dizziness or syncope. Also c/o dysuria and colorless urine for several days. Drinks multiple kinds of copious fluids.  Denies urinary symptoms such as frequency, urgency, flank pain, hematuria.  Pt denies polydipsia, polyuria.  No other new history Past Medical History:  Diagnosis Date  . Allergy   . Anemia, iron deficiency 03/06/2014  . Anxiety   . Arthritis   . Asthma   . Chronic tension headaches    IN PAST  . Depression   . Fibromyalgia   . GERD (gastroesophageal reflux disease)   . Glaucoma     Per pt, she does not have glaucoma.  . Hypertension   . Internal hemorrhoids   . Iron deficiency anemia   . Osteopenia   . Pyloric stenosis   . Scoliosis   . Thyroid disease    hypothyroidism   Past Surgical History:  Procedure Laterality Date  . APPENDECTOMY  1975  . SHOULDER ARTHROSCOPY  2001   rt shoulder  . TONSILLECTOMY AND ADENOIDECTOMY     71 years old    reports that she has never smoked. She has never used smokeless tobacco. She reports that she does not drink alcohol or use drugs. family history includes Arthritis in her mother; Cancer in her father; Diabetes in her father and mother; Pancreatic cancer in her father. Allergies  Allergen Reactions  . Penicillins Hives  . Doxycycline Nausea Only  . Antihistamines, Diphenhydramine-Type   . Diphenhydramine   . Latex     BREAK OUT IN RASH   Current Outpatient Prescriptions on File Prior to Visit  Medication Sig Dispense Refill  . Ascorbic Acid (VITAMIN C) 500 MG tablet Take 500 mg by mouth daily.      . Calcium-Vitamin D-Vitamin K (CALCIUM + D) [phone]  MG-UNT-MCG CHEW Chew 1 tablet by mouth 2 (two) times daily.    Marland Kitchen desvenlafaxine (PRISTIQ) 100 MG 24 hr tablet Take 100 mg by mouth daily.      . ferrous sulfate 325 (65 FE) MG tablet Take 325 mg by mouth daily with breakfast.    . glucosamine-chondroitin 500-400 MG tablet Take 1 tablet by mouth 2 (two) times daily.     Javier Docker Oil 300 MG CAPS Take by mouth daily.    Marland Kitchen LORazepam (ATIVAN) 1 MG tablet Take 1 mg by mouth 2 (two) times daily as needed.      . methylcellulose oral powder Take 1 packet by mouth daily.    . metoprolol succinate (TOPROL-XL) 50 MG 24 hr tablet TAKE ONE AND 1/2 TABS BY MOUTH IN THE AM DAILY 45 tablet 5  . montelukast (SINGULAIR) 10 MG tablet TAKE 1 TABLET BY MOUTH EVERY DAY 30 tablet 11  . Multiple Vitamins-Minerals (ONE-A-DAY WOMENS 50+ ADVANTAGE) TABS Take 1 tablet by mouth daily.    Marland Kitchen OVER THE COUNTER MEDICATION 2 (two) times daily.     . pantoprazole (PROTONIX) 40 MG tablet TAKE 1 TABLET EVERY DAY 30 tablet 11  . polyethylene glycol powder (GLYCOLAX/MIRALAX) powder TAKE 17GRAMS (1 CAPFUL) IN 8 OUNCES OF WATER ONCE DAILY 527 g 5  . solifenacin (VESICARE) 5 MG tablet Take  1 tablet (5 mg total) by mouth daily. Keep January appt for future refills 30 tablet 0  . SYNTHROID 75 MCG tablet TAKE 1 TABLET (75 MCG TOTAL) BY MOUTH DAILY. 90 tablet 3   No current facility-administered medications on file prior to visit.    Review of Systems  Constitutional: Negative for unusual diaphoresis or night sweats HENT: Negative for ear swelling or discharge Eyes: Negative for worsening visual haziness  Respiratory: Negative for choking and stridor.   Gastrointestinal: Negative for distension or worsening eructation Genitourinary: Negative for retention or change in urine volume.  Musculoskeletal: Negative for other MSK pain or swelling Skin: Negative for color change and worsening wound Neurological: Negative for tremors and numbness other than noted  Psychiatric/Behavioral:  Negative for decreased concentration or agitation other than above   All other system neg    Objective:   Physical Exam BP 138/78   Pulse 86   Temp 98.6 F (37 C) (Oral)   Resp 20   Wt 131 lb (59.4 kg)   SpO2 98%   BMI 26.46 kg/m  VS noted, mild ill Constitutional: Pt appears in no apparent distress HENT: Head: NCAT.  Right Ear: External ear normal.  Left Ear: External ear normal.  Eyes: . Pupils are equal, round, and reactive to light. Conjunctivae and EOM are normal Bilat tm's with mild erythema.  Max sinus areas mild tender.  Pharynx with mild erythema, no exudate Neck: Normal range of motion. Neck supple.  Cardiovascular: Normal rate and regular rhythm.   Pulmonary/Chest: Effort normal and breath sounds without rales or wheezing.  Abd:  Soft, ND, + BS with mild low abd tender, no guarding or rebound Neurological: Pt is alert. Not confused , motor grossly intact Skin: Skin is warm. No rash, no LE edema Psychiatric: Pt behavior is normal. No agitation.  No other new exam findings    Assessment & Plan:

## 2016-10-05 NOTE — Assessment & Plan Note (Signed)
pt unable to give specimen at this time, Mild to mod, for empiric antibx course as above,  to f/u any worsening symptoms or concerns

## 2016-10-05 NOTE — Assessment & Plan Note (Signed)
stable overall by history and exam, recent data reviewed with pt, and pt to continue medical treatment as before,  to f/u any worsening symptoms or concerns BP Readings from Last 3 Encounters:  10/05/16 138/78  02/10/16 110/60  09/10/15 118/72

## 2016-10-08 ENCOUNTER — Encounter: Payer: Medicare Other | Admitting: Internal Medicine

## 2016-10-27 ENCOUNTER — Other Ambulatory Visit: Payer: Self-pay | Admitting: Internal Medicine

## 2016-11-02 ENCOUNTER — Encounter: Payer: Medicare Other | Admitting: Internal Medicine

## 2016-11-14 ENCOUNTER — Other Ambulatory Visit: Payer: Self-pay | Admitting: Internal Medicine

## 2016-11-24 ENCOUNTER — Other Ambulatory Visit: Payer: Self-pay | Admitting: Internal Medicine

## 2016-11-26 ENCOUNTER — Other Ambulatory Visit (HOSPITAL_COMMUNITY): Payer: Self-pay | Admitting: Psychiatry

## 2016-11-26 ENCOUNTER — Other Ambulatory Visit (INDEPENDENT_AMBULATORY_CARE_PROVIDER_SITE_OTHER): Payer: Medicare Other

## 2016-11-26 ENCOUNTER — Encounter: Payer: Self-pay | Admitting: Internal Medicine

## 2016-11-26 ENCOUNTER — Ambulatory Visit (INDEPENDENT_AMBULATORY_CARE_PROVIDER_SITE_OTHER): Payer: Medicare Other | Admitting: Internal Medicine

## 2016-11-26 VITALS — BP 134/78 | HR 71 | Temp 98.1°F | Ht 59.0 in | Wt 132.0 lb

## 2016-11-26 DIAGNOSIS — R7989 Other specified abnormal findings of blood chemistry: Secondary | ICD-10-CM

## 2016-11-26 DIAGNOSIS — Z0000 Encounter for general adult medical examination without abnormal findings: Secondary | ICD-10-CM | POA: Diagnosis not present

## 2016-11-26 DIAGNOSIS — I6529 Occlusion and stenosis of unspecified carotid artery: Secondary | ICD-10-CM

## 2016-11-26 DIAGNOSIS — I10 Essential (primary) hypertension: Secondary | ICD-10-CM | POA: Diagnosis not present

## 2016-11-26 LAB — BASIC METABOLIC PANEL
BUN: 15 mg/dL (ref 6–23)
CALCIUM: 9 mg/dL (ref 8.4–10.5)
CHLORIDE: 101 meq/L (ref 96–112)
CO2: 29 meq/L (ref 19–32)
Creatinine, Ser: 0.66 mg/dL (ref 0.40–1.20)
GFR: 93.82 mL/min (ref >60.00)
Glucose, Bld: 86 mg/dL (ref 70–99)
Potassium: 4 mEq/L (ref 3.5–5.1)
SODIUM: 136 meq/L (ref 135–145)

## 2016-11-26 LAB — CBC WITH DIFFERENTIAL/PLATELET
BASOS PCT: 0.6 % (ref 0.0–3.0)
Basophils Absolute: 0 10*3/uL (ref 0.0–0.1)
Eosinophils Absolute: 0.3 10*3/uL (ref 0.0–0.7)
Eosinophils Relative: 3.7 % (ref 0.0–5.0)
HEMATOCRIT: 37.1 % (ref 36.0–46.0)
Hemoglobin: 12.6 g/dL (ref 12.0–15.0)
LYMPHS PCT: 17.2 % (ref 12.0–46.0)
Lymphs Abs: 1.4 10*3/uL (ref 0.7–4.0)
MCHC: 34.1 g/dL (ref 30.0–36.0)
MCV: 98.8 fl (ref 78.0–100.0)
MONOS PCT: 12.1 % — AB (ref 3.0–12.0)
Monocytes Absolute: 1 10*3/uL (ref 0.1–1.0)
NEUTROS ABS: 5.3 10*3/uL (ref 1.4–7.7)
Neutrophils Relative %: 66.4 % (ref 43.0–77.0)
Platelets: 343 10*3/uL (ref 150.0–400.0)
RBC: 3.75 Mil/uL — ABNORMAL LOW (ref 3.87–5.11)
RDW: 13.2 % (ref 11.5–15.5)
WBC: 8 10*3/uL (ref 4.0–10.5)

## 2016-11-26 LAB — URINALYSIS, ROUTINE W REFLEX MICROSCOPIC
Bilirubin Urine: NEGATIVE
Hgb urine dipstick: NEGATIVE
KETONES UR: NEGATIVE
Leukocytes, UA: NEGATIVE
Nitrite: NEGATIVE
RBC / HPF: NONE SEEN (ref 0–?)
Specific Gravity, Urine: 1.015 (ref 1.000–1.030)
Total Protein, Urine: NEGATIVE
Urine Glucose: NEGATIVE
Urobilinogen, UA: 0.2 (ref 0.0–1.0)
WBC UA: NONE SEEN (ref 0–?)
pH: 6.5 (ref 5.0–8.0)

## 2016-11-26 LAB — LIPID PANEL
CHOL/HDL RATIO: 4
Cholesterol: 162 mg/dL (ref 0–200)
HDL: 36.1 mg/dL — ABNORMAL LOW (ref >39.00)
NONHDL: 125.93
Triglycerides: 294 mg/dL — ABNORMAL HIGH (ref 0.0–149.0)
VLDL: 58.8 mg/dL — ABNORMAL HIGH (ref 0.0–40.0)

## 2016-11-26 LAB — HEPATIC FUNCTION PANEL
ALK PHOS: 65 U/L (ref 39–117)
ALT: 13 U/L (ref 0–35)
AST: 14 U/L (ref 0–37)
Albumin: 3.8 g/dL (ref 3.5–5.2)
BILIRUBIN DIRECT: 0.1 mg/dL (ref 0.0–0.3)
BILIRUBIN TOTAL: 0.2 mg/dL (ref 0.2–1.2)
TOTAL PROTEIN: 6.3 g/dL (ref 6.0–8.3)

## 2016-11-26 LAB — TSH: TSH: 0.95 u[IU]/mL (ref 0.35–4.50)

## 2016-11-26 LAB — LDL CHOLESTEROL, DIRECT: LDL DIRECT: 98 mg/dL

## 2016-11-26 MED ORDER — METOPROLOL SUCCINATE ER 50 MG PO TB24: ORAL_TABLET | ORAL | 3 refills | Status: DC

## 2016-11-26 MED ORDER — LEVOTHYROXINE SODIUM 75 MCG PO TABS: 75.0000 ug | ORAL_TABLET | Freq: Every day | ORAL | 3 refills | Status: DC

## 2016-11-26 MED ORDER — MONTELUKAST SODIUM 10 MG PO TABS: 10.0000 mg | ORAL_TABLET | Freq: Every day | ORAL | 3 refills | Status: DC

## 2016-11-26 MED ORDER — SOLIFENACIN SUCCINATE 5 MG PO TABS: 5.0000 mg | ORAL_TABLET | Freq: Every day | ORAL | 3 refills | Status: DC

## 2016-11-26 MED ORDER — PANTOPRAZOLE SODIUM 40 MG PO TBEC: 40.0000 mg | DELAYED_RELEASE_TABLET | Freq: Every day | ORAL | 3 refills | Status: DC

## 2016-11-26 NOTE — Progress Notes (Signed)
Subjective:    Patient ID: Sue Green, female    DOB: 09-22-1945, 71 y.o.   MRN: [id number]  HPI  Here for wellness and f/u;  Overall doing ok;  Pt denies Chest pain, worsening SOB, DOE, wheezing, orthopnea, PND, worsening LE edema, palpitations, dizziness or syncope.  Pt denies neurological change such as new headache, facial or extremity weakness.  Pt denies polydipsia, polyuria, or low sugar symptoms. Pt states overall good compliance with treatment and medications, good tolerability, and has been trying to follow appropriate diet.  Pt denies worsening depressive symptoms, suicidal ideation or panic. No fever, night sweats, wt loss, loss of appetite, or other constitutional symptoms.  Pt states good ability with ADL's, has low fall risk, home safety reviewed and adequate, no other significant changes in hearing or vision, and only occasionally active with exercise. No new complaints or new hx.  Did have recent lifeline screening which showed abnormal carotids screen, pt asks for f/u testing. Past Medical History:  Diagnosis Date  . Allergy   . Anemia, iron deficiency 03/06/2014  . Anxiety   . Arthritis   . Asthma   . Chronic tension headaches    IN PAST  . Depression   . Fibromyalgia   . GERD (gastroesophageal reflux disease)   . Glaucoma     Per pt, she does not have glaucoma.  . Hypertension   . Internal hemorrhoids   . Iron deficiency anemia   . Osteopenia   . Pyloric stenosis   . Scoliosis   . Thyroid disease    hypothyroidism   Past Surgical History:  Procedure Laterality Date  . APPENDECTOMY  1975  . SHOULDER ARTHROSCOPY  2001   rt shoulder  . TONSILLECTOMY AND ADENOIDECTOMY     71 years old    reports that she has never smoked. She has never used smokeless tobacco. She reports that she does not drink alcohol or use drugs. family history includes Arthritis in her mother; Cancer in her father; Diabetes in her father and mother; Pancreatic cancer in her  father. Allergies  Allergen Reactions  . Penicillins Hives  . Doxycycline Nausea Only  . Antihistamines, Diphenhydramine-Type   . Diphenhydramine   . Latex     BREAK OUT IN RASH   Current Outpatient Prescriptions on File Prior to Visit  Medication Sig Dispense Refill  . Ascorbic Acid (VITAMIN C) 500 MG tablet Take 500 mg by mouth daily.      . Calcium-Vitamin D-Vitamin K (CALCIUM + D) [phone] MG-UNT-MCG CHEW Chew 1 tablet by mouth 2 (two) times daily.    Marland Kitchen desvenlafaxine (PRISTIQ) 100 MG 24 hr tablet Take 100 mg by mouth daily.      . ferrous sulfate 325 (65 FE) MG tablet Take 325 mg by mouth daily with breakfast.    . glucosamine-chondroitin 500-400 MG tablet Take 1 tablet by mouth 2 (two) times daily.     Javier Docker Oil 300 MG CAPS Take by mouth daily.    Marland Kitchen LORazepam (ATIVAN) 1 MG tablet Take 1 mg by mouth 2 (two) times daily as needed.      . methylcellulose oral powder Take 1 packet by mouth daily.    . Multiple Vitamins-Minerals (ONE-A-DAY WOMENS 50+ ADVANTAGE) TABS Take 1 tablet by mouth daily.    Marland Kitchen OVER THE COUNTER MEDICATION 2 (two) times daily.     . polyethylene glycol powder (GLYCOLAX/MIRALAX) powder Take 17 g by mouth daily. Overdue for yearly physical w/labs must see MD  for refills 527 g 0   No current facility-administered medications on file prior to visit.    Review of Systems Constitutional: Negative for increased diaphoresis, or other activity, appetite or siginficant weight change other than noted HENT: Negative for worsening hearing loss, ear pain, facial swelling, mouth sores and neck stiffness.   Eyes: Negative for other worsening pain, redness or visual disturbance.  Respiratory: Negative for choking or stridor Cardiovascular: Negative for other chest pain and palpitations.  Gastrointestinal: Negative for worsening diarrhea, blood in stool, or abdominal distention Genitourinary: Negative for hematuria, flank pain or change in urine volume.  Musculoskeletal:  Negative for myalgias or other joint complaints.  Skin: Negative for other color change and wound or drainage.  Neurological: Negative for syncope and numbness. other than noted Hematological: Negative for adenopathy. or other swelling Psychiatric/Behavioral: Negative for hallucinations, SI, self-injury, decreased concentration or other worsening agitation.  All other system neg per pt    Objective:   Physical Exam BP 134/78   Pulse 71   Temp 98.1 F (36.7 C)   Ht 4\' 11"  (1.499 m)   Wt 132 lb (59.9 kg)   SpO2 98%   BMI 26.66 kg/m  VS noted,  Constitutional: Pt is oriented to person, place, and time. Appears well-developed and well-nourished, in no significant distress Head: Normocephalic and atraumatic  Eyes: Conjunctivae and EOM are normal. Pupils are equal, round, and reactive to light Right Ear: External ear normal.  Left Ear: External ear normal Nose: Nose normal.  Mouth/Throat: Oropharynx is clear and moist  Neck: Normal range of motion. Neck supple. No JVD present. No tracheal deviation present or significant neck LA or mass Cardiovascular: Normal rate, regular rhythm, normal heart sounds and intact distal pulses.   Pulmonary/Chest: Effort normal and breath sounds without rales or wheezing  Abdominal: Soft. Bowel sounds are normal. NT. No HSM  Musculoskeletal: Normal range of motion. Exhibits no edema Lymphadenopathy: Has no cervical adenopathy.  Neurological: Pt is alert and oriented to person, place, and time. Pt has normal reflexes. No cranial nerve deficit. Motor grossly intact Skin: Skin is warm and dry. No rash noted or new ulcers Psychiatric:  Has normal mood and affect. Behavior is normal.  No other new exam findings  Lab Results  Component Value Date   WBC 8.0 11/26/2016   HGB 12.6 11/26/2016   HCT 37.1 11/26/2016   PLT 343.0 11/26/2016   GLUCOSE 86 11/26/2016   CHOL 162 11/26/2016   TRIG 294.0 (H) 11/26/2016   HDL 36.10 (L) 11/26/2016   LDLDIRECT 98.0  11/26/2016   LDLCALC 119 (H) 07/31/2013   ALT 13 11/26/2016   AST 14 11/26/2016   NA 136 11/26/2016   K 4.0 11/26/2016   CL 101 11/26/2016   CREATININE 0.66 11/26/2016   BUN 15 11/26/2016   CO2 29 11/26/2016   TSH 0.95 11/26/2016       Assessment & Plan:

## 2016-11-26 NOTE — Patient Instructions (Signed)
Please continue all other medications as before, and refills have been done if requested.  Please have the pharmacy call with any other refills you may need.  Please continue your efforts at being more active, low cholesterol diet, and weight control.  You are otherwise up to date with prevention measures today.  Please keep your appointments with your specialists as you may have planned  You will be contacted regarding the referral for: carotid doppler testing  Please go to the LAB in the Basement (turn left off the elevator) for the tests to be done today  You will be contacted by phone if any changes need to be made immediately.  Otherwise, you will receive a letter about your results with an explanation, but please check with MyChart first.  Please remember to sign up for MyChart if you have not done so, as this will be important to you in the future with finding out test results, communicating by private email, and scheduling acute appointments online when needed.  Please return in 1 year for your yearly visit, or sooner if needed, with Lab testing done 3-5 days before

## 2016-11-27 NOTE — Assessment & Plan Note (Signed)

## 2016-11-27 NOTE — Assessment & Plan Note (Addendum)
Ok for f/u  Carotid dopplers, consider statin for ldl > 70 given prob PVD

## 2016-11-27 NOTE — Assessment & Plan Note (Signed)
stable overall by history and exam, recent data reviewed with pt, and pt to continue medical treatment as before,  to f/u any worsening symptoms or concerns BP Readings from Last 3 Encounters:  11/26/16 134/78  10/05/16 138/78  02/10/16 110/60

## 2016-11-30 ENCOUNTER — Other Ambulatory Visit (HOSPITAL_COMMUNITY): Payer: Self-pay | Admitting: Psychiatry

## 2016-12-10 ENCOUNTER — Other Ambulatory Visit: Payer: Self-pay | Admitting: Internal Medicine

## 2016-12-15 ENCOUNTER — Other Ambulatory Visit: Payer: Self-pay | Admitting: Internal Medicine

## 2016-12-15 DIAGNOSIS — R42 Dizziness and giddiness: Secondary | ICD-10-CM

## 2016-12-21 ENCOUNTER — Ambulatory Visit (HOSPITAL_COMMUNITY)
Admission: RE | Admit: 2016-12-21 | Discharge: 2016-12-21 | Disposition: A | Discharge status: Home / Self Care | Payer: Medicare Other | Source: Ambulatory Visit | Attending: Cardiovascular Disease | Admitting: Cardiovascular Disease

## 2016-12-21 DIAGNOSIS — I6523 Occlusion and stenosis of bilateral carotid arteries: Secondary | ICD-10-CM | POA: Diagnosis not present

## 2016-12-21 DIAGNOSIS — I10 Essential (primary) hypertension: Secondary | ICD-10-CM | POA: Diagnosis not present

## 2016-12-21 DIAGNOSIS — R42 Dizziness and giddiness: Secondary | ICD-10-CM

## 2016-12-22 ENCOUNTER — Encounter: Payer: Self-pay | Admitting: Internal Medicine

## 2016-12-22 ENCOUNTER — Telehealth: Payer: Self-pay | Admitting: Internal Medicine

## 2016-12-22 NOTE — Telephone Encounter (Signed)
She will have to tell the pharmacy to request a quantity of 30 when she is due for a refill.

## 2016-12-22 NOTE — Telephone Encounter (Signed)
In future pt wants Protonix as a qt of 30.

## 2016-12-22 NOTE — Telephone Encounter (Signed)
Pt.notified

## 2017-01-13 ENCOUNTER — Other Ambulatory Visit: Payer: Self-pay | Admitting: Internal Medicine

## 2017-01-14 NOTE — Telephone Encounter (Signed)
Done erx 

## 2017-01-24 ENCOUNTER — Other Ambulatory Visit: Payer: Self-pay | Admitting: Internal Medicine

## 2017-03-25 ENCOUNTER — Ambulatory Visit (INDEPENDENT_AMBULATORY_CARE_PROVIDER_SITE_OTHER): Payer: Medicare Other

## 2017-03-25 DIAGNOSIS — Z23 Encounter for immunization: Secondary | ICD-10-CM

## 2017-03-31 ENCOUNTER — Ambulatory Visit: Payer: Medicare Other | Admitting: Nurse Practitioner

## 2017-03-31 ENCOUNTER — Telehealth: Payer: Self-pay | Admitting: Gastroenterology

## 2017-03-31 NOTE — Telephone Encounter (Signed)
Schedule in the computer is not accurate, Nevin Bloodgood is not here today.  I contacted the patient and rescheduled her to tomorrow at 2:45 with Ellouise Newer, PA

## 2017-03-31 NOTE — Telephone Encounter (Signed)
Patient notified that she should hold the Mialax as long as she is having loose stools or diarrhea.  She will keep the appt for tomorrow.

## 2017-03-31 NOTE — Telephone Encounter (Signed)
Patient with loose stool and abdominal pain since Tuesday.  She was incontinent of stool as well.  She will come in today and see .paula at 1:30

## 2017-04-01 ENCOUNTER — Encounter (INDEPENDENT_AMBULATORY_CARE_PROVIDER_SITE_OTHER): Payer: Self-pay

## 2017-04-01 ENCOUNTER — Ambulatory Visit (INDEPENDENT_AMBULATORY_CARE_PROVIDER_SITE_OTHER): Payer: Medicare Other | Admitting: Physician Assistant

## 2017-04-01 ENCOUNTER — Encounter: Payer: Self-pay | Admitting: Physician Assistant

## 2017-04-01 VITALS — BP 132/66 | HR 76 | Ht 59.0 in | Wt 129.8 lb

## 2017-04-01 DIAGNOSIS — R1084 Generalized abdominal pain: Secondary | ICD-10-CM

## 2017-04-01 DIAGNOSIS — R197 Diarrhea, unspecified: Secondary | ICD-10-CM

## 2017-04-01 MED ORDER — HYOSCYAMINE SULFATE 0.125 MG SL SUBL: SUBLINGUAL_TABLET | SUBLINGUAL | 1 refills | Status: DC

## 2017-04-01 NOTE — Progress Notes (Signed)
Chief Complaint: Abdominal pain, diarrhea  HPI:  Sue Green is a 71 year old Caucasian female with history of listed below, who regularly follows with Dr. Fuller Plan and presents today with complaint of an acute onset of abdominal pain and diarrhea.   Today, the patient describes that on Monday she went to a local [employer] and got a [place] sub. She ate half of this that day and felt fine. The next day she woke up and later that day had the "rest of this sub which seemed more full of mayo", and ate what she could. She then had a Popsicle and went to bed after drinking a small amount of diet Dr. Malachi Bonds. Patient tells me that she awoke from her sleep with nausea and had an episode of vomiting. She continued to vomit multiple times and was able to go back to sleep. She then awoke on Wednesday and had a bowel movement which was solid at first but then watery and "filled the tank". This diarrhea continued on Thursday. She only ate breakfast that day. She had no further nausea or vomiting but has had 2-3 liquid stools since that time, the last one yesterday after eating a popsicle for dinner. She tells me that it is "watery". She has been hungry but somewhat afraid to eat. Patient has held her MiraLAX yesterday and today due to this loose stool. Patient has not eaten anything today and has had no further diarrhea or abdominal pain.   Patient denies fever, chills, blood in her stool, melena, weight loss, fatigue, heartburn or reflux.  Past Medical History:  Diagnosis Date  . Allergy   . Anemia, iron deficiency 03/06/2014  . Anxiety   . Arthritis   . Asthma   . Chronic tension headaches    IN PAST  . Depression   . Fibromyalgia   . GERD (gastroesophageal reflux disease)   . Glaucoma     Per pt, she does not have glaucoma.  . Hypertension   . Internal hemorrhoids   . Iron deficiency anemia   . Osteopenia   . Pyloric stenosis   . Scoliosis   . Thyroid disease    hypothyroidism    Past  Surgical History:  Procedure Laterality Date  . APPENDECTOMY  1975  . SHOULDER ARTHROSCOPY  2001   rt shoulder  . TONSILLECTOMY AND ADENOIDECTOMY     71 years old    Current Outpatient Prescriptions  Medication Sig Dispense Refill  . AMBULATORY NON FORMULARY MEDICATION Medication Name: Instaflex--- Take 1 tablet by mouth 2 hours after breakfast    . AMBULATORY NON FORMULARY MEDICATION Medication Name: Instaflex Bone Support-Take 3 tablets by mouth 2 hours after breakfast    . Ascorbic Acid (VITAMIN C) 500 MG tablet Take 500 mg by mouth daily.      . Calcium-Vitamin D-Vitamin K (CALCIUM + D) [phone] MG-UNT-MCG CHEW Chew 1 tablet by mouth 2 (two) times daily.    . cyclobenzaprine (FLEXERIL) 10 MG tablet TAKE 1 TABLET BY MOUTH ONCE DAILY 30 tablet 5  . desvenlafaxine (PRISTIQ) 100 MG 24 hr tablet Take 100 mg by mouth daily.      . ferrous sulfate 325 (65 FE) MG tablet Take 325 mg by mouth daily with breakfast.    . glucosamine-chondroitin 500-400 MG tablet Take 1 tablet by mouth 2 (two) times daily.     Javier Docker Oil 300 MG CAPS Take by mouth daily.    Marland Kitchen levothyroxine (SYNTHROID) 75 MCG tablet Take 1 tablet (75  mcg total) by mouth daily. --needs appt before any further refills 90 tablet 3  . LORazepam (ATIVAN) 1 MG tablet Take 1 mg by mouth 2 (two) times daily as needed.      . Methylcellulose, Laxative, (CITRUCEL PO) Take 1 tablespoon before breakfast in water    . metoprolol succinate (TOPROL-XL) 50 MG 24 hr tablet TAKE ONE AND 1/2 TABS BY MOUTH IN THE AM DAILY 135 tablet 3  . montelukast (SINGULAIR) 10 MG tablet Take 1 tablet (10 mg total) by mouth daily. 90 tablet 3  . Multiple Vitamins-Minerals (ONE-A-DAY WOMENS 50+ ADVANTAGE) TABS Take 1 tablet by mouth daily.    Marland Kitchen OVER THE COUNTER MEDICATION 2 (two) times daily.     . pantoprazole (PROTONIX) 40 MG tablet Take 1 tablet (40 mg total) by mouth daily. Must keep f/u appt for future refills 90 tablet 3  . Probiotic Product (PROBIOTIC-10  PO) Take by mouth.    . polyethylene glycol powder (GLYCOLAX/MIRALAX) powder Take 17 g by mouth daily. (Patient not taking: Reported on 04/01/2017) 527 g 11   No current facility-administered medications for this visit.     Allergies as of 04/01/2017 - Review Complete 04/01/2017  Allergen Reaction Noted  . Penicillins Hives 12/29/2010  . Doxycycline Nausea Only 12/29/2010  . Antihistamines, diphenhydramine-type  02/10/2016  . Diphenhydramine  08/18/2011  . Latex  09/04/2013    Family History  Problem Relation Age of Onset  . Diabetes Mother   . Arthritis Mother   . Cancer Father   . Diabetes Father   . Colon cancer Neg Hx     Social History   Social History  . Marital status: Divorced    Spouse name: N/A  . Number of children: N/A  . Years of education: 6   Occupational History  . Retired    Social History Main Topics  . Smoking status: Never Smoker  . Smokeless tobacco: Never Used  . 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 Drug use: No  . Sexual activity: Not on file   Other Topics Concern  . Not on file   Social History Narrative   Regular exercise-no   Caffeine Use-unsure    Review of Systems:    Constitutional: No weight loss, fever or chills Skin: No rash Cardiovascular: No chest pain Respiratory: No SOB  Gastrointestinal: See HPI and otherwise negative   Physical Exam:  Vital signs: BP 132/66   Pulse 76   Ht 4\' 11"  (1.499 m)   Wt 129 lb 12.8 oz (58.9 kg)   BMI 26.22 kg/m   Constitutional:   Pleasant Caucasian female appears to be in NAD, Well developed, Well nourished, alert and cooperative Respiratory: Respirations even and unlabored. Lungs clear to auscultation bilaterally.   No wheezes, crackles, or rhonchi.  Cardiovascular: Normal S1, S2. No MRG. Regular rate and rhythm. No peripheral edema, cyanosis or pallor.  Gastrointestinal:  Soft, nondistended, nontender. No rebound or guarding. Normal bowel sounds. No appreciable masses or  hepatomegaly. Rectal:  Not performed.  Msk:  Symmetrical without gross deformities. Without edema, no deformity or joint abnormality.  Neurologic:  Alert and  oriented x4;  grossly normal neurologically.  Skin:   Dry and intact without significant lesions or rashes. Psychiatric: Demonstrates good judgement and reason without abnormal affect or behaviors.  MOST RECENT LABS AND IMAGING: CBC    Component Value Date/Time   WBC 8.0 11/26/2016 1504   RBC 3.75 (L) 11/26/2016 1504  HGB 12.6 11/26/2016 1504   HCT 37.1 11/26/2016 1504   PLT 343.0 11/26/2016 1504   MCV 98.8 11/26/2016 1504   MCH 32.9 07/17/2012 0950   MCHC 34.1 11/26/2016 1504   RDW 13.2 11/26/2016 1504   LYMPHSABS 1.4 11/26/2016 1504   MONOABS 1.0 11/26/2016 1504   EOSABS 0.3 11/26/2016 1504   BASOSABS 0.0 11/26/2016 1504    CMP     Component Value Date/Time   NA 136 11/26/2016 1504   K 4.0 11/26/2016 1504   CL 101 11/26/2016 1504   CO2 29 11/26/2016 1504   GLUCOSE 86 11/26/2016 1504   BUN 15 11/26/2016 1504   CREATININE 0.66 11/26/2016 1504   CREATININE 0.70 07/17/2012 0950   CALCIUM 9.0 11/26/2016 1504   PROT 6.3 11/26/2016 1504   ALBUMIN 3.8 11/26/2016 1504   AST 14 11/26/2016 1504   ALT 13 11/26/2016 1504   ALKPHOS 65 11/26/2016 1504   BILITOT 0.2 11/26/2016 1504    Assessment: 1. Abdominal pain: Patient reports cramping abdominal pain initially even without a bowel movement, now just before a bowel movement typically relieved afterwards, sometimes lingering 2. Diarrhea: Acute onset after above, started after eating a sandwich, consider relation to virus versus other  Plan: 1. Discussed with the patient that her symptoms sound most consistent with viral cause as they are getting somewhat better. It is also very encouraging that she only has diarrhea and abdominal pain if she eats something. This is better over the past 2 days. 2. Ordered stool studies include a GI pathogen panel, O&P, lactoferrin and  fecal pancreatic elastase. She is only to return her stool samples if it remains liquid. 3. Prescribed Hyoscyamine sulfate 0.125 mg for the patient to take 30 minutes before eating 3 times a day 4. Recommend that she continue to hold her MiraLAX for now 5. Recommend she maintain a BRAT/or bland diet over the next 3-5 days 6. Patient to follow in clinic with me or Dr. Fuller Plan in the next 2-3 weeks  Ellouise Newer, PA-C [facility] 04/01/2017, 2:48 PM  Cc: Biagio Borg, MD

## 2017-04-01 NOTE — Progress Notes (Signed)
Reviewed and agree with initial management plan.  Teja Costen T. Roma Bondar, MD FACG 

## 2017-04-01 NOTE — Patient Instructions (Addendum)
      Bland Diet A bland diet consists of foods that do not have a lot of fat or fiber. Foods without fat or fiber are easier for the body to digest. They are also less likely to irritate your mouth, throat, stomach, and other parts of your gastrointestinal tract. A bland diet is sometimes called a BRAT diet. What is my plan? Your health care provider or dietitian may recommend specific changes to your diet to prevent and treat your symptoms, such as:  Eating small meals often.  Cooking food until it is soft enough to chew easily.  Chewing your food well.  Drinking fluids slowly.  Not eating foods that are very spicy, sour, or fatty.  Not eating citrus fruits, such as oranges and grapefruit.  What do I need to know about this diet?  Eat a variety of foods from the bland diet food list.  Do not follow a bland diet longer than you have to.  Ask your health care provider whether you should take vitamins. What foods can I eat? Grains  Hot cereals, such as cream of wheat. Bread, crackers, or tortillas made from refined white flour. Rice. Vegetables Canned or cooked vegetables. Mashed or boiled potatoes. Fruits Bananas. Applesauce. Other types of cooked or canned fruit with the skin and seeds removed, such as canned peaches or pears. Meats and Other Protein Sources Scrambled eggs. Creamy peanut butter or other nut butters. Lean, well-cooked meats, such as chicken or fish. Tofu. Soups or broths. Dairy Low-fat dairy products, such as milk, cottage cheese, or yogurt. Beverages Water. Herbal tea. Apple juice. Sweets and Desserts Pudding. Custard. Fruit gelatin. Ice cream. Fats and Oils Mild salad dressings. Canola or olive oil. The items listed above may not be a complete list of allowed foods or beverages. Contact your dietitian for more options. What foods are not recommended? Foods and ingredients that are often not recommended include:  Spicy foods, such as hot sauce or  salsa.  Fried foods.  Sour foods, such as pickled or fermented foods.  Raw vegetables or fruits, especially citrus or berries.  Caffeinated drinks.  Alcohol.  Strongly flavored seasonings or condiments.  The items listed above may not be a complete list of foods and beverages that are not allowed. Contact your dietitian for more information. This information is not intended to replace advice given to you by your health care provider. Make sure you discuss any questions you have with your health care provider. Document Released: 12/29/2015 Document Revised: 02/12/2016 Document Reviewed: 09/18/2014 Elsevier Interactive Patient Education  2018 [employer].  Please go to the basement level to have your labs drawn.   We have sent the following medications to your pharmacy for you to pick up at your convenience: [facility] [address], [facility].  1. Hyoscyamine Sulfate 0.125 mg

## 2017-04-04 ENCOUNTER — Telehealth: Payer: Self-pay | Admitting: Physician Assistant

## 2017-04-04 NOTE — Telephone Encounter (Signed)
Left message on machine to call back  

## 2017-04-04 NOTE — Telephone Encounter (Signed)
Patient reports that she is no longer having diarrhea. She is advised that according to Ellouise Newer, PA's note she was to collect stool studies only if she was still having diarrhea. She is advised that she should only take Levsin if she was having cramping and pain.  She has a sore in her mouth and the levsin aggravated it. She will call back for any additional questions or concerns.

## 2017-04-13 ENCOUNTER — Telehealth: Payer: Self-pay | Admitting: Internal Medicine

## 2017-04-13 NOTE — Telephone Encounter (Signed)
Pt has an appt for tomorrow for a cold but does not think she can come in that late, she would like to know what she can take [facility] for her cold She did not give a good explanation of her symtoms, just that she is sniffling and her voice is lower pitched.  Please call her mobile

## 2017-04-13 NOTE — Telephone Encounter (Signed)
Pt has been informed and expressed understanding.  

## 2017-04-13 NOTE — Telephone Encounter (Signed)
Sure, delsym for cough and mucinex for congestion would likely help, thanks

## 2017-04-14 ENCOUNTER — Ambulatory Visit: Payer: Medicare Other | Admitting: Internal Medicine

## 2017-04-28 NOTE — Telephone Encounter (Signed)
Patient got a no show letter for this appointment. She would like to know why, when she had called and got advised over the phone what to do. Please follow up with patient. Thank you.

## 2017-04-29 NOTE — Telephone Encounter (Signed)
Changed status on appointment and left vm to inform patient

## 2017-07-15 ENCOUNTER — Other Ambulatory Visit: Payer: Self-pay | Admitting: Internal Medicine

## 2017-08-24 LAB — HM MAMMOGRAPHY

## 2017-08-31 ENCOUNTER — Other Ambulatory Visit: Payer: Self-pay

## 2017-08-31 MED ORDER — METOPROLOL SUCCINATE ER 50 MG PO TB24: ORAL_TABLET | ORAL | 3 refills | Status: DC

## 2017-08-31 MED ORDER — MONTELUKAST SODIUM 10 MG PO TABS: 10.0000 mg | ORAL_TABLET | Freq: Every day | ORAL | 3 refills | Status: DC

## 2017-11-30 ENCOUNTER — Encounter: Payer: Self-pay | Admitting: Internal Medicine

## 2017-11-30 ENCOUNTER — Other Ambulatory Visit (INDEPENDENT_AMBULATORY_CARE_PROVIDER_SITE_OTHER): Payer: Medicare Other

## 2017-11-30 ENCOUNTER — Ambulatory Visit (INDEPENDENT_AMBULATORY_CARE_PROVIDER_SITE_OTHER): Payer: Medicare Other | Admitting: Internal Medicine

## 2017-11-30 VITALS — BP 138/92 | HR 85 | Temp 98.2°F | Ht 59.0 in | Wt 139.0 lb

## 2017-11-30 DIAGNOSIS — Z0000 Encounter for general adult medical examination without abnormal findings: Secondary | ICD-10-CM

## 2017-11-30 DIAGNOSIS — I10 Essential (primary) hypertension: Secondary | ICD-10-CM | POA: Diagnosis not present

## 2017-11-30 LAB — CBC WITH DIFFERENTIAL/PLATELET
BASOS ABS: 0.1 10*3/uL (ref 0.0–0.1)
Basophils Relative: 0.9 % (ref 0.0–3.0)
EOS ABS: 0.3 10*3/uL (ref 0.0–0.7)
Eosinophils Relative: 3.9 % (ref 0.0–5.0)
HEMATOCRIT: 35.7 % — AB (ref 36.0–46.0)
Hemoglobin: 12.3 g/dL (ref 12.0–15.0)
LYMPHS PCT: 15.3 % (ref 12.0–46.0)
Lymphs Abs: 1.2 10*3/uL (ref 0.7–4.0)
MCHC: 34.5 g/dL (ref 30.0–36.0)
MCV: 96 fl (ref 78.0–100.0)
Monocytes Absolute: 0.9 10*3/uL (ref 0.1–1.0)
Monocytes Relative: 12.2 % — ABNORMAL HIGH (ref 3.0–12.0)
NEUTROS ABS: 5.1 10*3/uL (ref 1.4–7.7)
Neutrophils Relative %: 67.7 % (ref 43.0–77.0)
Platelets: 367 10*3/uL (ref 150.0–400.0)
RBC: 3.72 Mil/uL — ABNORMAL LOW (ref 3.87–5.11)
RDW: 12.9 % (ref 11.5–15.5)
WBC: 7.6 10*3/uL (ref 4.0–10.5)

## 2017-11-30 LAB — TSH: TSH: 1.34 u[IU]/mL (ref 0.35–4.50)

## 2017-11-30 LAB — URINALYSIS, ROUTINE W REFLEX MICROSCOPIC
Bilirubin Urine: NEGATIVE
HGB URINE DIPSTICK: NEGATIVE
KETONES UR: NEGATIVE
Leukocytes, UA: NEGATIVE
NITRITE: NEGATIVE
RBC / HPF: NONE SEEN (ref 0–?)
Specific Gravity, Urine: <=1.005 — AB (ref 1.000–1.030)
Total Protein, Urine: NEGATIVE
URINE GLUCOSE: NEGATIVE
UROBILINOGEN UA: 0.2 (ref 0.0–1.0)
WBC UA: NONE SEEN (ref 0–?)
pH: 6.5 (ref 5.0–8.0)

## 2017-11-30 NOTE — Patient Instructions (Signed)

## 2017-11-30 NOTE — Progress Notes (Signed)
Subjective:    Patient ID: Sue Green, female    DOB: Jun 11, 1946, 72 y.o.   MRN: [id number]  HPI  Here for wellness and f/u;  Overall doing ok;  Pt denies Chest pain, worsening SOB, DOE, wheezing, orthopnea, PND, worsening LE edema, palpitations, dizziness or syncope.  Pt denies neurological change such as new headache, facial or extremity weakness.  Pt denies polydipsia, polyuria, or low sugar symptoms. Pt states overall good compliance with treatment and medications, good tolerability, and has been trying to follow appropriate diet.  Pt denies worsening depressive symptoms, suicidal ideation or panic. No fever, night sweats, wt loss, loss of appetite, or other constitutional symptoms.  Pt states good ability with ADL's, has low fall risk, home safety reviewed and adequate, no other significant changes in hearing or vision, and not active with exercise.  Has seen rheumatology Dr Trudie Reed for OA, taking instaflex she gets OTC.  No new complaints or interval hx Past Medical History:  Diagnosis Date  . Allergy   . Anemia, iron deficiency 03/06/2014  . Anxiety   . Arthritis   . Asthma   . Chronic tension headaches    IN PAST  . Depression   . Fibromyalgia   . GERD (gastroesophageal reflux disease)   . Glaucoma     Per pt, she does not have glaucoma.  . Hypertension   . Internal hemorrhoids   . Iron deficiency anemia   . Osteopenia   . Pyloric stenosis   . Scoliosis   . Thyroid disease    hypothyroidism   Past Surgical History:  Procedure Laterality Date  . APPENDECTOMY  1975  . SHOULDER ARTHROSCOPY  2001   rt shoulder  . TONSILLECTOMY AND ADENOIDECTOMY     72 years old    reports that  has never smoked. she has never used smokeless tobacco. She reports that she does not drink alcohol or use drugs. family history includes Arthritis in her mother; Cancer in her father; Diabetes in her father and mother. Allergies  Allergen Reactions  . Penicillins Hives  . Doxycycline Nausea  Only  . Antihistamines, Diphenhydramine-Type   . Diphenhydramine   . Latex     BREAK OUT IN RASH   Current Outpatient Medications on File Prior to Visit  Medication Sig Dispense Refill  . Ascorbic Acid (VITAMIN C) 500 MG tablet Take 500 mg by mouth daily.      . Calcium-Vitamin D-Vitamin K (CALCIUM + D) [phone] MG-UNT-MCG CHEW Chew 1 tablet by mouth 2 (two) times daily.    . cyclobenzaprine (FLEXERIL) 10 MG tablet TAKE 1 TABLET BY MOUTH EVERY DAY 30 tablet 5  . desvenlafaxine (PRISTIQ) 100 MG 24 hr tablet Take 100 mg by mouth daily.      . ferrous sulfate 325 (65 FE) MG tablet Take 325 mg by mouth daily with breakfast.    . glucosamine-chondroitin 500-400 MG tablet Take 1 tablet by mouth 2 (two) times daily.     Javier Docker Oil 300 MG CAPS Take by mouth daily.    Marland Kitchen levothyroxine (SYNTHROID) 75 MCG tablet Take 1 tablet (75 mcg total) by mouth daily. --needs appt before any further refills 90 tablet 3  . LORazepam (ATIVAN) 1 MG tablet Take 1 mg by mouth 2 (two) times daily as needed.      . Methylcellulose, Laxative, (CITRUCEL PO) Take 1 tablespoon before breakfast in water    . metoprolol succinate (TOPROL-XL) 50 MG 24 hr tablet TAKE ONE AND 1/2 TABS  BY MOUTH IN THE AM DAILY 135 tablet 3  . montelukast (SINGULAIR) 10 MG tablet Take 1 tablet (10 mg total) by mouth daily. 90 tablet 3  . Multiple Vitamins-Minerals (ONE-A-DAY WOMENS 50+ ADVANTAGE) TABS Take 1 tablet by mouth daily.    Marland Kitchen OVER THE COUNTER MEDICATION 2 (two) times daily.     . pantoprazole (PROTONIX) 40 MG tablet Take 1 tablet (40 mg total) by mouth daily. Must keep f/u appt for future refills 90 tablet 3  . Probiotic Product (PROBIOTIC-10 PO) Take by mouth.     No current facility-administered medications on file prior to visit.    Review of Systems Constitutional: Negative for other unusual diaphoresis, sweats, appetite or weight changes HENT: Negative for other worsening hearing loss, ear pain, facial swelling, mouth sores or  neck stiffness.   Eyes: Negative for other worsening pain, redness or other visual disturbance.  Respiratory: Negative for other stridor or swelling Cardiovascular: Negative for other palpitations or other chest pain  Gastrointestinal: Negative for worsening diarrhea or loose stools, blood in stool, distention or other pain Genitourinary: Negative for hematuria, flank pain or other change in urine volume.  Musculoskeletal: Negative for myalgias or other joint swelling.  Skin: Negative for other color change, or other wound or worsening drainage.  Neurological: Negative for other syncope or numbness. Hematological: Negative for other adenopathy or swelling Psychiatric/Behavioral: Negative for hallucinations, other worsening agitation, SI, self-injury, or new decreased concentration All other system neg per pt    Objective:   Physical Exam BP (!) 138/92   Pulse 85   Temp 98.2 F (36.8 C) (Oral)   Ht 4\' 11"  (1.499 m)   Wt 139 lb (63 kg)   SpO2 97%   BMI 28.07 kg/m  VS noted,  Constitutional: Pt is oriented to person, place, and time. Appears well-developed and well-nourished, in no significant distress and comfortable Head: Normocephalic and atraumatic  Eyes: Conjunctivae and EOM are normal. Pupils are equal, round, and reactive to light Right Ear: External ear normal without discharge Left Ear: External ear normal without discharge Nose: Nose without discharge or deformity Mouth/Throat: Oropharynx is without other ulcerations and moist  Neck: Normal range of motion. Neck supple. No JVD present. No tracheal deviation present or significant neck LA or mass Cardiovascular: Normal rate, regular rhythm, normal heart sounds and intact distal pulses.   Pulmonary/Chest: WOB normal and breath sounds without rales or wheezing  Abdominal: Soft. Bowel sounds are normal. NT. No HSM  Musculoskeletal: Normal range of motion. Exhibits no edema Lymphadenopathy: Has no other cervical adenopathy.    Neurological: Pt is alert and oriented to person, place, and time. Pt has normal reflexes. No cranial nerve deficit. Motor grossly intact, Gait intact Skin: Skin is warm and dry. No rash noted or new ulcerations Psychiatric:  Has normal mood and affect. Behavior is normal without agitation No other exam findings    Assessment & Plan:

## 2017-12-01 ENCOUNTER — Encounter: Payer: Self-pay | Admitting: Internal Medicine

## 2017-12-01 LAB — BASIC METABOLIC PANEL
BUN: 13 mg/dL (ref 6–23)
CO2: 27 meq/L (ref 19–32)
CREATININE: 0.63 mg/dL (ref 0.40–1.20)
Calcium: 9.3 mg/dL (ref 8.4–10.5)
Chloride: 95 mEq/L — ABNORMAL LOW (ref 96–112)
GFR: 98.71 mL/min (ref >60.00)
Glucose, Bld: 88 mg/dL (ref 70–99)
Potassium: 4.5 mEq/L (ref 3.5–5.1)
Sodium: 130 mEq/L — ABNORMAL LOW (ref 135–145)

## 2017-12-01 LAB — LIPID PANEL
Cholesterol: 156 mg/dL (ref 0–200)
HDL: 36.4 mg/dL — ABNORMAL LOW (ref >39.00)
NONHDL: 119.93
Total CHOL/HDL Ratio: 4
Triglycerides: 296 mg/dL — ABNORMAL HIGH (ref 0.0–149.0)
VLDL: 59.2 mg/dL — ABNORMAL HIGH (ref 0.0–40.0)

## 2017-12-01 LAB — HEPATIC FUNCTION PANEL
ALK PHOS: 61 U/L (ref 39–117)
ALT: 23 U/L (ref 0–35)
AST: 19 U/L (ref 0–37)
Albumin: 4.1 g/dL (ref 3.5–5.2)
BILIRUBIN DIRECT: 0 mg/dL (ref 0.0–0.3)
TOTAL PROTEIN: 6.5 g/dL (ref 6.0–8.3)
Total Bilirubin: 0.2 mg/dL (ref 0.2–1.2)

## 2017-12-01 LAB — LDL CHOLESTEROL, DIRECT: LDL DIRECT: 97 mg/dL

## 2017-12-01 NOTE — Progress Notes (Signed)
See below

## 2017-12-01 NOTE — Progress Notes (Signed)
shirron to print and send letter

## 2017-12-02 ENCOUNTER — Encounter: Payer: Self-pay | Admitting: Internal Medicine

## 2017-12-02 ENCOUNTER — Encounter: Payer: Medicare Other | Admitting: Internal Medicine

## 2017-12-02 NOTE — Assessment & Plan Note (Signed)

## 2017-12-02 NOTE — Assessment & Plan Note (Signed)
Mild elevated today, states mild nervous with white coat per pt, o/wstable overall by history and exam, recent data reviewed with pt, and pt to continue medical treatment as before as declines change today,  to f/u any worsening symptoms or concerns BP Readings from Last 3 Encounters:  11/30/17 (!) 138/92  04/01/17 132/66  11/26/16 134/78

## 2017-12-24 ENCOUNTER — Other Ambulatory Visit: Payer: Self-pay | Admitting: Internal Medicine

## 2018-01-11 ENCOUNTER — Other Ambulatory Visit: Payer: Self-pay | Admitting: Internal Medicine

## 2018-01-22 ENCOUNTER — Other Ambulatory Visit: Payer: Self-pay | Admitting: Internal Medicine

## 2018-01-23 ENCOUNTER — Other Ambulatory Visit: Payer: Self-pay | Admitting: Internal Medicine

## 2018-01-29 ENCOUNTER — Other Ambulatory Visit (HOSPITAL_COMMUNITY): Payer: Self-pay | Admitting: Psychiatry

## 2018-07-10 ENCOUNTER — Other Ambulatory Visit: Payer: Self-pay | Admitting: Internal Medicine

## 2018-11-18 ENCOUNTER — Other Ambulatory Visit: Payer: Self-pay | Admitting: Internal Medicine

## 2018-11-26 ENCOUNTER — Other Ambulatory Visit: Payer: Self-pay | Admitting: Internal Medicine

## 2018-12-02 ENCOUNTER — Ambulatory Visit (HOSPITAL_COMMUNITY)
Admission: EM | Admit: 2018-12-02 | Discharge: 2018-12-02 | Discharge status: Against Medical Advice | Payer: Medicare Other

## 2018-12-03 ENCOUNTER — Other Ambulatory Visit: Payer: Self-pay

## 2018-12-03 ENCOUNTER — Ambulatory Visit (HOSPITAL_COMMUNITY)
Admission: EM | Admit: 2018-12-03 | Discharge: 2018-12-03 | Disposition: A | Discharge status: Home / Self Care | Payer: Medicare Other | Attending: Family Medicine | Admitting: Family Medicine

## 2018-12-03 ENCOUNTER — Encounter (HOSPITAL_COMMUNITY): Payer: Self-pay | Admitting: Emergency Medicine

## 2018-12-03 DIAGNOSIS — T148XXA Other injury of unspecified body region, initial encounter: Secondary | ICD-10-CM

## 2018-12-03 NOTE — ED Triage Notes (Signed)
Pt presents to [facility] after a trip and fall over the concrete parking marker yesterday.  Had an abrasion to her right upper arm dressed yesterday at another [facility] and she is here for assistance with changing the bandage.  She would also like her right hand and thumb looked at, which she did not notice was scraped yesterday after the fall.

## 2018-12-03 NOTE — ED Notes (Signed)
Wound cleaned with wound cleanser, new dressing placed.

## 2018-12-03 NOTE — ED Provider Notes (Signed)
[place]    CSN: [id number] Arrival date & time: 12/03/18  1542     History   Chief Complaint Chief Complaint  Patient presents with  . Fall  . Hand Pain    HPI Sue Green is a 73 y.o. female.   HPI Patient fell 2 or 3 days ago.  She went to a care center where a dressing was placed on the back of her right arm.  She had a large abrasion.  She is here to have the dressing change.  She states she is not having very much pain or drainage. She has a big bruise on her right hip.  She is using warmth.  She is walking without difficulty.  It is only tender to touch. She did not hit her head.  No loss of consciousness Her tetanus is up-to-date Past Medical History:  Diagnosis Date  . Allergy   . Anemia, iron deficiency 03/06/2014  . Anxiety   . Arthritis   . Asthma   . Chronic tension headaches    IN PAST  . Depression   . Fibromyalgia   . GERD (gastroesophageal reflux disease)   . Glaucoma     Per pt, she does not have glaucoma.  . Hypertension   . Internal hemorrhoids   . Iron deficiency anemia   . Osteopenia   . Pyloric stenosis   . Scoliosis   . Thyroid disease    hypothyroidism    Patient Active Problem List   Diagnosis Date Noted  . Carotid stenosis 11/26/2016  . Acute upper respiratory infection 10/05/2016  . Dysuria 10/05/2016  . Memory loss 08/23/2014  . Acute sinus infection 08/23/2014  . Anemia, iron deficiency 03/06/2014  . Routine general medical examination at a health care facility 07/31/2013  . Urinary urgency 07/17/2012  . Chronic cough 07/17/2012  . Hypertension 07/26/2011  . Anxiety and depression 07/26/2011  . Constipation 07/26/2011  . GE reflux 07/26/2011  . Osteopenia 07/26/2011  . Asthma 07/26/2011  . Tension headache 07/26/2011  . Hypothyroidism 07/26/2011  . History of alcohol abuse 07/26/2011  . Allergic rhinitis 07/26/2011  . Fibromyalgia 07/26/2011    Past Surgical History:  Procedure Laterality Date   . APPENDECTOMY  1975  . SHOULDER ARTHROSCOPY  2001   rt shoulder  . TONSILLECTOMY AND ADENOIDECTOMY     73 years old    OB History   No obstetric history on file.      Home Medications    Prior to Admission medications   Medication Sig Start Date End Date Taking? Authorizing Provider  Ascorbic Acid (VITAMIN C) 500 MG tablet Take 500 mg by mouth daily.      [provider]  Calcium-Vitamin D-Vitamin K (CALCIUM + D) [id number] MG-UNT-MCG CHEW Chew 1 tablet by mouth 2 (two) times daily.    [provider]  cyclobenzaprine (FLEXERIL) 10 MG tablet TAKE 1 TABLET BY MOUTH EVERY DAY 07/11/18   Biagio Borg, MD  desvenlafaxine (PRISTIQ) 100 MG 24 hr tablet Take 100 mg by mouth daily.      [provider]  ferrous sulfate 325 (65 FE) MG tablet Take 325 mg by mouth daily with breakfast.    [provider]  glucosamine-chondroitin 500-400 MG tablet Take 1 tablet by mouth 2 (two) times daily.     [provider]  Astrid Drafts 300 MG CAPS Take by mouth daily.    [provider]  LORazepam (ATIVAN) 1 MG tablet Take 1  mg by mouth 2 (two) times daily as needed.      [provider]  Methylcellulose, Laxative, (CITRUCEL PO) Take 1 tablespoon before breakfast in water    [provider]  metoprolol succinate (TOPROL-XL) 50 MG 24 hr tablet TAKE ONE AND 1/2 TABS BY MOUTH IN THE AM DAILY 11/20/18   Biagio Borg, MD  montelukast (SINGULAIR) 10 MG tablet TAKE 1 TABLET BY MOUTH EVERY DAY 11/27/18   Biagio Borg, MD  Multiple Vitamins-Minerals (ONE-A-DAY WOMENS 50+ ADVANTAGE) TABS Take 1 tablet by mouth daily.    [provider]  OVER THE COUNTER MEDICATION 2 (two) times daily.     [provider]  pantoprazole (PROTONIX) 40 MG tablet TAKE 1 TABLET EVERY DAY 12/26/17   Biagio Borg, MD  Probiotic Product (PROBIOTIC-10 PO) Take by mouth.    [provider]  SYNTHROID 75 MCG tablet TAKE 1 TABLET (75 MCG TOTAL) BY MOUTH  DAILY 01/23/18   Biagio Borg, MD    Family History Family History  Problem Relation Age of Onset  . Diabetes Mother   . Arthritis Mother   . Cancer Father   . Diabetes Father   . Colon cancer Neg Hx     Social History Social History   Tobacco Use  . Smoking status: Never Smoker  . Smokeless tobacco: Never Used  Substance Use Topics  . 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 Drug use: No     Allergies   Penicillins; Doxycycline; Antihistamines, diphenhydramine-type; Diphenhydramine; and Latex   Review of Systems Review of Systems  Constitutional: Negative for chills and fever.  HENT: Negative for ear pain and sore throat.   Eyes: Negative for pain and visual disturbance.  Respiratory: Negative for cough and shortness of breath.   Cardiovascular: Negative for chest pain and palpitations.  Gastrointestinal: Negative for abdominal pain and vomiting.  Genitourinary: Negative for dysuria and hematuria.  Musculoskeletal: Negative for arthralgias and back pain.  Skin: Positive for rash. Negative for color change.  Neurological: Negative for seizures and syncope.  All other systems reviewed and are negative.    Physical Exam Triage Vital Signs ED Triage Vitals [12/03/18 1600]  Enc Vitals Group     BP (!) 157/86     Pulse Rate 92     Resp 18     Temp 98.4 F (36.9 C)     Temp Source Oral     SpO2 97 %     Weight      Height      Head Circumference      Peak Flow      Pain Score 1     Pain Loc      Pain Edu?      Excl. in [place]?    No data found.  Updated Vital Signs BP (!) 157/86 (BP Location: Left Wrist) Comment (BP Location): Pt unable to tolerate BP on upper arm  Pulse 92   Temp 98.4 F (36.9 C) (Oral)   Resp 18   SpO2 97%   Visual Acuity Right Eye Distance:   Left Eye Distance:   Bilateral Distance:    Right Eye Near:   Left Eye Near:    Bilateral Near:     Physical Exam Constitutional:       General: She is not in acute distress.    Appearance: She is well-developed.  HENT:  Head: Normocephalic and atraumatic.  Eyes:     Conjunctiva/sclera: Conjunctivae normal.     Pupils: Pupils are equal, round, and reactive to light.  Neck:     Musculoskeletal: Normal range of motion.  Cardiovascular:     Rate and Rhythm: Normal rate.  Pulmonary:     Effort: Pulmonary effort is normal. No respiratory distress.  Abdominal:     General: There is no distension.     Palpations: Abdomen is soft.  Musculoskeletal: Normal range of motion.  Skin:    General: Skin is warm and dry.     Comments: The back of the right arm has superficial abrasion that extends from just below the shoulder to the elbow.  It is healing well.  No redness, no drainage.  No tenderness.  Neurological:     Mental Status: She is alert.      UC Treatments / Results  Labs (all labs ordered are listed, but only abnormal results are displayed) Labs Reviewed - No data to display  EKG None  Radiology No results found.  Procedures Procedures (including critical care time)  Medications Ordered in UC Medications - No data to display  Initial Impression / Assessment and Plan / UC Course  I have reviewed the triage vital signs and the nursing notes.  Pertinent labs & imaging results that were available during my care of the patient were reviewed by me and considered in my medical decision making (see chart for details).     Discussed wound care. Final Clinical Impressions(s) / UC Diagnoses   Final diagnoses:  Abrasion     Discharge Instructions     You can leave this dressing on for the next 1 to 2 days After this you should wash the wound 1-2 times a day and apply a thin coat of antibacterial ointment You may leave the wound open   ED Prescriptions    None     Controlled Substance Prescriptions  Controlled Substance Registry consulted? Not Applicable   Raylene Everts, MD 12/03/18  [phone]

## 2018-12-03 NOTE — Discharge Instructions (Signed)
You can leave this dressing on for the next 1 to 2 days After this you should wash the wound 1-2 times a day and apply a thin coat of antibacterial ointment You may leave the wound open

## 2018-12-05 ENCOUNTER — Ambulatory Visit (HOSPITAL_COMMUNITY)
Admission: EM | Admit: 2018-12-05 | Discharge: 2018-12-05 | Disposition: A | Discharge status: Home / Self Care | Payer: Medicare Other | Attending: Family Medicine | Admitting: Family Medicine

## 2018-12-05 ENCOUNTER — Encounter (HOSPITAL_COMMUNITY): Payer: Self-pay | Admitting: Emergency Medicine

## 2018-12-05 ENCOUNTER — Encounter: Payer: Medicare Other | Admitting: Internal Medicine

## 2018-12-05 DIAGNOSIS — S40811A Abrasion of right upper arm, initial encounter: Secondary | ICD-10-CM | POA: Diagnosis not present

## 2018-12-05 DIAGNOSIS — W19XXXA Unspecified fall, initial encounter: Secondary | ICD-10-CM | POA: Diagnosis not present

## 2018-12-05 DIAGNOSIS — S7001XA Contusion of right hip, initial encounter: Secondary | ICD-10-CM | POA: Diagnosis not present

## 2018-12-05 DIAGNOSIS — Z5189 Encounter for other specified aftercare: Secondary | ICD-10-CM

## 2018-12-05 NOTE — ED Provider Notes (Signed)
[place]    CSN: [id number] Arrival date & time: 12/05/18  1449     History   Chief Complaint Chief Complaint  Patient presents with  . Wound Check    appt 1445    HPI Sue Green is a 73 y.o. female.   HPI  Here for wound check Feels well  Past Medical History:  Diagnosis Date  . Allergy   . Anemia, iron deficiency 03/06/2014  . Anxiety   . Arthritis   . Asthma   . Chronic tension headaches    IN PAST  . Depression   . Fibromyalgia   . GERD (gastroesophageal reflux disease)   . Glaucoma     Per pt, she does not have glaucoma.  . Hypertension   . Internal hemorrhoids   . Iron deficiency anemia   . Osteopenia   . Pyloric stenosis   . Scoliosis   . Thyroid disease    hypothyroidism    Patient Active Problem List   Diagnosis Date Noted  . Carotid stenosis 11/26/2016  . Acute upper respiratory infection 10/05/2016  . Dysuria 10/05/2016  . Memory loss 08/23/2014  . Acute sinus infection 08/23/2014  . Anemia, iron deficiency 03/06/2014  . Routine general medical examination at a health care facility 07/31/2013  . Urinary urgency 07/17/2012  . Chronic cough 07/17/2012  . Hypertension 07/26/2011  . Anxiety and depression 07/26/2011  . Constipation 07/26/2011  . GE reflux 07/26/2011  . Osteopenia 07/26/2011  . Asthma 07/26/2011  . Tension headache 07/26/2011  . Hypothyroidism 07/26/2011  . History of alcohol abuse 07/26/2011  . Allergic rhinitis 07/26/2011  . Fibromyalgia 07/26/2011    Past Surgical History:  Procedure Laterality Date  . APPENDECTOMY  1975  . SHOULDER ARTHROSCOPY  2001   rt shoulder  . TONSILLECTOMY AND ADENOIDECTOMY     73 years old    OB History   No obstetric history on file.      Home Medications    Prior to Admission medications   Medication Sig Start Date End Date Taking? Authorizing Provider  Ascorbic Acid (VITAMIN C) 500 MG tablet Take 500 mg by mouth daily.      [provider]   Calcium-Vitamin D-Vitamin K (CALCIUM + D) [phone] MG-UNT-MCG CHEW Chew 1 tablet by mouth 2 (two) times daily.    [provider]  cyclobenzaprine (FLEXERIL) 10 MG tablet TAKE 1 TABLET BY MOUTH EVERY DAY 07/11/18   Biagio Borg, MD  desvenlafaxine (PRISTIQ) 100 MG 24 hr tablet Take 100 mg by mouth daily.      [provider]  ferrous sulfate 325 (65 FE) MG tablet Take 325 mg by mouth daily with breakfast.    [provider]  glucosamine-chondroitin 500-400 MG tablet Take 1 tablet by mouth 2 (two) times daily.     [provider]  Astrid Drafts 300 MG CAPS Take by mouth daily.    [provider]  LORazepam (ATIVAN) 1 MG tablet Take 1 mg by mouth 2 (two) times daily as needed.      [provider]  Methylcellulose, Laxative, (CITRUCEL PO) Take 1 tablespoon before breakfast in water    [provider]  metoprolol succinate (TOPROL-XL) 50 MG 24 hr tablet TAKE ONE AND 1/2 TABS BY MOUTH IN THE AM DAILY 11/20/18   Biagio Borg, MD  montelukast (SINGULAIR) 10 MG tablet TAKE 1 TABLET BY MOUTH EVERY DAY 11/27/18   Biagio Borg, MD  Multiple Vitamins-Minerals (  ONE-A-DAY WOMENS 50+ ADVANTAGE) TABS Take 1 tablet by mouth daily.    [provider]  OVER THE COUNTER MEDICATION 2 (two) times daily.     [provider]  pantoprazole (PROTONIX) 40 MG tablet TAKE 1 TABLET EVERY DAY 12/26/17   Biagio Borg, MD  Probiotic Product (PROBIOTIC-10 PO) Take by mouth.    [provider]  SYNTHROID 75 MCG tablet TAKE 1 TABLET (75 MCG TOTAL) BY MOUTH DAILY 01/23/18   Biagio Borg, MD    Family History Family History  Problem Relation Age of Onset  . Diabetes Mother   . Arthritis Mother   . Cancer Father   . Diabetes Father   . Colon cancer Neg Hx     Social History Social History   Tobacco Use  . Smoking status: Never Smoker  . Smokeless tobacco: Never Used  Substance Use Topics  . Alcohol use: No    Alcohol/week: 0.0  standard drinks    Comment: sober x 14 years  . Drug use: No     Allergies   Penicillins; Doxycycline; Antihistamines, diphenhydramine-type; Diphenhydramine; and Latex   Review of Systems Review of Systems  Constitutional: Negative for chills and fever.  HENT: Negative for ear pain and sore throat.   Eyes: Negative for pain and visual disturbance.  Respiratory: Negative for cough and shortness of breath.   Cardiovascular: Negative for chest pain and palpitations.  Gastrointestinal: Negative for abdominal pain and vomiting.  Genitourinary: Negative for dysuria and hematuria.  Musculoskeletal: Negative for arthralgias and back pain.  Skin: Positive for color change and wound. Negative for rash.  Neurological: Negative for seizures and syncope.  All other systems reviewed and are negative.    Physical Exam Triage Vital Signs ED Triage Vitals [12/05/18 1520]  Enc Vitals Group     BP 122/65     Pulse Rate 80     Resp 18     Temp 98.1 F (36.7 C)     Temp Source Oral     SpO2 100 %     Weight      Height      Head Circumference      Peak Flow      Pain Score 0     Pain Loc      Pain Edu?      Excl. in [place]?    No data found.  Updated Vital Signs BP 122/65 (BP Location: Left Arm)   Pulse 80   Temp 98.1 F (36.7 C) (Oral)   Resp 18   SpO2 100%      Physical Exam Constitutional:      General: She is not in acute distress.    Appearance: She is well-developed and normal weight.     Comments: Small woman, stooped posture  HENT:     Head: Normocephalic and atraumatic.  Eyes:     Conjunctiva/sclera: Conjunctivae normal.     Pupils: Pupils are equal, round, and reactive to light.  Neck:     Musculoskeletal: Normal range of motion.  Cardiovascular:     Rate and Rhythm: Normal rate.  Pulmonary:     Effort: Pulmonary effort is normal. No respiratory distress.  Abdominal:     General: There is no distension.     Palpations: Abdomen is soft.  Musculoskeletal:  Normal range of motion.  Skin:    General: Skin is warm and dry.     Comments: Abrasion on the back of the right arm is  healing.  There is some resolving bruising.  All of the wound is very superficial.  Full range of motion of shoulder elbow wrist and hand both upper extremities.  The bruise on the right hip is resolving.  No tenderness  Neurological:     Mental Status: She is alert.      UC Treatments / Results  Labs (all labs ordered are listed, but only abnormal results are displayed) Labs Reviewed - No data to display  EKG None  Radiology No results found.  Procedures Procedures (including critical care time)  Medications Ordered in UC Medications - No data to display  Initial Impression / Assessment and Plan / UC Course  I have reviewed the triage vital signs and the nursing notes.  Pertinent labs & imaging results that were available during my care of the patient were reviewed by me and considered in my medical decision making (see chart for details).      Final Clinical Impressions(s) / UC Diagnoses   Final diagnoses:  Visit for wound check     Discharge Instructions     Return here as needed It was a pleasure meeting you   ED Prescriptions    None     Controlled Substance Prescriptions [place] Controlled Substance Registry consulted? Not Applicable   Raylene Everts, MD 12/05/18 1537

## 2018-12-05 NOTE — ED Triage Notes (Signed)
Pt here for wound check to right arm after trip and fall; pt seen Sunday and told to return

## 2018-12-05 NOTE — Discharge Instructions (Addendum)
Return here as needed It was a pleasure meeting you

## 2018-12-12 ENCOUNTER — Telehealth: Payer: Self-pay

## 2018-12-12 NOTE — Telephone Encounter (Signed)
Pt screened for covid-19  Denies all covid-19 symptoms  Informed not additional visitors at the [facility] at this time. Pt expressed understanding.

## 2018-12-13 ENCOUNTER — Encounter: Payer: Self-pay | Admitting: Internal Medicine

## 2018-12-13 ENCOUNTER — Other Ambulatory Visit: Payer: Self-pay

## 2018-12-13 ENCOUNTER — Other Ambulatory Visit (INDEPENDENT_AMBULATORY_CARE_PROVIDER_SITE_OTHER): Payer: Medicare Other

## 2018-12-13 ENCOUNTER — Ambulatory Visit (INDEPENDENT_AMBULATORY_CARE_PROVIDER_SITE_OTHER): Payer: Medicare Other | Admitting: Internal Medicine

## 2018-12-13 VITALS — BP 124/78 | HR 92 | Temp 97.6°F | Ht 59.0 in | Wt 133.0 lb

## 2018-12-13 DIAGNOSIS — Z0000 Encounter for general adult medical examination without abnormal findings: Secondary | ICD-10-CM

## 2018-12-13 LAB — CBC WITH DIFFERENTIAL/PLATELET
Basophils Absolute: 0.1 10*3/uL (ref 0.0–0.1)
Basophils Relative: 0.8 % (ref 0.0–3.0)
Eosinophils Absolute: 0.2 10*3/uL (ref 0.0–0.7)
Eosinophils Relative: 3.3 % (ref 0.0–5.0)
HEMATOCRIT: 38.6 % (ref 36.0–46.0)
Hemoglobin: 13.2 g/dL (ref 12.0–15.0)
Lymphocytes Relative: 16 % (ref 12.0–46.0)
Lymphs Abs: 1.2 10*3/uL (ref 0.7–4.0)
MCHC: 34.2 g/dL (ref 30.0–36.0)
MCV: 98.7 fl (ref 78.0–100.0)
MONOS PCT: 11.1 % (ref 3.0–12.0)
Monocytes Absolute: 0.8 10*3/uL (ref 0.1–1.0)
Neutro Abs: 5.2 10*3/uL (ref 1.4–7.7)
Neutrophils Relative %: 68.8 % (ref 43.0–77.0)
Platelets: 420 10*3/uL — ABNORMAL HIGH (ref 150.0–400.0)
RBC: 3.91 Mil/uL (ref 3.87–5.11)
RDW: 12.9 % (ref 11.5–15.5)
WBC: 7.6 10*3/uL (ref 4.0–10.5)

## 2018-12-13 LAB — TSH: TSH: 1.22 u[IU]/mL (ref 0.35–4.50)

## 2018-12-13 LAB — LIPID PANEL
Cholesterol: 174 mg/dL (ref 0–200)
HDL: 39.1 mg/dL (ref >39.00)
NonHDL: 134.87
Total CHOL/HDL Ratio: 4
Triglycerides: 378 mg/dL — ABNORMAL HIGH (ref 0.0–149.0)
VLDL: 75.6 mg/dL — ABNORMAL HIGH (ref 0.0–40.0)

## 2018-12-13 LAB — HEPATIC FUNCTION PANEL
ALBUMIN: 4.3 g/dL (ref 3.5–5.2)
ALT: 16 U/L (ref 0–35)
AST: 16 U/L (ref 0–37)
Alkaline Phosphatase: 72 U/L (ref 39–117)
Bilirubin, Direct: 0 mg/dL (ref 0.0–0.3)
Total Bilirubin: 0.2 mg/dL (ref 0.2–1.2)
Total Protein: 6.9 g/dL (ref 6.0–8.3)

## 2018-12-13 LAB — BASIC METABOLIC PANEL
BUN: 15 mg/dL (ref 6–23)
CO2: 30 mEq/L (ref 19–32)
Calcium: 9.4 mg/dL (ref 8.4–10.5)
Chloride: 97 mEq/L (ref 96–112)
Creatinine, Ser: 0.66 mg/dL (ref 0.40–1.20)
GFR: 87.76 mL/min (ref >60.00)
Glucose, Bld: 95 mg/dL (ref 70–99)
Potassium: 4.4 mEq/L (ref 3.5–5.1)
Sodium: 133 mEq/L — ABNORMAL LOW (ref 135–145)

## 2018-12-13 LAB — LDL CHOLESTEROL, DIRECT: LDL DIRECT: 98 mg/dL

## 2018-12-13 NOTE — Patient Instructions (Signed)

## 2018-12-13 NOTE — Progress Notes (Signed)
Subjective:    Patient ID: Sue Green, female    DOB: [birth year], 73 y.o.   MRN: [id number]  HPI  Here for wellness and f/u;  Overall doing ok;  Pt denies Chest pain, worsening SOB, DOE, wheezing, orthopnea, PND, worsening LE edema, palpitations, dizziness or syncope.  Pt denies neurological change such as new headache, facial or extremity weakness.  Pt denies polydipsia, polyuria, or low sugar symptoms. Pt states overall good compliance with treatment and medications, good tolerability, and has been trying to follow appropriate diet.  Pt denies worsening depressive symptoms, suicidal ideation or panic. No fever, night sweats, wt loss, loss of appetite, or other constitutional symptoms.  Pt states good ability with ADL's, has low fall risk, home safety reviewed and adequate, no other significant changes in hearing or vision, and only occasionally active with exercise. Did have a trip and fall to back of head about 3 wks ago, still with tender area, then fell in a parking lot at the [employer] tripped over a concrete parking thing to right shoulder and hip, seen at UC, advised for ICE and f/u prn. No other new complaints Past Medical History:  Diagnosis Date  . Allergy   . Anemia, iron deficiency 03/06/2014  . Anxiety   . Arthritis   . Asthma   . Chronic tension headaches    IN PAST  . Depression   . Fibromyalgia   . GERD (gastroesophageal reflux disease)   . Glaucoma     Per pt, she does not have glaucoma.  . Hypertension   . Internal hemorrhoids   . Iron deficiency anemia   . Osteopenia   . Pyloric stenosis   . Scoliosis   . Thyroid disease    hypothyroidism   Past Surgical History:  Procedure Laterality Date  . APPENDECTOMY  1975  . SHOULDER ARTHROSCOPY  2001   rt shoulder  . TONSILLECTOMY AND ADENOIDECTOMY     73 years old    reports that she has never smoked. She has never used smokeless tobacco. She reports that she does not drink alcohol or use drugs. family history  includes Arthritis in her mother; Cancer in her father; Diabetes in her father and mother. Allergies  Allergen Reactions  . Penicillins Hives  . Doxycycline Nausea Only  . Antihistamines, Diphenhydramine-Type   . Diphenhydramine   . Latex     BREAK OUT IN RASH   Current Outpatient Medications on File Prior to Visit  Medication Sig Dispense Refill  . Ascorbic Acid (VITAMIN C) 500 MG tablet Take 500 mg by mouth daily.      . Calcium-Vitamin D-Vitamin K (CALCIUM + D) [phone] MG-UNT-MCG CHEW Chew 1 tablet by mouth 2 (two) times daily.    . cyclobenzaprine (FLEXERIL) 10 MG tablet TAKE 1 TABLET BY MOUTH EVERY DAY 30 tablet 5  . desvenlafaxine (PRISTIQ) 100 MG 24 hr tablet Take 100 mg by mouth daily.      . ferrous sulfate 325 (65 FE) MG tablet Take 325 mg by mouth daily with breakfast.    . glucosamine-chondroitin 500-400 MG tablet Take 1 tablet by mouth 2 (two) times daily.     Javier Docker Oil 300 MG CAPS Take by mouth daily.    Marland Kitchen LORazepam (ATIVAN) 1 MG tablet Take 1 mg by mouth 2 (two) times daily as needed.      . Methylcellulose, Laxative, (CITRUCEL PO) Take 1 tablespoon before breakfast in water    . metoprolol succinate (TOPROL-XL) 50 MG  24 hr tablet TAKE ONE AND 1/2 TABS BY MOUTH IN THE AM DAILY 135 tablet 1  . montelukast (SINGULAIR) 10 MG tablet TAKE 1 TABLET BY MOUTH EVERY DAY 90 tablet 1  . Multiple Vitamins-Minerals (ONE-A-DAY WOMENS 50+ ADVANTAGE) TABS Take 1 tablet by mouth daily.    Marland Kitchen OVER THE COUNTER MEDICATION 2 (two) times daily.     . pantoprazole (PROTONIX) 40 MG tablet TAKE 1 TABLET EVERY DAY 90 tablet 3  . Probiotic Product (PROBIOTIC-10 PO) Take by mouth.    . SYNTHROID 75 MCG tablet TAKE 1 TABLET (75 MCG TOTAL) BY MOUTH DAILY 90 tablet 3   No current facility-administered medications on file prior to visit.    Review of Systems Constitutional: Negative for other unusual diaphoresis, sweats, appetite or weight changes HENT: Negative for other worsening hearing loss,  ear pain, facial swelling, mouth sores or neck stiffness.   Eyes: Negative for other worsening pain, redness or other visual disturbance.  Respiratory: Negative for other stridor or swelling Cardiovascular: Negative for other palpitations or other chest pain  Gastrointestinal: Negative for worsening diarrhea or loose stools, blood in stool, distention or other pain Genitourinary: Negative for hematuria, flank pain or other change in urine volume.  Musculoskeletal: Negative for myalgias or other joint swelling.  Skin: Negative for other color change, or other wound or worsening drainage.  Neurological: Negative for other syncope or numbness. Hematological: Negative for other adenopathy or swelling Psychiatric/Behavioral: Negative for hallucinations, other worsening agitation, SI, self-injury, or new decreased concentration All other system neg per pt    Objective:   Physical Exam BP 124/78   Pulse 92   Temp 97.6 F (36.4 C) (Oral)   Ht 4\' 11"  (1.499 m)   Wt 133 lb (60.3 kg)   SpO2 97%   BMI 26.86 kg/m  VS noted,  Constitutional: Pt is oriented to person, place, and time. Appears well-developed and well-nourished, in no significant distress and comfortable Head: Normocephalic and atraumatic  Eyes: Conjunctivae and EOM are normal. Pupils are equal, round, and reactive to light Right Ear: External ear normal without discharge Left Ear: External ear normal without discharge Nose: Nose without discharge or deformity Mouth/Throat: Oropharynx is without other ulcerations and moist  Neck: Normal range of motion. Neck supple. No JVD present. No tracheal deviation present or significant neck LA or mass Cardiovascular: Normal rate, regular rhythm, normal heart sounds and intact distal pulses.   Pulmonary/Chest: WOB normal and breath sounds without rales or wheezing  Abdominal: Soft. Bowel sounds are normal. NT. No HSM  Musculoskeletal: Normal range of motion. Exhibits no edema  Lymphadenopathy: Has no other cervical adenopathy.  Neurological: Pt is alert and oriented to person, place, and time. Pt has normal reflexes. No cranial nerve deficit. Motor grossly intact, Gait intact Skin: Skin is warm and dry. No rash noted or new ulcerations Psychiatric:  Has normal mood and affect. Behavior is normal without agitation No other exam findings  Lab Results  Component Value Date   WBC 7.6 11/30/2017   HGB 12.3 11/30/2017   HCT 35.7 (L) 11/30/2017   PLT 367.0 11/30/2017   GLUCOSE 88 11/30/2017   CHOL 156 11/30/2017   TRIG 296.0 (H) 11/30/2017   HDL 36.40 (L) 11/30/2017   LDLDIRECT 97.0 11/30/2017   LDLCALC 119 (H) 07/31/2013   ALT 23 11/30/2017   AST 19 11/30/2017   NA 130 (L) 11/30/2017   K 4.5 11/30/2017   CL 95 (L) 11/30/2017   CREATININE 0.63 11/30/2017  BUN 13 11/30/2017   CO2 27 11/30/2017   TSH 1.34 11/30/2017         Assessment & Plan:

## 2018-12-13 NOTE — Assessment & Plan Note (Signed)

## 2018-12-18 ENCOUNTER — Other Ambulatory Visit: Payer: Self-pay | Admitting: Internal Medicine

## 2019-01-07 ENCOUNTER — Other Ambulatory Visit: Payer: Self-pay | Admitting: Internal Medicine

## 2019-01-26 ENCOUNTER — Other Ambulatory Visit: Payer: Self-pay | Admitting: Internal Medicine

## 2019-04-13 ENCOUNTER — Other Ambulatory Visit: Payer: Self-pay | Admitting: Internal Medicine

## 2019-05-30 ENCOUNTER — Other Ambulatory Visit: Payer: Self-pay | Admitting: Internal Medicine

## 2019-06-08 ENCOUNTER — Encounter: Payer: Self-pay | Admitting: Internal Medicine

## 2019-06-08 ENCOUNTER — Ambulatory Visit (INDEPENDENT_AMBULATORY_CARE_PROVIDER_SITE_OTHER): Payer: Medicare Other | Admitting: Internal Medicine

## 2019-06-08 ENCOUNTER — Other Ambulatory Visit: Payer: Self-pay

## 2019-06-08 DIAGNOSIS — I10 Essential (primary) hypertension: Secondary | ICD-10-CM

## 2019-06-08 DIAGNOSIS — F329 Major depressive disorder, single episode, unspecified: Secondary | ICD-10-CM

## 2019-06-08 DIAGNOSIS — M67441 Ganglion, right hand: Secondary | ICD-10-CM | POA: Diagnosis not present

## 2019-06-08 DIAGNOSIS — J4520 Mild intermittent asthma, uncomplicated: Secondary | ICD-10-CM

## 2019-06-08 DIAGNOSIS — F419 Anxiety disorder, unspecified: Secondary | ICD-10-CM | POA: Diagnosis not present

## 2019-06-08 DIAGNOSIS — F32A Depression, unspecified: Secondary | ICD-10-CM

## 2019-06-08 MED ORDER — DICLOFENAC SODIUM 1 % TD GEL: 2.0000 g | Freq: Four times a day (QID) | TRANSDERMAL | 5 refills | Status: DC

## 2019-06-08 NOTE — Progress Notes (Signed)
Subjective:    Patient ID: Sue Green, female    DOB: September 27, 1945, 73 y.o.   MRN: [id number]  HPI  Here to f/u with c/o swelling area to the PIP of the right 4th finger with mild tender, seemed to come up a few wks ago, nothing makes better or worse, without trauma or fever or hx of gout.  Pt denies chest pain, increased sob or doe, wheezing, orthopnea, PND, increased LE swelling, palpitations, dizziness or syncope.  Pt denies new neurological symptoms such as new headache, or facial or extremity weakness or numbness   Pt denies polydipsia, polyuria.  Pt is most adamant she needs allopurinol 100 mg daily as her friend has "the same thing" and is certain she knows she needs this for gout. Past Medical History:  Diagnosis Date  . Allergy   . Anemia, iron deficiency 03/06/2014  . Anxiety   . Arthritis   . Asthma   . Chronic tension headaches    IN PAST  . Depression   . Fibromyalgia   . GERD (gastroesophageal reflux disease)   . Glaucoma     Per pt, she does not have glaucoma.  . Hypertension   . Internal hemorrhoids   . Iron deficiency anemia   . Osteopenia   . Pyloric stenosis   . Scoliosis   . Thyroid disease    hypothyroidism   Past Surgical History:  Procedure Laterality Date  . APPENDECTOMY  1975  . SHOULDER ARTHROSCOPY  2001   rt shoulder  . TONSILLECTOMY AND ADENOIDECTOMY     73 years old    reports that she has never smoked. She has never used smokeless tobacco. She reports that she does not drink alcohol or use drugs. family history includes Arthritis in her mother; Cancer in her father; Diabetes in her father and mother. Allergies  Allergen Reactions  . Penicillins Hives  . Doxycycline Nausea Only  . Antihistamines, Diphenhydramine-Type   . Diphenhydramine   . Latex     BREAK OUT IN RASH   Current Outpatient Medications on File Prior to Visit  Medication Sig Dispense Refill  . Ascorbic Acid (VITAMIN C) 500 MG tablet Take 500 mg by mouth daily.      .  Calcium-Vitamin D-Vitamin K (CALCIUM + D) [phone] MG-UNT-MCG CHEW Chew 1 tablet by mouth 2 (two) times daily.    . cyclobenzaprine (FLEXERIL) 10 MG tablet TAKE 1 TABLET BY MOUTH EVERY DAY 30 tablet 5  . desvenlafaxine (PRISTIQ) 100 MG 24 hr tablet Take 100 mg by mouth daily.      . ferrous sulfate 325 (65 FE) MG tablet Take 325 mg by mouth daily with breakfast.    . glucosamine-chondroitin 500-400 MG tablet Take 1 tablet by mouth 2 (two) times daily.     Javier Docker Oil 300 MG CAPS Take by mouth daily.    Marland Kitchen LORazepam (ATIVAN) 1 MG tablet Take 1 mg by mouth 2 (two) times daily as needed.      . Methylcellulose, Laxative, (CITRUCEL PO) Take 1 tablespoon before breakfast in water    . metoprolol succinate (TOPROL-XL) 50 MG 24 hr tablet TAKE ONE AND 1/2 TABS BY MOUTH IN THE MORNING 135 tablet 1  . montelukast (SINGULAIR) 10 MG tablet TAKE 1 TABLET BY MOUTH EVERY DAY 90 tablet 1  . Multiple Vitamins-Minerals (ONE-A-DAY WOMENS 50+ ADVANTAGE) TABS Take 1 tablet by mouth daily.    Marland Kitchen OVER THE COUNTER MEDICATION 2 (two) times daily.     Marland Kitchen  pantoprazole (PROTONIX) 40 MG tablet TAKE 1 TABLET BY MOUTH EVERY DAY 90 tablet 3  . Probiotic Product (PROBIOTIC-10 PO) Take by mouth.    . SYNTHROID 75 MCG tablet TAKE 1 TABLET BY MOUTH EVERY DAY 90 tablet 3   No current facility-administered medications on file prior to visit.    Review of Systems  Constitutional: Negative for other unusual diaphoresis or sweats HENT: Negative for ear discharge or swelling Eyes: Negative for other worsening visual disturbances Respiratory: Negative for stridor or other swelling  Gastrointestinal: Negative for worsening distension or other blood Genitourinary: Negative for retention or other urinary change Musculoskeletal: Negative for other MSK pain or swelling Skin: Negative for color change or other new lesions Neurological: Negative for worsening tremors and other numbness  Psychiatric/Behavioral: Negative for worsening  agitation or other fatigue All otherwise neg per pt    Objective:   Physical Exam BP 138/80   Pulse 93   Temp 98.3 F (36.8 C) (Oral)   Resp 16   Ht 4\' 11"  (1.499 m)   Wt 129 lb (58.5 kg)   SpO2 98%   BMI 26.05 kg/m  VS noted,  Constitutional: Pt appears in NAD HENT: Head: NCAT.  Right Ear: External ear normal.  Left Ear: External ear normal.  Eyes: . Pupils are equal, round, and reactive to light. Conjunctivae and EOM are normal Nose: without d/c or deformity Neck: Neck supple. Gross normal ROM Cardiovascular: Normal rate and regular rhythm.   Pulmonary/Chest: Effort normal and breath sounds without rales or wheezing.  Right 4th finger PIP post aspect with joint related cystic mass about 10 mm and raised, mild tender to palpate without other erythema or soft tissue swelling, has FROM of the joint and o/w neurovasc intact Neurological: Pt is alert. At baseline orientation, motor grossly intact Skin: Skin is warm. No rashes, other new lesions, no LE edema Psychiatric: Pt behavior is normal without agitation  All otherwise neg per pt  Lab Results  Component Value Date   WBC 7.6 12/13/2018   HGB 13.2 12/13/2018   HCT 38.6 12/13/2018   PLT 420.0 (H) 12/13/2018   GLUCOSE 95 12/13/2018   CHOL 174 12/13/2018   TRIG 378.0 (H) 12/13/2018   HDL 39.10 12/13/2018   LDLDIRECT 98.0 12/13/2018   LDLCALC 119 (H) 07/31/2013   ALT 16 12/13/2018   AST 16 12/13/2018   NA 133 (L) 12/13/2018   K 4.4 12/13/2018   CL 97 12/13/2018   CREATININE 0.66 12/13/2018   BUN 15 12/13/2018   CO2 30 12/13/2018   TSH 1.22 12/13/2018       Assessment & Plan:

## 2019-06-08 NOTE — Patient Instructions (Signed)
Ok to try the voltaren gel topically as needed for the finger cyst pain  Please see Dr Tamala Julian in this office for second opinion if you like  Please call if you change your mind about the referral to Hand Surgury  Please continue all other medications as before, and refills have been done if requested.  Please have the pharmacy call with any other refills you may need.  Please continue your efforts at being more active, low cholesterol diet, and weight control.  Please keep your appointments with your specialists as you may have planned

## 2019-06-08 NOTE — Assessment & Plan Note (Signed)
stable overall by history and exam, recent data reviewed with pt, and pt to continue medical treatment as before,  to f/u any worsening symptoms or concerns  

## 2019-06-08 NOTE — Assessment & Plan Note (Signed)
D/w pt this is not at all suggestive of acute or chronic gout, and does not need allopurinol.  Pt is crestfallen and upset that I am not doing as she requests and immediately requests second opinion, preferably now instantly.  I am not able to accomodate this and asked her to see Dr Smith/sports medicine for a second opinion if she likes by making an appt.  I would like to refer her to hand surgury but she declines. [place] for volt gel topical prn,  to f/u any worsening symptoms or concerns

## 2019-06-08 NOTE — Assessment & Plan Note (Signed)
Overall stable but has some situational stress reaction today minor, without significant aggressiveness or abuse in any way

## 2019-06-29 ENCOUNTER — Other Ambulatory Visit: Payer: Self-pay | Admitting: Internal Medicine

## 2019-07-03 NOTE — Progress Notes (Unsigned)
Subjective:   Sue Green is a 73 y.o. female who presents for Medicare Annual (Subsequent) preventive examination. I connected with patient by a telephone and verified that I am speaking with the correct person using two identifiers. Patient stated full name and DOB. Patient gave permission to continue with telephonic visit. Patient's location was at home and Nurse's location was at [place] office.   Review of Systems:     Sleep patterns: {SX; SLEEP PATTERNS:18802::"feels rested on waking","does not get up to void","gets up *** times nightly to void","sleeps *** hours nightly"}.    Home Safety/Smoke Alarms: Feels safe in home. Smoke alarms in place.  Living environment; residence and Firearm Safety: {Rehab home environment / accessibility:30080::"no firearms","firearms stored safely"}. Seat Belt Safety/Bike Helmet: Wears seat belt.      Objective:     Vitals: There were no vitals taken for this visit.  There is no height or weight on file to calculate BMI.  No flowsheet data found.  Tobacco Social History   Tobacco Use  Smoking Status Never Smoker  Smokeless Tobacco Never Used     Counseling given: Not Answered  Past Medical History:  Diagnosis Date  . Allergy   . Anemia, iron deficiency 03/06/2014  . Anxiety   . Arthritis   . Asthma   . Chronic tension headaches    IN PAST  . Depression   . Fibromyalgia   . GERD (gastroesophageal reflux disease)   . Glaucoma     Per pt, she does not have glaucoma.  . Hypertension   . Internal hemorrhoids   . Iron deficiency anemia   . Osteopenia   . Pyloric stenosis   . Scoliosis   . Thyroid disease    hypothyroidism   Past Surgical History:  Procedure Laterality Date  . APPENDECTOMY  1975  . SHOULDER ARTHROSCOPY  2001   rt shoulder  . TONSILLECTOMY AND ADENOIDECTOMY     73 years old   Family History  Problem Relation Age of Onset  . Diabetes Mother   . Arthritis Mother   . Cancer Father   . Diabetes Father    . Colon cancer Neg Hx    Social History   Socioeconomic History  . Marital status: Divorced    Spouse name: Not on file  . Number of children: Not on file  . Years of education: 16  . Highest education level: Not on file  Occupational History  . Occupation: Retired  Scientific laboratory technician  . Financial resource strain: Not on file  . Food insecurity    Worry: Not on file    Inability: Not on file  . Transportation needs    Medical: Not on file    Non-medical: Not on file  Tobacco Use  . Smoking status: Never Smoker  . Smokeless tobacco: Never Used  Substance and Sexual Activity  . 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 Drug use: No  . Sexual activity: Not on file  Lifestyle  . Physical activity    Days per week: Not on file    Minutes per session: Not on file  . Stress: Not on file  Relationships  . Social Herbalist on phone: Not on file    Gets together: Not on file    Attends religious service: Not on file    Active member of club or organization: Not on file    Attends  meetings of clubs or organizations: Not on file    Relationship status: Not on file  Other Topics Concern  . Not on file  Social History Narrative   Regular exercise-no   Caffeine Use-unsure    Outpatient Encounter Medications as of 07/04/2019  Medication Sig  . Ascorbic Acid (VITAMIN C) 500 MG tablet Take 500 mg by mouth daily.    . Calcium-Vitamin D-Vitamin K (CALCIUM + D) [phone] MG-UNT-MCG CHEW Chew 1 tablet by mouth 2 (two) times daily.  . cyclobenzaprine (FLEXERIL) 10 MG tablet TAKE 1 TABLET BY MOUTH EVERY DAY  . desvenlafaxine (PRISTIQ) 100 MG 24 hr tablet Take 100 mg by mouth daily.    . diclofenac sodium (VOLTAREN) 1 % GEL Apply 2 g topically 4 (four) times daily.  . ferrous sulfate 325 (65 FE) MG tablet Take 325 mg by mouth daily with breakfast.  . glucosamine-chondroitin 500-400 MG tablet Take 1 tablet by mouth 2 (two) times  daily.   Javier Docker Oil 300 MG CAPS Take by mouth daily.  Marland Kitchen LORazepam (ATIVAN) 1 MG tablet Take 1 mg by mouth 2 (two) times daily as needed.    . Methylcellulose, Laxative, (CITRUCEL PO) Take 1 tablespoon before breakfast in water  . metoprolol succinate (TOPROL-XL) 50 MG 24 hr tablet TAKE ONE AND 1/2 TABS BY MOUTH IN THE MORNING  . montelukast (SINGULAIR) 10 MG tablet TAKE 1 TABLET BY MOUTH EVERY DAY  . Multiple Vitamins-Minerals (ONE-A-DAY WOMENS 50+ ADVANTAGE) TABS Take 1 tablet by mouth daily.  Marland Kitchen OVER THE COUNTER MEDICATION 2 (two) times daily.   . pantoprazole (PROTONIX) 40 MG tablet TAKE 1 TABLET BY MOUTH EVERY DAY  . Probiotic Product (PROBIOTIC-10 PO) Take by mouth.  . SYNTHROID 75 MCG tablet TAKE 1 TABLET BY MOUTH EVERY DAY   No facility-administered encounter medications on file as of 07/04/2019.     Activities of Daily Living No flowsheet data found.  Patient Care Team: Biagio Borg, MD as PCP - General (Internal Medicine) Everlene Farrier, MD as Attending Physician (Obstetrics and Gynecology) Norma Fredrickson, MD as Attending Physician (Psychiatry) Marygrace Drought, MD as Attending Physician (Ophthalmology)    Assessment:   This is a routine wellness examination for Sue Green.  Exercise Activities and Dietary recommendations    Goals   None     Fall Risk Fall Risk  12/13/2018 11/30/2017 11/26/2016 09/10/2015 08/23/2014  Falls in the past year? 1 No No No No  Number falls in past yr: 1 - - - -  Injury with Fall? 0 - - - -   Is the patient's home free of loose throw rugs in walkways, pet beds, electrical cords, etc?   {Blank single:19197::"yes","no"}      Grab bars in the bathroom? {Blank single:19197::"yes","no"}      Handrails on the stairs?   {Blank single:19197::"yes","no"}      Adequate lighting?   {Blank single:19197::"yes","no"}  Depression Screen PHQ 2/9 Scores 12/13/2018 11/30/2017 11/26/2016 09/10/2015  PHQ - 2 Score 1 0 0 1     Cognitive Function         Immunization History  Administered Date(s) Administered  . Influenza Split 07/05/2011  . Influenza, High Dose Seasonal PF 05/17/2019  . Influenza, Seasonal, Injecte, Preservative Fre 06/03/2013  . Influenza,inj,Quad PF,6+ Mos 07/03/2014, 05/30/2015  . Influenza-Unspecified 07/17/2012, 04/30/2016  . Pneumococcal Conjugate-13 07/31/2013  . Pneumococcal Polysaccharide-23 07/17/2012  . Td 09/20/2000  . Tdap 07/17/2012, 03/25/2017   Screening Tests Health Maintenance  Topic Date Due  .  MAMMOGRAM  08/25/2019  . COLONOSCOPY  08/17/2021  . TETANUS/TDAP  03/26/2027  . INFLUENZA VACCINE  Completed  . DEXA SCAN  Completed  . Hepatitis C Screening  Completed  . PNA vac Low Risk Adult  Completed      Plan:     I have personally reviewed and noted the following in the patient's chart:   . Medical and social history . Use of alcohol, tobacco or illicit drugs  . Current medications and supplements . Functional ability and status . Nutritional status . Physical activity . Advanced directives . List of other physicians . Screenings to include cognitive, depression, and falls . Referrals and appointments  In addition, I have reviewed and discussed with patient certain preventive protocols, quality metrics, and best practice recommendations. A written personalized care plan for preventive services as well as general preventive health recommendations were provided to patient.     Michiel Cowboy, RN  07/03/2019

## 2019-07-04 ENCOUNTER — Ambulatory Visit: Payer: Medicare Other

## 2019-07-29 NOTE — Progress Notes (Signed)
[name] [address] [place], [place] [address] Phone: [phone] Subjective:   I Sue Green am serving as a Education administrator for Dr. Hulan Saas.   CC: right finger pain   [id number]  Sue Green is a 73 y.o. female coming in with complaint of right (3rd) finger pain. Cyst. States the finger doesn't hurt unless she hits it.   Onset- chronic  Location - right 3rd finger  Duration-  Character-no pain just enlarging Aggravating factors-  Reliving factors-  Therapies tried-  Severity-0-10     Past Medical History:  Diagnosis Date  . Allergy   . Anemia, iron deficiency 03/06/2014  . Anxiety   . Arthritis   . Asthma   . Chronic tension headaches    IN PAST  . Depression   . Fibromyalgia   . GERD (gastroesophageal reflux disease)   . Glaucoma     Per pt, she does not have glaucoma.  . Hypertension   . Internal hemorrhoids   . Iron deficiency anemia   . Osteopenia   . Pyloric stenosis   . Scoliosis   . Thyroid disease    hypothyroidism   Past Surgical History:  Procedure Laterality Date  . APPENDECTOMY  1975  . SHOULDER ARTHROSCOPY  2001   rt shoulder  . TONSILLECTOMY AND ADENOIDECTOMY     73 years old   Social History   Socioeconomic History  . Marital status: Divorced    Spouse name: Not on file  . Number of children: Not on file  . Years of education: 65  . Highest education level: Not on file  Occupational History  . Occupation: Retired  Scientific laboratory technician  . Financial resource strain: Not on file  . Food insecurity    Worry: Not on file    Inability: Not on file  . Transportation needs    Medical: Not on file    Non-medical: Not on file  Tobacco Use  . Smoking status: Never Smoker  . Smokeless tobacco: Never Used  Substance and Sexual Activity  . 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 Drug use: No  . Sexual activity: Not on file  Lifestyle  . Physical activity     Days per week: Not on file    Minutes per session: Not on file  . Stress: Not on file  Relationships  . Social Herbalist on phone: Not on file    Gets together: Not on file    Attends religious service: Not on file    Active member of club or organization: Not on file    Attends meetings of clubs or organizations: Not on file    Relationship status: Not on file  Other Topics Concern  . Not on file  Social History Narrative   Regular exercise-no   Caffeine Use-unsure   Allergies  Allergen Reactions  . Penicillins Hives  . Doxycycline Nausea Only  . Antihistamines, Diphenhydramine-Type   . Diphenhydramine   . Latex     BREAK OUT IN RASH   Family History  Problem Relation Age of Onset  . Diabetes Mother   . Arthritis Mother   . Cancer Father   . Diabetes Father   . Colon cancer Neg Hx     Current Outpatient Medications (Endocrine & Metabolic):  .  SYNTHROID 75 MCG tablet, TAKE 1 TABLET BY MOUTH EVERY DAY  Current  Outpatient Medications (Cardiovascular):  .  metoprolol succinate (TOPROL-XL) 50 MG 24 hr tablet, TAKE ONE AND 1/2 TABS BY MOUTH IN THE MORNING  Current Outpatient Medications (Respiratory):  .  montelukast (SINGULAIR) 10 MG tablet, TAKE 1 TABLET BY MOUTH EVERY DAY   Current Outpatient Medications (Hematological):  .  ferrous sulfate 325 (65 FE) MG tablet, Take 325 mg by mouth daily with breakfast.  Current Outpatient Medications (Other):  Marland Kitchen  Ascorbic Acid (VITAMIN C) 500 MG tablet, Take 500 mg by mouth daily.   .  Calcium-Vitamin D-Vitamin K (CALCIUM + D) [phone] MG-UNT-MCG CHEW, Chew 1 tablet by mouth 2 (two) times daily. .  cyclobenzaprine (FLEXERIL) 10 MG tablet, TAKE 1 TABLET BY MOUTH EVERY DAY .  desvenlafaxine (PRISTIQ) 100 MG 24 hr tablet, Take 100 mg by mouth daily.   .  diclofenac sodium (VOLTAREN) 1 % GEL, Apply 2 g topically 4 (four) times daily. Marland Kitchen  glucosamine-chondroitin 500-400 MG tablet, Take 1 tablet by mouth 2 (two)  times daily.  Javier Docker Oil 300 MG CAPS, Take by mouth daily. Marland Kitchen  LORazepam (ATIVAN) 1 MG tablet, Take 1 mg by mouth 2 (two) times daily as needed.   .  Methylcellulose, Laxative, (CITRUCEL PO), Take 1 tablespoon before breakfast in water .  Multiple Vitamins-Minerals (ONE-A-DAY WOMENS 50+ ADVANTAGE) TABS, Take 1 tablet by mouth daily. Marland Kitchen  OVER THE COUNTER MEDICATION, 2 (two) times daily.  .  pantoprazole (PROTONIX) 40 MG tablet, TAKE 1 TABLET BY MOUTH EVERY DAY .  Probiotic Product (PROBIOTIC-10 PO), Take by mouth.    Past medical history, social, surgical and family history all reviewed in electronic medical record.  No pertanent information unless stated regarding to the chief complaint.   Review of Systems:  No headache, visual changes, nausea, vomiting, diarrhea, constipation, dizziness, abdominal pain, skin rash, fevers, chills, night sweats, weight loss, swollen lymph nodes, body aches, joint swelling, muscle aches, chest pain, shortness of breath, mood changes.   Objective  There were no vitals taken for this visit. Systems examined below as of    General: No apparent distress alert and oriented x3 mood and affect normal, dressed appropriately.  HEENT: Pupils equal, extraocular movements intact  Respiratory: Patient's speak in full sentences and does not appear short of breath  Cardiovascular: No lower extremity edema, non tender, no erythema  Skin: Warm dry intact with no signs of infection or rash on extremities or on axial skeleton.  Abdomen: Soft nontender  Neuro: Cranial nerves II through XII are intact, neurovascularly intact in all extremities with 2+ DTRs and 2+ pulses.  Lymph: No lymphadenopathy of posterior or anterior cervical chain or axillae bilaterally.  Gait normal with good balance and coordination.  MSK:  Non tender with full range of motion and good stability and symmetric strength and tone of shoulders, elbows, wrist, hip, knee and ankles bilaterally.  Right hand  exam shows the patient does have a ganglion cyst over the finger ring finger.  Dorsal aspect.  Freely movable, does not approach patient's joint     Impression and Recommendations:     This case required medical decision making of moderate complexity. The above documentation has been reviewed and is accurate and complete Lyndal Pulley, DO       Note: This dictation was prepared with Dragon dictation along with smaller phrase technology. Any transcriptional errors that result from this process are unintentional.

## 2019-07-31 ENCOUNTER — Encounter: Payer: Self-pay | Admitting: Family Medicine

## 2019-07-31 ENCOUNTER — Other Ambulatory Visit: Payer: Self-pay

## 2019-07-31 ENCOUNTER — Ambulatory Visit (INDEPENDENT_AMBULATORY_CARE_PROVIDER_SITE_OTHER): Payer: Medicare Other | Admitting: Family Medicine

## 2019-07-31 DIAGNOSIS — M67441 Ganglion, right hand: Secondary | ICD-10-CM

## 2019-07-31 NOTE — Assessment & Plan Note (Signed)
Ganglion cyst, discussed with patient about the ideas of either leaving it or the possibility of aspiration.  Patient electing to continue with the monitoring and.  Patient will follow-up again if any worsening symptoms or enlargement and if she changes her mind.  As long as patient is doing well with it can follow-up as needed

## 2019-07-31 NOTE — Patient Instructions (Signed)
Ganglion cyst Put a Band-Aid over it  See me again in 5 weeks if you would like it drained

## 2019-11-05 ENCOUNTER — Other Ambulatory Visit: Payer: Self-pay | Admitting: Internal Medicine

## 2019-11-09 ENCOUNTER — Other Ambulatory Visit: Payer: Self-pay | Admitting: *Deleted

## 2019-11-09 MED ORDER — METOPROLOL SUCCINATE ER 50 MG PO TB24: ORAL_TABLET | ORAL | 1 refills | Status: DC

## 2019-11-13 ENCOUNTER — Other Ambulatory Visit: Payer: Self-pay | Admitting: Internal Medicine

## 2019-12-12 ENCOUNTER — Other Ambulatory Visit: Payer: Self-pay | Admitting: Internal Medicine

## 2019-12-12 NOTE — Telephone Encounter (Signed)
Please refill as per office routine med refill policy (all routine meds refilled for 3 mo or monthly per pt preference up to one year from last visit, then month to month grace period for 3 mo, then further med refills will have to be denied)  

## 2019-12-19 ENCOUNTER — Encounter: Payer: Medicare Other | Admitting: Internal Medicine

## 2019-12-29 ENCOUNTER — Other Ambulatory Visit: Payer: Self-pay | Admitting: Internal Medicine

## 2020-01-09 ENCOUNTER — Encounter: Payer: Medicare Other | Admitting: Internal Medicine

## 2020-03-03 ENCOUNTER — Other Ambulatory Visit: Payer: Self-pay | Admitting: Internal Medicine

## 2020-03-03 NOTE — Telephone Encounter (Signed)
Please refill as per office routine med refill policy (all routine meds refilled for 3 mo or monthly per pt preference up to one year from last visit, then month to month grace period for 3 mo, then further med refills will have to be denied)  

## 2020-03-18 ENCOUNTER — Ambulatory Visit: Payer: Medicare Other | Admitting: Gastroenterology

## 2020-03-26 ENCOUNTER — Encounter: Payer: Medicare Other | Admitting: Internal Medicine

## 2020-03-27 ENCOUNTER — Other Ambulatory Visit: Payer: Self-pay | Admitting: Internal Medicine

## 2020-03-27 NOTE — Telephone Encounter (Signed)
Please refill as per office routine med refill policy (all routine meds refilled for 3 mo or monthly per pt preference up to one year from last visit, then month to month grace period for 3 mo, then further med refills will have to be denied)  

## 2020-04-16 ENCOUNTER — Encounter: Payer: Medicare Other | Admitting: Internal Medicine

## 2020-04-17 ENCOUNTER — Ambulatory Visit (INDEPENDENT_AMBULATORY_CARE_PROVIDER_SITE_OTHER): Payer: Medicare Other

## 2020-04-17 ENCOUNTER — Other Ambulatory Visit: Payer: Self-pay

## 2020-04-17 ENCOUNTER — Encounter: Payer: Self-pay | Admitting: Internal Medicine

## 2020-04-17 ENCOUNTER — Ambulatory Visit (INDEPENDENT_AMBULATORY_CARE_PROVIDER_SITE_OTHER): Payer: Medicare Other | Admitting: Internal Medicine

## 2020-04-17 VITALS — BP 130/62 | HR 87 | Temp 98.8°F | Ht 59.0 in | Wt 134.0 lb

## 2020-04-17 DIAGNOSIS — K219 Gastro-esophageal reflux disease without esophagitis: Secondary | ICD-10-CM

## 2020-04-17 DIAGNOSIS — R109 Unspecified abdominal pain: Secondary | ICD-10-CM

## 2020-04-17 DIAGNOSIS — E559 Vitamin D deficiency, unspecified: Secondary | ICD-10-CM

## 2020-04-17 DIAGNOSIS — I10 Essential (primary) hypertension: Secondary | ICD-10-CM

## 2020-04-17 DIAGNOSIS — Z0000 Encounter for general adult medical examination without abnormal findings: Secondary | ICD-10-CM

## 2020-04-17 DIAGNOSIS — E538 Deficiency of other specified B group vitamins: Secondary | ICD-10-CM | POA: Diagnosis not present

## 2020-04-17 DIAGNOSIS — D509 Iron deficiency anemia, unspecified: Secondary | ICD-10-CM | POA: Diagnosis not present

## 2020-04-17 DIAGNOSIS — Z0001 Encounter for general adult medical examination with abnormal findings: Secondary | ICD-10-CM

## 2020-04-17 DIAGNOSIS — K5900 Constipation, unspecified: Secondary | ICD-10-CM

## 2020-04-17 NOTE — Patient Instructions (Signed)
Please continue all other medications as before, and refills have been done if requested.  Please have the pharmacy call with any other refills you may need.  Please continue your efforts at being more active, low cholesterol diet, and weight control.  You are otherwise up to date with prevention measures today.  Please keep your appointments with your specialists as you may have planned  Please go to the XRAY Department in the first floor for the x-ray testing  Please go to the LAB at the blood drawing area for the tests to be done  You will be contacted by phone if any changes need to be made immediately.  Otherwise, you will receive a letter about your results with an explanation, but please check with MyChart first.  Please remember to sign up for MyChart if you have not done so, as this will be important to you in the future with finding out test results, communicating by private email, and scheduling acute appointments online when needed.  Please make an Appointment to return in 6 months, or sooner if needed 

## 2020-04-17 NOTE — Progress Notes (Signed)
Subjective:    Patient ID: Sue Green, female    DOB: 02/11/1946, 74 y.o.   MRN: [id number]  HPI Here for wellness and f/u;  Overall doing ok;  Pt denies Chest pain, worsening SOB, DOE, wheezing, orthopnea, PND, worsening LE edema, palpitations, dizziness or syncope.  Pt denies neurological change such as new headache, facial or extremity weakness.  Pt denies polydipsia, polyuria, or low sugar symptoms. Pt states overall good compliance with treatment and medications, good tolerability, and has been trying to follow appropriate diet.  Pt denies worsening depressive symptoms, suicidal ideation or panic. No fever, night sweats, wt loss, loss of appetite, or other constitutional symptoms.  Pt states good ability with ADL's, has low fall risk, home safety reviewed and adequate, no other significant changes in hearing or vision, and only occasionally active with exercise. Also with c/o left sided abd pain with radiation to the flank x 2 wks, mild intermittent but severe at times, occurred to start after exercise class, worse in the am after making the bed, then no problem the rest of the day. Denies worsening reflux, abd pain, dysphagia, n/v, bowel change or blood.  Denies urinary symptoms such as dysuria, frequency, urgency, flank pain, hematuria or n/v, fever, chills. Past Medical History:  Diagnosis Date  . Allergy   . Anemia, iron deficiency 03/06/2014  . Anxiety   . Arthritis   . Asthma   . Chronic tension headaches    IN PAST  . Depression   . Fibromyalgia   . GERD (gastroesophageal reflux disease)   . Glaucoma     Per pt, she does not have glaucoma.  . Hypertension   . Internal hemorrhoids   . Iron deficiency anemia   . Osteopenia   . Pyloric stenosis   . Scoliosis   . Thyroid disease    hypothyroidism   Past Surgical History:  Procedure Laterality Date  . APPENDECTOMY  1975  . SHOULDER ARTHROSCOPY  2001   rt shoulder  . TONSILLECTOMY AND ADENOIDECTOMY     74 years old     reports that she has never smoked. She has never used smokeless tobacco. She reports that she does not drink alcohol and does not use drugs. family history includes Arthritis in her mother; Cancer in her father; Diabetes in her father and mother. Allergies  Allergen Reactions  . Penicillins Hives  . Doxycycline Nausea Only  . Antihistamines, Diphenhydramine-Type   . Diphenhydramine   . Latex     BREAK OUT IN RASH   Current Outpatient Medications on File Prior to Visit  Medication Sig Dispense Refill  . Ascorbic Acid (VITAMIN C) 500 MG tablet Take 500 mg by mouth daily.      . Calcium-Vitamin D-Vitamin K (CALCIUM + D) [phone] MG-UNT-MCG CHEW Chew 1 tablet by mouth 2 (two) times daily.    . cyclobenzaprine (FLEXERIL) 10 MG tablet TAKE 1 TABLET BY MOUTH EVERY DAY 30 tablet 5  . desvenlafaxine (PRISTIQ) 100 MG 24 hr tablet Take 100 mg by mouth daily.      . diclofenac sodium (VOLTAREN) 1 % GEL Apply 2 g topically 4 (four) times daily. 100 g 5  . ferrous sulfate 325 (65 FE) MG tablet Take 325 mg by mouth daily with breakfast.    . glucosamine-chondroitin 500-400 MG tablet Take 1 tablet by mouth 2 (two) times daily.     . Influenza vac split quadrivalent PF (FLUZONE HIGH-DOSE) 0.5 ML injection Fluzone High-Dose 2019-20 (PF) 180 mcg/0.5 mL  intramuscular syringe  TO BE ADMINISTERED BY PHARMACIST FOR IMMUNIZATION    . Krill Oil 300 MG CAPS Take by mouth daily.    Marland Kitchen LORazepam (ATIVAN) 1 MG tablet Take 1 mg by mouth 2 (two) times daily as needed.      . Methylcellulose, Laxative, (CITRUCEL PO) Take 1 tablespoon before breakfast in water    . metoprolol succinate (TOPROL-XL) 50 MG 24 hr tablet TAKE ONE AND 1/2 TABS BY MOUTH IN THE MORNING 135 tablet 1  . montelukast (SINGULAIR) 10 MG tablet TAKE 1 TABLET BY MOUTH EVERY DAY 90 tablet 1  . Multiple Vitamins-Minerals (ONE-A-DAY WOMENS 50+ ADVANTAGE) TABS Take 1 tablet by mouth daily.    Marland Kitchen OVER THE COUNTER MEDICATION 2 (two) times daily.     .  pantoprazole (PROTONIX) 40 MG tablet Take 1 tablet (40 mg total) by mouth daily. Must keep scheduled appt for future refills 30 tablet 0  . Probiotic Product (PROBIOTIC-10 PO) Take by mouth.    . sodium fluoride (DENTA 5000 PLUS) 1.1 % CREA dental cream Denta 5000 Plus 1.1 % cream  USE AS DIRECTED    . SYNTHROID 75 MCG tablet TAKE 1 TABLET BY MOUTH EVERY DAY 90 tablet 3   No current facility-administered medications on file prior to visit.   Review of Systems All otherwise neg per pt     Objective:   Physical Exam BP (!) 130/62 (BP Location: Left Arm, Patient Position: Sitting, Cuff Size: Large)   Pulse 87   Temp 98.8 F (37.1 C) (Oral)   Ht 4\' 11"  (1.499 m)   Wt 134 lb (60.8 kg)   SpO2 96%   BMI 27.06 kg/m  VS noted,  Constitutional: Pt appears in NAD HENT: Head: NCAT.  Right Ear: External ear normal.  Left Ear: External ear normal.  Eyes: . Pupils are equal, round, and reactive to light. Conjunctivae and EOM are normal Nose: without d/c or deformity Neck: Neck supple. Gross normal ROM Cardiovascular: Normal rate and regular rhythm.   Pulmonary/Chest: Effort normal and breath sounds without rales or wheezing.  Abd:  Soft, NT, ND, + BS, no organomegaly Neurological: Pt is alert. At baseline orientation, motor grossly intact Skin: Skin is warm. No rashes, other new lesions, no LE edema Psychiatric: Pt behavior is normal without agitation  All otherwise neg per pt Lab Results  Component Value Date   WBC 8.1 04/17/2020   HGB 12.4 04/17/2020   HCT 35.4 04/17/2020   PLT 392 04/17/2020   GLUCOSE 95 12/13/2018   CHOL 165 04/17/2020   TRIG 397 (H) 04/17/2020   HDL 35 (L) 04/17/2020   LDLDIRECT 98.0 12/13/2018   LDLCALC 77 04/17/2020   ALT 15 04/17/2020   AST 16 04/17/2020   NA 133 (L) 12/13/2018   K 4.4 12/13/2018   CL 97 12/13/2018   CREATININE 0.66 12/13/2018   BUN 15 12/13/2018   CO2 30 12/13/2018   TSH 0.80 04/17/2020      Assessment & Plan:

## 2020-04-18 ENCOUNTER — Telehealth: Payer: Self-pay

## 2020-04-18 ENCOUNTER — Encounter: Payer: Self-pay | Admitting: Internal Medicine

## 2020-04-18 LAB — LIPID PANEL
Cholesterol: 165 mg/dL (ref <200)
HDL: 35 mg/dL — ABNORMAL LOW (ref >=50)
LDL Cholesterol (Calc): 77 mg/dL (calc)
Non-HDL Cholesterol (Calc): 130 mg/dL (calc) — ABNORMAL HIGH (ref <130)
Total CHOL/HDL Ratio: 4.7 (calc) (ref <5.0)
Triglycerides: 397 mg/dL — ABNORMAL HIGH (ref <150)

## 2020-04-18 LAB — CBC WITH DIFFERENTIAL/PLATELET
Absolute Monocytes: 923 cells/uL (ref 200–950)
Basophils Absolute: 73 cells/uL (ref 0–200)
Basophils Relative: 0.9 %
Eosinophils Absolute: 178 cells/uL (ref 15–500)
Eosinophils Relative: 2.2 %
HCT: 35.4 % (ref 35.0–45.0)
Hemoglobin: 12.4 g/dL (ref 11.7–15.5)
Lymphs Abs: 996 cells/uL (ref 850–3900)
MCH: 34.3 pg — ABNORMAL HIGH (ref 27.0–33.0)
MCHC: 35 g/dL (ref 32.0–36.0)
MCV: 98.1 fL (ref 80.0–100.0)
MPV: 9 fL (ref 7.5–12.5)
Monocytes Relative: 11.4 %
Neutro Abs: 5929 cells/uL (ref 1500–7800)
Neutrophils Relative %: 73.2 %
Platelets: 392 10*3/uL (ref 140–400)
RBC: 3.61 10*6/uL — ABNORMAL LOW (ref 3.80–5.10)
RDW: 12.5 % (ref 11.0–15.0)
Total Lymphocyte: 12.3 %
WBC: 8.1 10*3/uL (ref 3.8–10.8)

## 2020-04-18 LAB — URINALYSIS, ROUTINE W REFLEX MICROSCOPIC
Bilirubin Urine: NEGATIVE
Glucose, UA: NEGATIVE
Hgb urine dipstick: NEGATIVE
Ketones, ur: NEGATIVE
Leukocytes,Ua: NEGATIVE
Nitrite: NEGATIVE
Protein, ur: NEGATIVE
Specific Gravity, Urine: 1.014 (ref 1.001–1.03)
pH: 6.5 (ref 5.0–8.0)

## 2020-04-18 LAB — HEPATIC FUNCTION PANEL
AG Ratio: 1.8 (calc) (ref 1.0–2.5)
ALT: 15 U/L (ref 6–29)
AST: 16 U/L (ref 10–35)
Albumin: 4.4 g/dL (ref 3.6–5.1)
Alkaline phosphatase (APISO): 71 U/L (ref 37–153)
Bilirubin, Direct: 0.1 mg/dL (ref 0.0–0.2)
Globulin: 2.4 g/dL (calc) (ref 1.9–3.7)
Indirect Bilirubin: 0.1 mg/dL (calc) — ABNORMAL LOW (ref 0.2–1.2)
Total Bilirubin: 0.2 mg/dL (ref 0.2–1.2)
Total Protein: 6.8 g/dL (ref 6.1–8.1)

## 2020-04-18 LAB — FERRITIN: Ferritin: 67 ng/mL (ref 16–288)

## 2020-04-18 LAB — TRANSFERRIN: Transferrin: 268 mg/dL (ref 188–341)

## 2020-04-18 LAB — VITAMIN B12: Vitamin B-12: >2000 pg/mL — ABNORMAL HIGH (ref 200–1100)

## 2020-04-18 LAB — TSH: TSH: 0.8 mIU/L (ref 0.40–4.50)

## 2020-04-18 LAB — VITAMIN D 25 HYDROXY (VIT D DEFICIENCY, FRACTURES): Vit D, 25-Hydroxy: 66 ng/mL (ref 30–100)

## 2020-04-18 NOTE — Telephone Encounter (Signed)
New message   Calling for test results asking for a call back today

## 2020-04-20 ENCOUNTER — Encounter: Payer: Self-pay | Admitting: Internal Medicine

## 2020-04-20 NOTE — Assessment & Plan Note (Signed)
stable overall by history and exam, recent data reviewed with pt, and pt to continue medical treatment as before,  to f/u any worsening symptoms or concerns  

## 2020-04-20 NOTE — Assessment & Plan Note (Addendum)
?   Etiology, suspect msk, but for labs and xrays today  I spent 31 minutes in addition to time for CPX wellness examination in preparing to see the patient by review of recent labs, imaging and procedures, obtaining and reviewing separately obtained history, communicating with the patient and family or caregiver, ordering medications, tests or procedures, and documenting clinical information in the EHR including the differential Dx, treatment, and any further evaluation and other management of left sided abd pain, htn, gerd, iron def anemia, constipatoin

## 2020-04-20 NOTE — Assessment & Plan Note (Signed)
Also for iron studies f/u

## 2020-04-21 NOTE — Telephone Encounter (Signed)
    Patient calling to discuss results

## 2020-04-24 NOTE — Telephone Encounter (Signed)
Tried calling pt. Pt phone just rang out.

## 2020-04-30 ENCOUNTER — Other Ambulatory Visit: Payer: Self-pay | Admitting: Internal Medicine

## 2020-04-30 NOTE — Telephone Encounter (Signed)
Please refill as per office routine med refill policy (all routine meds refilled for 3 mo or monthly per pt preference up to one year from last visit, then month to month grace period for 3 mo, then further med refills will have to be denied)  

## 2020-05-10 ENCOUNTER — Other Ambulatory Visit: Payer: Self-pay | Admitting: Internal Medicine

## 2020-05-10 NOTE — Telephone Encounter (Signed)
Please refill as per office routine med refill policy (all routine meds refilled for 3 mo or monthly per pt preference up to one year from last visit, then month to month grace period for 3 mo, then further med refills will have to be denied)  

## 2020-05-11 ENCOUNTER — Other Ambulatory Visit: Payer: Self-pay | Admitting: Internal Medicine

## 2020-05-11 NOTE — Telephone Encounter (Signed)
Please refill as per office routine med refill policy (all routine meds refilled for 3 mo or monthly per pt preference up to one year from last visit, then month to month grace period for 3 mo, then further med refills will have to be denied)  

## 2020-05-14 ENCOUNTER — Encounter: Payer: Medicare Other | Admitting: Internal Medicine

## 2020-06-05 ENCOUNTER — Ambulatory Visit (INDEPENDENT_AMBULATORY_CARE_PROVIDER_SITE_OTHER): Payer: Medicare Other | Admitting: Internal Medicine

## 2020-06-05 ENCOUNTER — Other Ambulatory Visit: Payer: Self-pay

## 2020-06-05 ENCOUNTER — Encounter: Payer: Self-pay | Admitting: Internal Medicine

## 2020-06-05 VITALS — BP 120/70 | HR 94 | Temp 98.0°F | Ht 59.0 in | Wt 135.2 lb

## 2020-06-05 DIAGNOSIS — F329 Major depressive disorder, single episode, unspecified: Secondary | ICD-10-CM

## 2020-06-05 DIAGNOSIS — F419 Anxiety disorder, unspecified: Secondary | ICD-10-CM | POA: Diagnosis not present

## 2020-06-05 DIAGNOSIS — E039 Hypothyroidism, unspecified: Secondary | ICD-10-CM | POA: Diagnosis not present

## 2020-06-05 DIAGNOSIS — I10 Essential (primary) hypertension: Secondary | ICD-10-CM | POA: Diagnosis not present

## 2020-06-05 DIAGNOSIS — F32A Depression, unspecified: Secondary | ICD-10-CM

## 2020-06-05 DIAGNOSIS — Z23 Encounter for immunization: Secondary | ICD-10-CM | POA: Diagnosis not present

## 2020-06-05 NOTE — Assessment & Plan Note (Signed)
stable overall by history and exam, recent data reviewed with pt, and pt to continue medical treatment as before,  to f/u any worsening symptoms or concerns  

## 2020-06-05 NOTE — Assessment & Plan Note (Addendum)

## 2020-06-05 NOTE — Progress Notes (Addendum)
Subjective:    Patient ID: Sue Green, female    DOB: 1946/07/10, 74 y.o.   MRN: [id number]  HPI  Here to f/u; overall doing ok,  Pt denies chest pain, increasing sob or doe, wheezing, orthopnea, PND, increased LE swelling, palpitations, dizziness or syncope.  Pt denies new neurological symptoms such as new headache, or facial or extremity weakness or numbness.  Pt denies polydipsia, polyuria, or low sugar episode.  Pt states overall good compliance with meds, mostly trying to follow appropriate diet, with wt overall stable,  but little exercise however.  Denies hyper or hypo thyroid symptoms such as voice, skin or hair change.  Denies worsening depressive symptoms, suicidal ideation, or panic Past Medical History:  Diagnosis Date  . Allergy   . Anemia, iron deficiency 03/06/2014  . Anxiety   . Arthritis   . Asthma   . Chronic tension headaches    IN PAST  . Depression   . Fibromyalgia   . GERD (gastroesophageal reflux disease)   . Glaucoma     Per pt, she does not have glaucoma.  . Hypertension   . Internal hemorrhoids   . Iron deficiency anemia   . Osteopenia   . Pyloric stenosis   . Scoliosis   . Thyroid disease    hypothyroidism   Past Surgical History:  Procedure Laterality Date  . APPENDECTOMY  1975  . SHOULDER ARTHROSCOPY  2001   rt shoulder  . TONSILLECTOMY AND ADENOIDECTOMY     74 years old    reports that she has never smoked. She has never used smokeless tobacco. She reports that she does not drink alcohol and does not use drugs. family history includes Arthritis in her mother; Cancer in her father; Diabetes in her father and mother. Allergies  Allergen Reactions  . Penicillins Hives  . Doxycycline Nausea Only  . Antihistamines, Diphenhydramine-Type   . Diphenhydramine   . Latex     BREAK OUT IN RASH   Current Outpatient Medications on File Prior to Visit  Medication Sig Dispense Refill  . Ascorbic Acid (VITAMIN C) 500 MG tablet Take 500 mg by mouth  daily.      . Calcium-Vitamin D-Vitamin K (CALCIUM + D) [phone] MG-UNT-MCG CHEW Chew 1 tablet by mouth 2 (two) times daily.    . cyclobenzaprine (FLEXERIL) 10 MG tablet TAKE 1 TABLET BY MOUTH EVERY DAY 30 tablet 5  . desvenlafaxine (PRISTIQ) 100 MG 24 hr tablet Take 100 mg by mouth daily.      . diclofenac sodium (VOLTAREN) 1 % GEL Apply 2 g topically 4 (four) times daily. 100 g 5  . ferrous sulfate 325 (65 FE) MG tablet Take 325 mg by mouth daily with breakfast.    . glucosamine-chondroitin 500-400 MG tablet Take 1 tablet by mouth 2 (two) times daily.     Javier Docker Oil 300 MG CAPS Take by mouth daily.    Marland Kitchen LORazepam (ATIVAN) 1 MG tablet Take 1 mg by mouth 2 (two) times daily as needed.      . Methylcellulose, Laxative, (CITRUCEL PO) Take 1 tablespoon before breakfast in water    . montelukast (SINGULAIR) 10 MG tablet TAKE 1 TABLET BY MOUTH EVERY DAY 90 tablet 1  . Multiple Vitamins-Minerals (ONE-A-DAY WOMENS 50+ ADVANTAGE) TABS Take 1 tablet by mouth daily.    Marland Kitchen OVER THE COUNTER MEDICATION 2 (two) times daily.     . pantoprazole (PROTONIX) 40 MG tablet TAKE 1 TABLET (40 MG TOTAL) BY MOUTH  DAILY. MUST KEEP SCHEDULED APPT FOR FUTURE REFILLS 30 tablet 0  . Probiotic Product (PROBIOTIC-10 PO) Take by mouth.    . SYNTHROID 75 MCG tablet TAKE 1 TABLET BY MOUTH EVERY DAY 90 tablet 3  . Influenza vac split quadrivalent PF (FLUZONE HIGH-DOSE) 0.5 ML injection Fluzone High-Dose 2019-20 (PF) 180 mcg/0.5 mL intramuscular syringe  TO BE ADMINISTERED BY PHARMACIST FOR IMMUNIZATION (Patient not taking: Reported on 06/05/2020)    . metoprolol succinate (TOPROL-XL) 50 MG 24 hr tablet TAKE ONE AND 1/2 TABS BY MOUTH IN THE MORNING (Patient not taking: Reported on 06/05/2020) 135 tablet 1  . sodium fluoride (DENTA 5000 PLUS) 1.1 % CREA dental cream Denta 5000 Plus 1.1 % cream  USE AS DIRECTED (Patient not taking: Reported on 06/05/2020)     No current facility-administered medications on file prior to visit.    Review of Systems All otherwise neg per pt    Objective:   Physical Exam BP 120/70 (BP Location: Right Arm, Patient Position: Sitting, Cuff Size: Normal)   Pulse 94   Temp 98 F (36.7 C) (Oral)   Ht 4\' 11"  (1.499 m)   Wt 135 lb 3.2 oz (61.3 kg)   SpO2 98%   BMI 27.31 kg/m  VS noted,  Constitutional: Pt appears in NAD HENT: Head: NCAT.  Right Ear: External ear normal.  Left Ear: External ear normal.  Eyes: . Pupils are equal, round, and reactive to light. Conjunctivae and EOM are normal Nose: without d/c or deformity Neck: Neck supple. Gross normal ROM Cardiovascular: Normal rate and regular rhythm.   Pulmonary/Chest: Effort normal and breath sounds without rales or wheezing.  Abd:  Soft, NT, ND, + BS, no organomegaly Neurological: Pt is alert. At baseline orientation, motor grossly intact Skin: Skin is warm. No rashes, other new lesions, no LE edema Psychiatric: Pt behavior is normal without agitation  All otherwise neg per pt Lab Results  Component Value Date   WBC 8.1 04/17/2020   HGB 12.4 04/17/2020   HCT 35.4 04/17/2020   PLT 392 04/17/2020   GLUCOSE 95 12/13/2018   CHOL 165 04/17/2020   TRIG 397 (H) 04/17/2020   HDL 35 (L) 04/17/2020   LDLDIRECT 98.0 12/13/2018   LDLCALC 77 04/17/2020   ALT 15 04/17/2020   AST 16 04/17/2020   NA 133 (L) 12/13/2018   K 4.4 12/13/2018   CL 97 12/13/2018   CREATININE 0.66 12/13/2018   BUN 15 12/13/2018   CO2 30 12/13/2018   TSH 0.80 04/17/2020      Assessment & Plan:

## 2020-06-05 NOTE — Patient Instructions (Addendum)
You had the flu shot today  Please continue all other medications as before, and refills have been done if requested.  Please have the pharmacy call with any other refills you may need.  Please continue your efforts at being more active, low cholesterol diet, and weight control.  You are otherwise up to date with prevention measures today.  Please keep your appointments with your specialists as you may have planned  Please make an Appointment to return in 6 months, or sooner if needed 

## 2020-06-06 ENCOUNTER — Other Ambulatory Visit: Payer: Self-pay | Admitting: Internal Medicine

## 2020-06-09 ENCOUNTER — Other Ambulatory Visit: Payer: Self-pay | Admitting: Internal Medicine

## 2020-06-09 NOTE — Telephone Encounter (Signed)
Sent to Dr. John. 

## 2020-07-04 ENCOUNTER — Other Ambulatory Visit: Payer: Self-pay | Admitting: Internal Medicine

## 2020-07-04 NOTE — Telephone Encounter (Signed)
Please refill as per office routine med refill policy (all routine meds refilled for 3 mo or monthly per pt preference up to one year from last visit, then month to month grace period for 3 mo, then further med refills will have to be denied)  

## 2020-07-25 ENCOUNTER — Encounter: Payer: Self-pay | Admitting: Physician Assistant

## 2020-08-03 ENCOUNTER — Other Ambulatory Visit: Payer: Self-pay | Admitting: Internal Medicine

## 2020-08-03 NOTE — Telephone Encounter (Signed)
Please refill as per office routine med refill policy (all routine meds refilled for 3 mo or monthly per pt preference up to one year from last visit, then month to month grace period for 3 mo, then further med refills will have to be denied)  

## 2020-08-11 ENCOUNTER — Ambulatory Visit: Payer: Medicare Other | Admitting: Physician Assistant

## 2020-08-12 ENCOUNTER — Telehealth: Payer: Self-pay | Admitting: Physician Assistant

## 2020-08-12 ENCOUNTER — Encounter: Payer: Self-pay | Admitting: Physician Assistant

## 2020-08-12 ENCOUNTER — Ambulatory Visit (INDEPENDENT_AMBULATORY_CARE_PROVIDER_SITE_OTHER): Payer: Medicare Other | Admitting: Physician Assistant

## 2020-08-12 VITALS — BP 120/68 | HR 94 | Ht 59.0 in | Wt 137.0 lb

## 2020-08-12 DIAGNOSIS — R234 Changes in skin texture: Secondary | ICD-10-CM

## 2020-08-12 DIAGNOSIS — K602 Anal fissure, unspecified: Secondary | ICD-10-CM | POA: Diagnosis not present

## 2020-08-12 MED ORDER — AMBULATORY NON FORMULARY MEDICATION: 1 refills | Status: DC

## 2020-08-12 NOTE — Progress Notes (Signed)
Reviewed and agree with management plan.  Eudell Mcphee T. Maraya Gwilliam, MD FACG [facility]  

## 2020-08-12 NOTE — Progress Notes (Signed)
Chief Complaint: "Spot on my rectum"  HPI:    Sue Green is a 74 year old female with a past medical history as listed below, known to Dr. Fuller Plan, who presents to clinic today with a complaint of "a spot on my rectum".    08/18/2011 colonoscopy was normal.  Repeat recommended in 10 years.    04/01/2017 patient seen in clinic for abdominal pain and diarrhea.  At that time and had acute onset of nausea vomiting abdominal pain and diarrhea after eating a subsandwich, but these were getting better.  It was suspected the symptoms were viral and stool studies were ordered as well as hyoscyamine given.    Today, the patient presents to clinic and explains that she has been having some "tears" on her bottom over the past 6 to 12 months and typically she will just apply Neosporin to these and place a Band-Aid there until it is healed and it goes away, she has been seeing some bright red blood with a bowel movement recently though and tells me when she wipes the area there is some discomfort.  Tells me typically she uses MiraLAX and Citrucel on a daily basis and her stools are formed and sometimes a little large in diameter.    Patient does tell me that she has 2 cats at home that only she can care for and she will not be hospitalized.    Denies fever, chills or weight loss.  Past Medical History:  Diagnosis Date  . Allergy   . Anemia, iron deficiency 03/06/2014  . Anxiety   . Arthritis   . Asthma   . Chronic tension headaches    IN PAST  . Depression   . Fibromyalgia   . GERD (gastroesophageal reflux disease)   . Glaucoma     Per pt, she does not have glaucoma.  . Hypertension   . Internal hemorrhoids   . Iron deficiency anemia   . Osteopenia   . Pyloric stenosis   . Scoliosis   . Thyroid disease    hypothyroidism    Past Surgical History:  Procedure Laterality Date  . APPENDECTOMY  1975  . SHOULDER ARTHROSCOPY  2001   rt shoulder  . TONSILLECTOMY AND ADENOIDECTOMY     74 years old      Current Outpatient Medications  Medication Sig Dispense Refill  . Ascorbic Acid (VITAMIN C) 500 MG tablet Take 500 mg by mouth daily.      . Calcium-Vitamin D-Vitamin K (CALCIUM + D) [phone] MG-UNT-MCG CHEW Chew 1 tablet by mouth 2 (two) times daily.    . cyclobenzaprine (FLEXERIL) 10 MG tablet TAKE 1 TABLET BY MOUTH EVERY DAY *NON FORMULARY 30 tablet 5  . desvenlafaxine (PRISTIQ) 100 MG 24 hr tablet Take 100 mg by mouth daily.      . diclofenac sodium (VOLTAREN) 1 % GEL Apply 2 g topically 4 (four) times daily. 100 g 5  . ferrous sulfate 325 (65 FE) MG tablet Take 325 mg by mouth daily with breakfast.    . glucosamine-chondroitin 500-400 MG tablet Take 1 tablet by mouth 2 (two) times daily.     . Influenza vac split quadrivalent PF (FLUZONE HIGH-DOSE) 0.5 ML injection Fluzone High-Dose 2019-20 (PF) 180 mcg/0.5 mL intramuscular syringe  TO BE ADMINISTERED BY PHARMACIST FOR IMMUNIZATION (Patient not taking: Reported on 06/05/2020)    . Krill Oil 300 MG CAPS Take by mouth daily.    Marland Kitchen LORazepam (ATIVAN) 1 MG tablet Take 1 mg by mouth 2 (  two) times daily as needed.      . Methylcellulose, Laxative, (CITRUCEL PO) Take 1 tablespoon before breakfast in water    . metoprolol succinate (TOPROL-XL) 50 MG 24 hr tablet TAKE ONE AND 1/2 TABS BY MOUTH IN THE MORNING (Patient not taking: Reported on 06/05/2020) 135 tablet 1  . montelukast (SINGULAIR) 10 MG tablet TAKE 1 TABLET BY MOUTH EVERY DAY 90 tablet 1  . Multiple Vitamins-Minerals (ONE-A-DAY WOMENS 50+ ADVANTAGE) TABS Take 1 tablet by mouth daily.    Marland Kitchen OVER THE COUNTER MEDICATION 2 (two) times daily.     . pantoprazole (PROTONIX) 40 MG tablet TAKE 1 TABLET (40 MG TOTAL) BY MOUTH DAILY. MUST KEEP SCHEDULED APPT FOR FUTURE REFILLS 30 tablet 0  . Probiotic Product (PROBIOTIC-10 PO) Take by mouth.    . sodium fluoride (DENTA 5000 PLUS) 1.1 % CREA dental cream Denta 5000 Plus 1.1 % cream  USE AS DIRECTED (Patient not taking: Reported on 06/05/2020)     . SYNTHROID 75 MCG tablet TAKE 1 TABLET BY MOUTH EVERY DAY 90 tablet 3   No current facility-administered medications for this visit.    Allergies as of 08/12/2020 - Review Complete 06/05/2020  Allergen Reaction Noted  . Penicillins Hives 12/29/2010  . Doxycycline Nausea Only 12/29/2010  . Antihistamines, diphenhydramine-type  02/10/2016  . Diphenhydramine  08/18/2011  . Latex  09/04/2013    Family History  Problem Relation Age of Onset  . Diabetes Mother   . Arthritis Mother   . Cancer Father   . Diabetes Father   . Colon cancer Neg Hx     Social History   Socioeconomic History  . Marital status: Divorced    Spouse name: Not on file  . Number of children: Not on file  . Years of education: 77  . Highest education level: Not on file  Occupational History  . Occupation: Retired  Tobacco Use  . Smoking status: Never Smoker  . Smokeless tobacco: Never Used  Substance and Sexual Activity  . 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 Drug use: No  . Sexual activity: Not on file  Other Topics Concern  . Not on file  Social History Narrative   Regular exercise-no   Caffeine Use-unsure   Social Determinants of Health   Financial Resource Strain:   . Difficulty of Paying Living Expenses: Not on file  Food Insecurity:   . Worried About Charity fundraiser in the Last Year: Not on file  . Ran Out of Food in the Last Year: Not on file  Transportation Needs:   . Lack of Transportation (Medical): Not on file  . Lack of Transportation (Non-Medical): Not on file  Physical Activity:   . Days of Exercise per Week: Not on file  . Minutes of Exercise per Session: Not on file  Stress:   . Feeling of Stress : Not on file  Social Connections:   . Frequency of Communication with Friends and Family: Not on file  . Frequency of Social Gatherings with Friends and Family: Not on file  . Attends Religious Services: Not on file  .  Active Member of Clubs or Organizations: Not on file  . Attends Archivist Meetings: Not on file  . Marital Status: Not on file  Intimate Partner Violence:   . Fear of Current or Ex-Partner: Not on file  . Emotionally Abused: Not on file  . Physically  Abused: Not on file  . Sexually Abused: Not on file    Review of Systems:    Constitutional: No weight loss, fever or chills Skin: No rash  Cardiovascular: No chest pain Respiratory: No SOB Gastrointestinal: See HPI and otherwise negative Genitourinary: No dysuria or change in urinary frequency Neurological: No headache, dizziness or syncope Musculoskeletal: No new muscle or joint pain Hematologic: No bruising Psychiatric: No history of depression or anxiety   Physical Exam:  Vital signs: BP 120/68   Pulse 94   Ht 4\' 11"  (1.499 m)   Wt 137 lb (62.1 kg)   BMI 27.67 kg/m   Constitutional:   Pleasant Caucasian female appears to be in NAD, Well developed, Well nourished, alert and cooperative Head:  Normocephalic and atraumatic. Eyes:   PEERL, EOMI. No icterus. Conjunctiva pink. Ears:  Normal auditory acuity. Neck:  Supple Throat: Oral cavity and pharynx without inflammation, swelling or lesion.  Respiratory: Respirations even and unlabored. Lungs clear to auscultation bilaterally.   No wheezes, crackles, or rhonchi.  Cardiovascular: Normal S1, S2. No MRG. Regular rate and rhythm. No peripheral edema, cyanosis or pallor.  Gastrointestinal:  Soft, nondistended, nontender. No rebound or guarding. Normal bowel sounds. No appreciable masses or hepatomegaly. Rectal:  External: dermal tear about 1" above rectal open posteriorly in the gluteal cleft, Also posterior fissure, ttp, tight sphincter; Internal: not done due to discomfort Msk:  Symmetrical without gross deformities. Without edema, no deformity or joint abnormality.  Neurologic:  Alert and  oriented x4;  grossly normal neurologically.  Skin:   Dry and intact without  significant lesions or rashes. Psychiatric: Demonstrates good judgement and reason without abnormal affect or behaviors.  No recent labs/imaging.  Assessment: 1.  Anal fissure: Seen at time of exam today, likely source of bleeding and some discomfort 2.  Rectal bleeding 3.  Laceration in the gluteal cleft: Uncertain etiology, discussed the possibility of yeast in this area versus dermatitis versus other  Plan: 1.  Discussed with patient that she has 2 separate lesions on her bottom, I believe the one that she has been seeing and treating is the one about an inch from the rectal opening.  Explained that this could be due to moisture in the area and dermatitis which has caused a tear/fissure in her skin, would recommend that she is okay to wipe with a wet cloth but needs to make sure she is drying afterwards and is soft with wiping. 2.  As far as the anal fissure, recommend that she apply Nitroglycerin ointment 3 times daily to this area, described application is just a small amount on the tip of her pinky finger finger inserted to the first joint.  Explained that she will need to pick this up at [facility]. 3.  Recommend that she add in a stool softener, Dulcolax once daily to her regimen of MiraLAX and Metamucil. 4.  Explained that if the tear above her rectum does not go away in the future then may need to see a dermatologist in regards to this. 5.  Patient to follow in clinic with me in 8 weeks.  Ellouise Newer, PA-C [facility] 08/12/2020, 2:40 PM  Cc: Biagio Borg, MD

## 2020-08-12 NOTE — Patient Instructions (Addendum)
If you are age 74 or older, your body mass index should be between 23-30. Your Body mass index is 27.67 kg/m. If this is out of the aforementioned range listed, please consider follow up with your Primary Care Provider.  If you are age 47 or younger, your body mass index should be between 19-25. Your Body mass index is 27.67 kg/m. If this is out of the aformentioned range listed, please consider follow up with your Primary Care Provider.    We have sent a prescription for nitroglycerin 0.125% gel to [facility]. You should apply a pea size amount to your rectum three times daily x 6-8 weeks.  [facility]'s information is below: Address: [address], [place], [place] [address]  [phone]  *Please DO NOT go directly from our office to pick up this medication! Give the pharmacy 1 day to process the prescription as this is compounded and takes time to make.  Start Ducolax daily.   How to Take a [employer] A sitz bath is a warm water bath that may be used to care for your rectum, genital area, or the area between your rectum and genitals (perineum). For a sitz bath, the water only comes up to your hips and covers your buttocks. A sitz bath may done at home in a bathtub or with a portable sitz bath that fits over the toilet. Your health care provider may recommend a sitz bath to help:  Relieve pain and discomfort after delivering a baby.  Relieve pain and itching from hemorrhoids or anal fissures.  Relieve pain after certain surgeries.  Relax muscles that are sore or tight. How to take a sitz bath Take 3-4 sitz baths a day, or as many as told by your health care provider. Bathtub sitz bath To take a sitz bath in a bathtub: 1. Partially fill a bathtub with warm water. The water should be deep enough to cover your hips and buttocks when you are sitting in the tub. 2. If your health care provider told you to put medicine in the water, follow his or her  instructions. 3. Sit in the water. 4. Open the tub drain a little, and leave it open during your bath. 5. Turn on the warm water again, enough to replace the water that is draining out. Keep the water running throughout your bath. This helps keep the water at the right level and the right temperature. 6. Soak in the water for 15-20 minutes, or as long as told by your health care provider. 7. When you are done, be careful when you stand up. You may feel dizzy. 8. After the sitz bath, pat yourself dry. Do not rub your skin to dry it.  Over-the-toilet sitz bath To take a sitz bath with an over-the-toilet basin: 1. Follow the manufacturer's instructions. 2. Fill the basin with warm water. 3. If your health care provider told you to put medicine in the water, follow his or her instructions. 4. Sit on the seat. Make sure the water covers your buttocks and perineum. 5. Soak in the water for 15-20 minutes, or as long as told by your health care provider. 6. After the sitz bath, pat yourself dry. Do not rub your skin to dry it. 7. Clean and dry the basin between uses. 8. Discard the basin if it cracks, or according to the manufacturer's instructions. Contact a health care provider if:  Your symptoms get worse. Do not continue with sitz baths if your  symptoms get worse.  You have new symptoms. If this happens, do not continue with sitz baths until you talk with your health care provider. Summary  A sitz bath is a warm water bath in which the water only comes up to your hips and covers your buttocks.  A sitz bath may help relieve itching, relieve pain, and relax muscles that are sore or tight in the lower part of your body, including your genital area.  Take 3-4 sitz baths a day, or as many as told by your health care provider. Soak in the water for 15-20 minutes.  Do not continue with sitz baths if your symptoms get worse. This information is not intended to replace advice given to you by your  health care provider. Make sure you discuss any questions you have with your health care provider. Document Revised: 02/05/2019 Document Reviewed: 09/08/2017 Elsevier Patient Education  [place].

## 2020-08-12 NOTE — Telephone Encounter (Signed)
The pt wanted to know if she should take her medication prior to her office visit today.  She is worried that if she has a BM she will not be clean if Anderson Malta has to examine her.  I advised her to do what is comfortable for her and not to be concerned about being clean enough to examine. She has been advised to proceed in whatever way makes her more comfortable. She will come in for appt today.

## 2020-08-31 ENCOUNTER — Other Ambulatory Visit: Payer: Self-pay | Admitting: Internal Medicine

## 2020-09-01 NOTE — Telephone Encounter (Signed)
Please refill as per office routine med refill policy (all routine meds refilled for 3 mo or monthly per pt preference up to one year from last visit, then month to month grace period for 3 mo, then further med refills will have to be denied)  

## 2020-09-10 ENCOUNTER — Ambulatory Visit: Payer: Medicare Other | Admitting: Gastroenterology

## 2020-10-01 ENCOUNTER — Other Ambulatory Visit: Payer: Self-pay | Admitting: Internal Medicine

## 2020-10-01 NOTE — Telephone Encounter (Signed)
Please refill as per office routine med refill policy (all routine meds refilled for 3 mo or monthly per pt preference up to one year from last visit, then month to month grace period for 3 mo, then further med refills will have to be denied)  

## 2020-10-15 ENCOUNTER — Ambulatory Visit: Payer: Self-pay | Admitting: Physician Assistant

## 2020-10-22 ENCOUNTER — Other Ambulatory Visit: Payer: Self-pay | Admitting: Internal Medicine

## 2020-10-22 ENCOUNTER — Ambulatory Visit: Payer: Medicare Other | Admitting: Internal Medicine

## 2020-10-22 NOTE — Telephone Encounter (Signed)
Please refill as per office routine med refill policy (all routine meds refilled for 3 mo or monthly per pt preference up to one year from last visit, then month to month grace period for 3 mo, then further med refills will have to be denied)  

## 2020-10-30 ENCOUNTER — Other Ambulatory Visit: Payer: Self-pay | Admitting: Internal Medicine

## 2020-10-30 NOTE — Telephone Encounter (Signed)
Please refill as per office routine med refill policy (all routine meds refilled for 3 mo or monthly per pt preference up to one year from last visit, then month to month grace period for 3 mo, then further med refills will have to be denied)  

## 2020-11-30 ENCOUNTER — Other Ambulatory Visit: Payer: Self-pay | Admitting: Internal Medicine

## 2020-11-30 NOTE — Telephone Encounter (Signed)
Please refill as per office routine med refill policy (all routine meds refilled for 3 mo or monthly per pt preference up to one year from last visit, then month to month grace period for 3 mo, then further med refills will have to be denied)  

## 2020-12-12 ENCOUNTER — Encounter (HOSPITAL_COMMUNITY): Payer: Self-pay | Admitting: Emergency Medicine

## 2020-12-12 ENCOUNTER — Other Ambulatory Visit: Payer: Self-pay

## 2020-12-12 ENCOUNTER — Ambulatory Visit (HOSPITAL_COMMUNITY)
Admission: EM | Admit: 2020-12-12 | Discharge: 2020-12-12 | Disposition: A | Discharge status: Home / Self Care | Payer: Medicare Other | Attending: Medical Oncology | Admitting: Medical Oncology

## 2020-12-12 DIAGNOSIS — W5501XA Bitten by cat, initial encounter: Secondary | ICD-10-CM

## 2020-12-12 DIAGNOSIS — S61431A Puncture wound without foreign body of right hand, initial encounter: Secondary | ICD-10-CM

## 2020-12-12 MED ORDER — SULFAMETHOXAZOLE-TRIMETHOPRIM 800-160 MG PO TABS: 1.0000 | ORAL_TABLET | Freq: Two times a day (BID) | ORAL | 0 refills | Status: AC

## 2020-12-12 MED ORDER — METRONIDAZOLE 500 MG PO TABS: 500.0000 mg | ORAL_TABLET | Freq: Three times a day (TID) | ORAL | 0 refills | Status: AC

## 2020-12-12 NOTE — ED Provider Notes (Addendum)
[name]    CSN: [id number] Arrival date & time: 12/12/20  1624      History   Chief Complaint Chief Complaint  Patient presents with  . Animal Bite    HPI Sue Green is a 75 y.o. female.   HPI  Cat Bite: Patient does not appear to be having the best day.  She voices frustration that she has waited to be seen today.  When I walked in to greet patient she stated that she did not need to be here anymore because "apparently I do not need a tetanus shot anymore".  I asked her what she meant regarding this.  She states that her tetanus vaccine card had misled her and that our staff had informed her that she in fact did not need a Tdap vaccine for today's concern.  Her Tdap card was completed in 2012 and states that she may need a booster shot at the 5-year mark.  It then has a date written down from 2018 in which she states she was given a booster Tdap.  She continuously asked questions regarding the timeframe of her Tdap vaccine emboli her card was incorrect and not in a line with my recommendation for 10 years following her last booster shot.  Once we moved past this she stated that her cat bit her right hand today.  She states that this is her cat who is up-to-date on its rabies vaccines.  She is not having any pain, discharge or bleeding from the area.  No history of MRSA or recent fevers.  She has cleansed the wound and has not applied anything.  She expresses frustration that she has had to wait when she does not "need a tetanus shot".  Past Medical History:  Diagnosis Date  . Allergy   . Anemia, iron deficiency 03/06/2014  . Anxiety   . Arthritis   . Asthma   . Chronic tension headaches    IN PAST  . Depression   . Fibromyalgia   . GERD (gastroesophageal reflux disease)   . Glaucoma     Per pt, she does not have glaucoma.  . Hypertension   . Internal hemorrhoids   . Iron deficiency anemia   . Osteopenia   . Pyloric stenosis   . Scoliosis   . Thyroid  disease    hypothyroidism    Patient Active Problem List   Diagnosis Date Noted  . Left sided abdominal pain 04/17/2020  . Ganglion cyst of joint of finger of right hand 06/08/2019  . Carotid stenosis 11/26/2016  . Acute upper respiratory infection 10/05/2016  . Dysuria 10/05/2016  . Memory loss 08/23/2014  . Acute sinus infection 08/23/2014  . Anemia, iron deficiency 03/06/2014  . Encounter for well adult exam with abnormal findings 07/31/2013  . Urinary urgency 07/17/2012  . Chronic cough 07/17/2012  . Hypertension 07/26/2011  . Anxiety and depression 07/26/2011  . Constipation 07/26/2011  . GE reflux 07/26/2011  . Osteopenia 07/26/2011  . Asthma 07/26/2011  . Tension headache 07/26/2011  . Hypothyroidism 07/26/2011  . History of alcohol abuse 07/26/2011  . Allergic rhinitis 07/26/2011  . Fibromyalgia 07/26/2011    Past Surgical History:  Procedure Laterality Date  . APPENDECTOMY  1975  . SHOULDER ARTHROSCOPY  2001   rt shoulder  . TONSILLECTOMY AND ADENOIDECTOMY     75 years old    OB History   No obstetric history on file.      Home Medications  Prior to Admission medications   Medication Sig Start Date End Date Taking? Authorizing Provider  metroNIDAZOLE (FLAGYL) 500 MG tablet Take 1 tablet (500 mg total) by mouth 3 (three) times daily for 7 days. 12/12/20 12/19/20 Yes Covington, Holli Humbles, PA-C  sulfamethoxazole-trimethoprim (BACTRIM DS) 800-160 MG tablet Take 1 tablet by mouth 2 (two) times daily for 7 days. 12/12/20 12/19/20 Yes Covington, Sarah M, PA-C  Acetaminophen (TYLENOL ARTHRITIS PAIN PO) Take by mouth.    [provider]  AMBULATORY NON FORMULARY MEDICATION Nitroglycerine ointment 0.125 %  Apply a pea sized amount internally and on lesion three times daily for 6 weeks 08/12/20   Levin Erp, PA  Ascorbic Acid (VITAMIN C) 500 MG tablet Take 500 mg by mouth daily.      [provider]  Calcium-Vitamin D-Vitamin K (CALCIUM + D)  [phone] MG-UNT-MCG CHEW Chew 1 tablet by mouth 2 (two) times daily.    [provider]  cyclobenzaprine (FLEXERIL) 10 MG tablet TAKE 1 TABLET BY MOUTH EVERY DAY *NON FORMULARY 06/09/20   Biagio Borg, MD  desvenlafaxine (PRISTIQ) 100 MG 24 hr tablet Take 100 mg by mouth daily.      [provider]  diclofenac sodium (VOLTAREN) 1 % GEL Apply 2 g topically 4 (four) times daily. Patient not taking: Reported on 08/12/2020 06/08/19   Biagio Borg, MD  Ferrous Sulfate (IRON) 28 MG TABS Take by mouth.    [provider]  glucosamine-chondroitin 500-400 MG tablet Take 1 tablet by mouth 2 (two) times daily.     [provider]  Astrid Drafts 300 MG CAPS Take by mouth daily.    [provider]  LORazepam (ATIVAN) 1 MG tablet Take 1 mg by mouth 2 (two) times daily as needed.      [provider]  Methylcellulose, Laxative, (CITRUCEL PO) Take 1 tablespoon before breakfast in water    [provider]  metoprolol succinate (TOPROL-XL) 50 MG 24 hr tablet TAKE ONE AND 1/2 TABS BY MOUTH IN THE MORNING 10/24/20   Biagio Borg, MD  montelukast (SINGULAIR) 10 MG tablet TAKE 1 TABLET BY MOUTH EVERY DAY 10/30/20   Biagio Borg, MD  Multiple Vitamins-Minerals (ONE-A-DAY WOMENS 50+ ADVANTAGE) TABS Take 1 tablet by mouth daily.    [provider]  naproxen sodium (ALEVE) 220 MG tablet Take 220 mg by mouth.    [provider]  OVER THE COUNTER MEDICATION 2 (two) times daily.     [provider]  pantoprazole (PROTONIX) 40 MG tablet Take 1 tablet (40 mg total) by mouth daily. 10/01/20   Biagio Borg, MD  Probiotic Product (PROBIOTIC-10 PO) Take by mouth.    [provider]  sodium fluoride (DENTA 5000 PLUS) 1.1 % CREA dental cream Denta 5000 Plus 1.1 % cream  USE AS DIRECTED Patient not taking: Reported on 06/05/2020    [provider]  SYNTHROID 75 MCG tablet TAKE 1 TABLET BY MOUTH EVERY DAY 12/01/20   Biagio Borg,  MD    Family History Family History  Problem Relation Age of Onset  . Diabetes Mother   . Arthritis Mother   . Cancer Father   . Diabetes Father   . Colon cancer Neg Hx     Social History Social History   Tobacco Use  . Smoking status: Never Smoker  . Smokeless tobacco: Never Used  Substance Use Topics  . Alcohol use: No    Alcohol/week: 0.0 standard drinks  Comment: sober x 14 years  . Drug use: No     Allergies   Penicillins; Doxycycline; Antihistamines, diphenhydramine-type; Diphenhydramine; and Latex   Review of Systems Review of Systems  As stated above in HPI Physical Exam Triage Vital Signs ED Triage Vitals  Enc Vitals Group     BP 12/12/20 1739 (!) 136/50     Pulse Rate 12/12/20 1739 80     Resp 12/12/20 1739 18     Temp 12/12/20 1739 (!) 97.3 F (36.3 C)     Temp Source 12/12/20 1739 Oral     SpO2 --      Weight --      Height --      Head Circumference --      Peak Flow --      Pain Score 12/12/20 1742 0     Pain Loc --      Pain Edu? --      Excl. in [place]? --    No data found.  Updated Vital Signs BP (!) 136/50 (BP Location: Left Arm)   Pulse 80   Temp (!) 97.3 F (36.3 C) (Oral)   Resp 18   Visual Acuity Right Eye Distance:   Left Eye Distance:   Bilateral Distance:    Right Eye Near:   Left Eye Near:    Bilateral Near:     Physical Exam Vitals and nursing note reviewed.  Cardiovascular:     Rate and Rhythm: Normal rate and regular rhythm.     Heart sounds: Normal heart sounds.  Pulmonary:     Effort: Pulmonary effort is normal.     Breath sounds: Normal breath sounds.  Musculoskeletal:        General: No swelling, tenderness, deformity or signs of injury.  Lymphadenopathy:     Cervical: No cervical adenopathy.  Skin:    General: Skin is warm.     Capillary Refill: Capillary refill takes less than 2 seconds.     Findings: Erythema (There is a 1 mm superficial puncture wound of the right dorsal hand without bleeding ro  discharge ) present. No bruising.  Neurological:     General: No focal deficit present.     Motor: No weakness.      UC Treatments / Results  Labs (all labs ordered are listed, but only abnormal results are displayed) Labs Reviewed - No data to display  EKG   Radiology No results found.  Procedures Procedures (including critical care time)  Medications Ordered in UC Medications - No data to display  Initial Impression / Assessment and Plan / UC Course  I have reviewed the triage vital signs and the nursing notes.  Pertinent labs & imaging results that were available during my care of the patient were reviewed by me and considered in my medical decision making (see chart for details).     New.  Patient and I went over multiple times that she did not need a booster shot for her Tdap today as the indication has changed and is currently recommended for 10 years past last booster vaccine.  Repeatedly expressed concern that this information was not what was written on her card.  Again I discussed with her that the indication likely has changed since 2012.  She asked for a new Tdap card with the states written on it however I discussed that we do not have a Tdap card to give her today as she is not receiving a Tdap vaccination.  We did  discuss that that she has a cat bite injury of her hand that antibiotics are indicated and warranted to prevent systemic infection and complications.  We discussed that given her allergies to multiple antibiotics a dual medication regimen was recommended.  We discussed how to use along with common potential side effects and precautions.  We discussed red flag signs and symptoms.  Again at the end of our visit patient expressed concern that the Tdap card recommendation is 5 years and that she was leaving without an updated booster. I again slowly explained this to patient.   I spent 25 minutes dedicated to the care of this patient (face to face and non-face  to face) on the date of this encounter to include the following discussed above.    Final Clinical Impressions(s) / UC Diagnoses   Final diagnoses:  Cat bite, initial encounter   Discharge Instructions   None    ED Prescriptions    Medication Sig Dispense Auth. Provider   metroNIDAZOLE (FLAGYL) 500 MG tablet Take 1 tablet (500 mg total) by mouth 3 (three) times daily for 7 days. 21 tablet Covington, Sarah M, PA-C   sulfamethoxazole-trimethoprim (BACTRIM DS) 800-160 MG tablet Take 1 tablet by mouth 2 (two) times daily for 7 days. 14 tablet Hughie Closs, [place]     PDMP not reviewed this encounter.   Hughie Closs, PA-C 12/12/20 1815    Hughie Closs, PA-C 12/12/20 2007

## 2020-12-12 NOTE — ED Triage Notes (Signed)
Pt presents today with cat scratch or bite to bilateral hand (she is unsure which). This is her cat which she reports has had its rabies vaccine. She presents with a Tetanus card, last tetanus 03/25/17.

## 2020-12-23 ENCOUNTER — Other Ambulatory Visit: Payer: Self-pay | Admitting: Internal Medicine

## 2021-01-02 ENCOUNTER — Other Ambulatory Visit: Payer: Self-pay | Admitting: Internal Medicine

## 2021-01-13 ENCOUNTER — Ambulatory Visit (INDEPENDENT_AMBULATORY_CARE_PROVIDER_SITE_OTHER): Payer: Medicare Other | Admitting: Internal Medicine

## 2021-01-13 ENCOUNTER — Encounter: Payer: Self-pay | Admitting: Internal Medicine

## 2021-01-13 ENCOUNTER — Other Ambulatory Visit: Payer: Self-pay

## 2021-01-13 VITALS — BP 166/80 | HR 100 | Temp 98.6°F | Ht 59.0 in | Wt 136.0 lb

## 2021-01-13 DIAGNOSIS — E039 Hypothyroidism, unspecified: Secondary | ICD-10-CM | POA: Diagnosis not present

## 2021-01-13 DIAGNOSIS — Z0001 Encounter for general adult medical examination with abnormal findings: Secondary | ICD-10-CM

## 2021-01-13 DIAGNOSIS — F419 Anxiety disorder, unspecified: Secondary | ICD-10-CM

## 2021-01-13 DIAGNOSIS — R251 Tremor, unspecified: Secondary | ICD-10-CM

## 2021-01-13 DIAGNOSIS — E538 Deficiency of other specified B group vitamins: Secondary | ICD-10-CM | POA: Diagnosis not present

## 2021-01-13 DIAGNOSIS — E559 Vitamin D deficiency, unspecified: Secondary | ICD-10-CM | POA: Diagnosis not present

## 2021-01-13 DIAGNOSIS — F32A Depression, unspecified: Secondary | ICD-10-CM

## 2021-01-13 DIAGNOSIS — I10 Essential (primary) hypertension: Secondary | ICD-10-CM | POA: Diagnosis not present

## 2021-01-13 NOTE — Progress Notes (Signed)
Patient ID: Sue Green, female   DOB: 1946/09/17, 75 y.o.   MRN: [id number]         Chief Complaint:: wellness exam and Extremity Weakness  and trembles       HPI:  Sue Green is a 75 y.o. female here for wellness exam; up to date on preventive referrals and immunizations                        Also feels trembly all inside after ran out of ativan she has taken for years per psychiatry, their office is closed and she is unable to get a refill, but plans to call again later today.  Bp at home has been < 140/90.  Pt denies chest pain, increased sob or doe, wheezing, orthopnea, PND, increased LE swelling, palpitations, dizziness or syncope.   Pt denies polydipsia, polyuria, or new focal neuro s/s.   Pt denies fever, wt loss, night sweats, loss of appetite, or other constitutional symptoms  No other new complaints     Wt Readings from Last 3 Encounters:  01/13/21 136 lb (61.7 kg)  08/12/20 137 lb (62.1 kg)  06/05/20 135 lb 3.2 oz (61.3 kg)   BP Readings from Last 3 Encounters:  01/13/21 (!) 166/80  12/12/20 (!) 136/50  08/12/20 120/68   Immunization History  Administered Date(s) Administered  . Fluad Quad(high Dose 65+) 06/05/2020  . Influenza Split 07/05/2011  . Influenza, High Dose Seasonal PF 05/28/2018, 05/17/2019  . Influenza, Seasonal, Injecte, Preservative Fre 06/03/2013  . Influenza,inj,Quad PF,6+ Mos 07/03/2014, 05/30/2015  . Influenza-Unspecified 07/17/2012, 04/30/2016  . PFIZER(Purple Top)SARS-COV-2 Vaccination 11/05/2019, 11/26/2019  . Pneumococcal Conjugate-13 07/31/2013  . Pneumococcal Polysaccharide-23 07/17/2012  . Td 09/20/2000  . Tdap 07/17/2012, 03/25/2017  There are no preventive care reminders to display for this patient.    Past Medical History:  Diagnosis Date  . Allergy   . Anemia, iron deficiency 03/06/2014  . Anxiety   . Arthritis   . Asthma   . Chronic tension headaches    IN PAST  . Depression   . Fibromyalgia   . GERD  (gastroesophageal reflux disease)   . Glaucoma     Per pt, she does not have glaucoma.  . Hypertension   . Internal hemorrhoids   . Iron deficiency anemia   . Osteopenia   . Pyloric stenosis   . Scoliosis   . Thyroid disease    hypothyroidism   Past Surgical History:  Procedure Laterality Date  . APPENDECTOMY  1975  . SHOULDER ARTHROSCOPY  2001   rt shoulder  . TONSILLECTOMY AND ADENOIDECTOMY     75 years old    reports that she has never smoked. She has never used smokeless tobacco. She reports that she does not drink alcohol and does not use drugs. family history includes Arthritis in her mother; Cancer in her father; Diabetes in her father and mother. Allergies  Allergen Reactions  . Penicillins Hives  . Doxycycline Nausea Only  . Antihistamines, Diphenhydramine-Type   . Diphenhydramine   . Latex     BREAK OUT IN RASH   Current Outpatient Medications on File Prior to Visit  Medication Sig Dispense Refill  . Acetaminophen (TYLENOL ARTHRITIS PAIN PO) Take by mouth.    . AMBULATORY NON FORMULARY MEDICATION Nitroglycerine ointment 0.125 %  Apply a pea sized amount internally and on lesion three times daily for 6 weeks 30 g 1  . Ascorbic Acid (VITAMIN C) 500  MG tablet Take 500 mg by mouth daily.    . Calcium-Vitamin D-Vitamin K [phone] MG-UNT-MCG CHEW Chew 1 tablet by mouth 2 (two) times daily.    . cyclobenzaprine (FLEXERIL) 10 MG tablet TAKE 1 TABLET BY MOUTH EVERY DAY 30 tablet 5  . desvenlafaxine (PRISTIQ) 100 MG 24 hr tablet Take 100 mg by mouth daily.    . diclofenac sodium (VOLTAREN) 1 % GEL Apply 2 g topically 4 (four) times daily. 100 g 5  . Ferrous Sulfate (IRON) 28 MG TABS Take by mouth.    Marland Kitchen glucosamine-chondroitin 500-400 MG tablet Take 1 tablet by mouth 2 (two) times daily.    Sue Green Oil 300 MG CAPS Take by mouth daily.    Marland Kitchen LORazepam (ATIVAN) 1 MG tablet Take 1 mg by mouth 2 (two) times daily as needed.    . Methylcellulose, Laxative, (CITRUCEL PO) Take 1  tablespoon before breakfast in water    . metoprolol succinate (TOPROL-XL) 50 MG 24 hr tablet TAKE ONE AND 1/2 TABS BY MOUTH IN THE MORNING 135 tablet 1  . montelukast (SINGULAIR) 10 MG tablet TAKE 1 TABLET BY MOUTH EVERY DAY 90 tablet 1  . Multiple Vitamins-Minerals (ONE-A-DAY WOMENS 50+ ADVANTAGE) TABS Take 1 tablet by mouth daily.    . naproxen sodium (ALEVE) 220 MG tablet Take 220 mg by mouth.    Marland Kitchen OVER THE COUNTER MEDICATION 2 (two) times daily.     . pantoprazole (PROTONIX) 40 MG tablet TAKE 1 TABLET BY MOUTH EVERY DAY 90 tablet 1  . Probiotic Product (PROBIOTIC-10 PO) Take by mouth.    . sodium fluoride (PREVIDENT 5000 PLUS) 1.1 % CREA dental cream Denta 5000 Plus 1.1 % cream  USE AS DIRECTED    . SYNTHROID 75 MCG tablet TAKE 1 TABLET BY MOUTH EVERY DAY 90 tablet 1   No current facility-administered medications on file prior to visit.        ROS:  All others reviewed and negative.  Objective        PE:  BP (!) 166/80 (BP Location: Left Arm, Patient Position: Sitting, Cuff Size: Normal)   Pulse 100   Temp 98.6 F (37 C) (Oral)   Ht 4\' 11"  (1.499 m)   Wt 136 lb (61.7 kg)   SpO2 98%   BMI 27.47 kg/m                 Constitutional: Pt appears in NAD               HENT: Head: NCAT.                Right Ear: External ear normal.                 Left Ear: External ear normal.                Eyes: . Pupils are equal, round, and reactive to light. Conjunctivae and EOM are normal               Nose: without d/c or deformity               Neck: Neck supple. Gross normal ROM               Cardiovascular: Normal rate and regular rhythm.                 Pulmonary/Chest: Effort normal and breath sounds without rales or wheezing.  Abd:  Soft, NT, ND, + BS, no organomegaly               Neurological: Pt is alert. At baseline orientation, motor grossly intact               Skin: Skin is warm. No rashes, no other new lesions, LE edema - none               Psychiatric: Pt  behavior is normal without agitation   Micro: none  Cardiac tracings I have personally interpreted today:  none  Pertinent Radiological findings (summarize): none   Lab Results  Component Value Date   WBC 9.5 01/15/2021   HGB 13.1 01/15/2021   HCT 37.5 01/15/2021   PLT 388.0 01/15/2021   GLUCOSE 144 (H) 01/15/2021   CHOL 150 01/15/2021   TRIG 146.0 01/15/2021   HDL 42.40 01/15/2021   LDLDIRECT 98.0 12/13/2018   LDLCALC 78 01/15/2021   ALT 20 01/15/2021   AST 18 01/15/2021   NA 132 (L) 01/15/2021   K 4.1 01/15/2021   CL 94 (L) 01/15/2021   CREATININE 0.68 01/15/2021   BUN 16 01/15/2021   CO2 32 01/15/2021   TSH 0.81 01/15/2021   Assessment/Plan:  Sue Green is a 75 y.o. White or Caucasian [1] female with  has a past medical history of Allergy, Anemia, iron deficiency (03/06/2014), Anxiety, Arthritis, Asthma, Chronic tension headaches, Depression, Fibromyalgia, GERD (gastroesophageal reflux disease), Glaucoma, Hypertension, Internal hemorrhoids, Iron deficiency anemia, Osteopenia, Pyloric stenosis, Scoliosis, and Thyroid disease.  Encounter for well adult exam with abnormal findings .Age and sex appropriate education and counseling updated with regular exercise and diet Referrals for preventative services - none needed Immunizations addressed - none needed Smoking counseling  - none needed Evidence for depression or other mood disorder - none significant Most recent labs reviewed. I have personally reviewed and have noted: 1) the patient's medical and social history 2) The patient's current medications and supplements 3) The patient's height, weight, and BMI have been recorded in the chart   Involuntary trembling Most likely c/w mild withdrawal, pt encourage to f/u with psychiatry for atvian refill  Hypertension BP Readings from Last 3 Encounters:  01/13/21 (!) 166/80  12/12/20 (!) 136/50  08/12/20 120/68   Stable, pt to continue medical treatment  - none as  pt adamant BP at home is controlled   Hypothyroidism Lab Results  Component Value Date   TSH 0.81 01/15/2021   Stable, pt to continue levothyroxine   Followup: Return in about 6 months (around 07/15/2021).  Cathlean Cower, MD 01/19/2021 8:07 PM [facility]

## 2021-01-13 NOTE — Patient Instructions (Signed)
I think your trembling inside is likely due to being out of the lorazepam for the last 4-5 days.  Please continue all other medications as before, including to restart the lorazepam tonight as you have planned  Please have the pharmacy call with any other refills you may need.  Please continue your efforts at being more active, low cholesterol diet, and weight control.  You are otherwise up to date with prevention measures today.  Please keep your appointments with your specialists as you may have planned  Please go to the LAB at the blood drawing area for the tests to be done - at the [place] will be contacted by phone if any changes need to be made immediately.  Otherwise, you will receive a letter about your results with an explanation, but please check with MyChart first.  Please remember to sign up for MyChart if you have not done so, as this will be important to you in the future with finding out test results, communicating by private email, and scheduling acute appointments online when needed.  Please make an Appointment to return in 6 months, or sooner if needed

## 2021-01-14 ENCOUNTER — Other Ambulatory Visit (INDEPENDENT_AMBULATORY_CARE_PROVIDER_SITE_OTHER): Payer: Medicare Other

## 2021-01-14 DIAGNOSIS — I10 Essential (primary) hypertension: Secondary | ICD-10-CM

## 2021-01-14 LAB — URINALYSIS, ROUTINE W REFLEX MICROSCOPIC
Bilirubin Urine: NEGATIVE
Hgb urine dipstick: NEGATIVE
Ketones, ur: NEGATIVE
Leukocytes,Ua: NEGATIVE
Nitrite: NEGATIVE
RBC / HPF: NONE SEEN (ref 0–?)
Specific Gravity, Urine: <=1.005 — AB (ref 1.000–1.030)
Total Protein, Urine: NEGATIVE
Urine Glucose: 250 — AB
Urobilinogen, UA: 0.2 (ref 0.0–1.0)
WBC, UA: NONE SEEN (ref 0–?)
pH: 7 (ref 5.0–8.0)

## 2021-01-15 ENCOUNTER — Other Ambulatory Visit (INDEPENDENT_AMBULATORY_CARE_PROVIDER_SITE_OTHER): Payer: Medicare Other

## 2021-01-15 ENCOUNTER — Other Ambulatory Visit: Payer: Self-pay

## 2021-01-15 ENCOUNTER — Encounter: Payer: Self-pay | Admitting: Internal Medicine

## 2021-01-15 DIAGNOSIS — E559 Vitamin D deficiency, unspecified: Secondary | ICD-10-CM

## 2021-01-15 DIAGNOSIS — E538 Deficiency of other specified B group vitamins: Secondary | ICD-10-CM | POA: Diagnosis not present

## 2021-01-15 DIAGNOSIS — I10 Essential (primary) hypertension: Secondary | ICD-10-CM | POA: Diagnosis not present

## 2021-01-15 LAB — CBC WITH DIFFERENTIAL/PLATELET
Basophils Absolute: 0 10*3/uL (ref 0.0–0.1)
Basophils Relative: 0.3 % (ref 0.0–3.0)
Eosinophils Absolute: 0.1 10*3/uL (ref 0.0–0.7)
Eosinophils Relative: 0.9 % (ref 0.0–5.0)
HCT: 37.5 % (ref 36.0–46.0)
Hemoglobin: 13.1 g/dL (ref 12.0–15.0)
Lymphocytes Relative: 12.4 % (ref 12.0–46.0)
Lymphs Abs: 1.2 10*3/uL (ref 0.7–4.0)
MCHC: 34.9 g/dL (ref 30.0–36.0)
MCV: 96.5 fl (ref 78.0–100.0)
Monocytes Absolute: 1 10*3/uL (ref 0.1–1.0)
Monocytes Relative: 11.1 % (ref 3.0–12.0)
Neutro Abs: 7.1 10*3/uL (ref 1.4–7.7)
Neutrophils Relative %: 75.3 % (ref 43.0–77.0)
Platelets: 388 10*3/uL (ref 150.0–400.0)
RBC: 3.88 Mil/uL (ref 3.87–5.11)
RDW: 13.4 % (ref 11.5–15.5)
WBC: 9.5 10*3/uL (ref 4.0–10.5)

## 2021-01-15 LAB — LIPID PANEL
Cholesterol: 150 mg/dL (ref 0–200)
HDL: 42.4 mg/dL (ref >39.00)
LDL Cholesterol: 78 mg/dL (ref 0–99)
NonHDL: 107.53
Total CHOL/HDL Ratio: 4
Triglycerides: 146 mg/dL (ref 0.0–149.0)
VLDL: 29.2 mg/dL (ref 0.0–40.0)

## 2021-01-15 LAB — BASIC METABOLIC PANEL
BUN: 16 mg/dL (ref 6–23)
CO2: 32 mEq/L (ref 19–32)
Calcium: 9.4 mg/dL (ref 8.4–10.5)
Chloride: 94 mEq/L — ABNORMAL LOW (ref 96–112)
Creatinine, Ser: 0.68 mg/dL (ref 0.40–1.20)
GFR: 85.33 mL/min (ref >60.00)
Glucose, Bld: 144 mg/dL — ABNORMAL HIGH (ref 70–99)
Potassium: 4.1 mEq/L (ref 3.5–5.1)
Sodium: 132 mEq/L — ABNORMAL LOW (ref 135–145)

## 2021-01-15 LAB — HEPATIC FUNCTION PANEL
ALT: 20 U/L (ref 0–35)
AST: 18 U/L (ref 0–37)
Albumin: 4.3 g/dL (ref 3.5–5.2)
Alkaline Phosphatase: 77 U/L (ref 39–117)
Bilirubin, Direct: 0.1 mg/dL (ref 0.0–0.3)
Total Bilirubin: 0.2 mg/dL (ref 0.2–1.2)
Total Protein: 6.8 g/dL (ref 6.0–8.3)

## 2021-01-15 LAB — VITAMIN D 25 HYDROXY (VIT D DEFICIENCY, FRACTURES): VITD: 87.58 ng/mL (ref 30.00–100.00)

## 2021-01-15 LAB — VITAMIN B12: Vitamin B-12: >1506 pg/mL — ABNORMAL HIGH (ref 211–911)

## 2021-01-15 LAB — TSH: TSH: 0.81 u[IU]/mL (ref 0.35–4.50)

## 2021-01-19 ENCOUNTER — Encounter: Payer: Self-pay | Admitting: Internal Medicine

## 2021-01-19 NOTE — Assessment & Plan Note (Signed)
Lab Results  Component Value Date   TSH 0.81 01/15/2021   Stable, pt to continue levothyroxine

## 2021-01-19 NOTE — Assessment & Plan Note (Signed)
BP Readings from Last 3 Encounters:  01/13/21 (!) 166/80  12/12/20 (!) 136/50  08/12/20 120/68   Stable, pt to continue medical treatment  - none as pt adamant BP at home is controlled

## 2021-01-19 NOTE — Assessment & Plan Note (Signed)
Most likely c/w mild withdrawal, pt encourage to f/u with psychiatry for atvian refill

## 2021-01-19 NOTE — Assessment & Plan Note (Signed)

## 2021-03-11 ENCOUNTER — Other Ambulatory Visit: Payer: Self-pay | Admitting: Internal Medicine

## 2021-05-08 ENCOUNTER — Other Ambulatory Visit: Payer: Self-pay | Admitting: Internal Medicine

## 2021-05-08 NOTE — Telephone Encounter (Signed)
Please refill as per office routine med refill policy (all routine meds refilled for 3 mo or monthly per pt preference up to one year from last visit, then month to month grace period for 3 mo, then further med refills will have to be denied)  

## 2021-05-20 ENCOUNTER — Other Ambulatory Visit: Payer: Self-pay | Admitting: Internal Medicine

## 2021-06-02 ENCOUNTER — Telehealth: Payer: Self-pay | Admitting: Physician Assistant

## 2021-06-02 MED ORDER — AMBULATORY NON FORMULARY MEDICATION
0 refills | Status: DC
Start: 2021-06-02 — End: 2021-07-08

## 2021-06-02 NOTE — Telephone Encounter (Signed)
Spoke with Ronalee Belts at [facility], I have authorized 1 refill for Nitroglycerin ointment. Pt is aware that she will need to continue this until her appt with Anderson Malta, pt is aware that she will need to continue even if symptoms resolve. Pt verbalized understanding of all information and had no concerns at the end of the call.

## 2021-06-02 NOTE — Telephone Encounter (Signed)
Spoke with patient, pt reports that there seems to be a tear in the rectum,  she states that her symptoms started to become worse within the last 2 weeks. Pt reports BRB when wiping that started the other day. No constipation, straining, or diarrhea. No SOB, lightheadedness, or dizziness. Pt is requesting a refill of Nitroglycerin ointment until her appt with Anderson Malta on 07/07/21 at 3 PM. Pt would like refill sent to [facility]. Dr. Fuller Plan, jennifer is out of the office today. Please advise, thanks.

## 2021-06-02 NOTE — Telephone Encounter (Signed)
Refill NTG ointment as previously prescribed. Continue using NTG until office appt with JL as scheduled even if symptoms resolve.

## 2021-06-10 NOTE — Telephone Encounter (Signed)
Inbound call from pt requesting a call back stating that since she has taken the Nitroglycerin Ointment she has been feeling really hot. Please advise. Thank you.

## 2021-06-11 NOTE — Telephone Encounter (Signed)
Spoke with patient, she reports that she has been having rectal bleeding for about 1 week. Pt reports that it is BRB with wiping. Pt reports that she is using medicated wipes. She states that she is not taking any Dulcolax or Metamucil. She states that she takes 1 capful of Miralax every morning. She states that she has been using Nitroglycerin ointment 1-2 times a day. Pt states that her last BM was today and it was formed and not hard. Pt states that she has to strain some at the beginning of her bowel movement. Pt asked why would she be using the ointment that is causing her to bleed, she did not know what a fissure was (we discussed this). 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 Advised patient that it takes a long time for this area to heal. Re discussed recommendations from last OV. I asked patient about any dizziness and SOB and she wanted to discuss coming off of her Ativan, she is aware that she will need to contact the prescribing doctor about that. Pt wanted further recommendations. She has a follow up appt on Tuesday, 07/07/21 at 3 PM. Please advise, thanks.

## 2021-06-11 NOTE — Telephone Encounter (Signed)
Attempted to reach patient twice on her listed home number. Her vm is full and I am unable to leave a message at this time.

## 2021-06-12 NOTE — Telephone Encounter (Signed)
Attempted to reach patient at her listed home number. Her vm is full and I am unable to leave a vm at this time.

## 2021-06-15 NOTE — Telephone Encounter (Signed)
Attempted to reach patient at listed home number. Her vm is full, unable to leave a vm.

## 2021-06-16 NOTE — Telephone Encounter (Signed)
No return call received. Will await further contact from patient.

## 2021-06-22 ENCOUNTER — Ambulatory Visit: Payer: Medicare Other | Admitting: Behavioral Health

## 2021-07-01 ENCOUNTER — Ambulatory Visit: Payer: Medicare Other | Admitting: Adult Health

## 2021-07-07 ENCOUNTER — Ambulatory Visit: Payer: Medicare Other | Admitting: Physician Assistant

## 2021-07-07 ENCOUNTER — Telehealth: Payer: Self-pay | Admitting: Physician Assistant

## 2021-07-07 NOTE — Telephone Encounter (Signed)
Patient needs refill of nitroglycerine ointment.  She missed her appointment today, and there are no open appointments for the PA's for the rest of the year.  Patient wishes to use [facility] on [address] if you're able to call in a refill for her. Please call patient if this can or cannot be refilled.  Thank you,.

## 2021-07-08 MED ORDER — AMBULATORY NON FORMULARY MEDICATION: 0 refills | Status: AC

## 2021-07-08 NOTE — Telephone Encounter (Signed)
Lm on vm for patient to return call.   RX refill called in to [facility], gave verbal order to pharmacist [place]. This is a compounded ointment and has to be filled at a compound pharmacy.

## 2021-07-09 NOTE — Telephone Encounter (Signed)
Attempted to reach patient three times, someone answers and then hangs up. No option to leave a vm.

## 2021-07-13 NOTE — Telephone Encounter (Signed)
Attempted to reach patient again, her phone goes straight to vm. Lm on vm for patient to return call.

## 2021-07-15 NOTE — Telephone Encounter (Signed)
No return call received, will await further contact from patient.

## 2021-07-17 ENCOUNTER — Telehealth: Payer: Self-pay | Admitting: Gastroenterology

## 2021-07-17 NOTE — Telephone Encounter (Signed)
Patient called back and she wanted an earlier appointment iwht Dr. Fuller Plan.  After 20 minutes on the phone of her repeating the same statements, she has agreed to keep the appointment with Ellouise Newer, PA rather than wait for Dr. Lynne Leader next available in December.

## 2021-07-17 NOTE — Telephone Encounter (Signed)
Patient called states she is highly concerned about having abdominal pain and having pancreatic cancer because she states her father passed away from that seeking to speak with a nurse.

## 2021-07-17 NOTE — Telephone Encounter (Signed)
Attempted return call x 3.  Someone picks up the phone then hangs up.

## 2021-07-20 ENCOUNTER — Emergency Department (HOSPITAL_COMMUNITY)
Admission: EM | Admit: 2021-07-20 | Discharge: 2021-07-21 | Disposition: A | Discharge status: Home / Self Care | Payer: Medicare Other | Attending: Emergency Medicine | Admitting: Emergency Medicine

## 2021-07-20 ENCOUNTER — Other Ambulatory Visit: Payer: Self-pay

## 2021-07-20 DIAGNOSIS — Z5321 Procedure and treatment not carried out due to patient leaving prior to being seen by health care provider: Secondary | ICD-10-CM | POA: Diagnosis not present

## 2021-07-20 DIAGNOSIS — K5900 Constipation, unspecified: Secondary | ICD-10-CM | POA: Diagnosis not present

## 2021-07-20 DIAGNOSIS — R109 Unspecified abdominal pain: Secondary | ICD-10-CM | POA: Diagnosis present

## 2021-07-20 LAB — COMPREHENSIVE METABOLIC PANEL
ALT: 11 U/L (ref 0–44)
AST: 15 U/L (ref 15–41)
Albumin: 3.4 g/dL — ABNORMAL LOW (ref 3.5–5.0)
Alkaline Phosphatase: 78 U/L (ref 38–126)
Anion gap: 9 (ref 5–15)
BUN: 12 mg/dL (ref 8–23)
CO2: 29 mmol/L (ref 22–32)
Calcium: 9.5 mg/dL (ref 8.9–10.3)
Chloride: 99 mmol/L (ref 98–111)
Creatinine, Ser: 0.62 mg/dL (ref 0.44–1.00)
GFR, Estimated: >60 mL/min (ref >60)
Glucose, Bld: 146 mg/dL — ABNORMAL HIGH (ref 70–99)
Potassium: 4.2 mmol/L (ref 3.5–5.1)
Sodium: 137 mmol/L (ref 135–145)
Total Bilirubin: 0.3 mg/dL (ref 0.3–1.2)
Total Protein: 6.3 g/dL — ABNORMAL LOW (ref 6.5–8.1)

## 2021-07-20 LAB — CBC
HCT: 29 % — ABNORMAL LOW (ref 36.0–46.0)
Hemoglobin: 9.8 g/dL — ABNORMAL LOW (ref 12.0–15.0)
MCH: 33.9 pg (ref 26.0–34.0)
MCHC: 33.8 g/dL (ref 30.0–36.0)
MCV: 100.3 fL — ABNORMAL HIGH (ref 80.0–100.0)
Platelets: 399 10*3/uL (ref 150–400)
RBC: 2.89 MIL/uL — ABNORMAL LOW (ref 3.87–5.11)
RDW: 13.6 % (ref 11.5–15.5)
WBC: 13.2 10*3/uL — ABNORMAL HIGH (ref 4.0–10.5)
nRBC: 0 % (ref 0.0–0.2)

## 2021-07-20 LAB — LIPASE, BLOOD: Lipase: 29 U/L (ref 11–51)

## 2021-07-20 NOTE — ED Triage Notes (Signed)
Pt from home via EMS for intermittent abdominal pain x 3 weeks, worsening since yesterday. Denies n/v/d. Endorses mild constipation, which is normal for her.

## 2021-07-20 NOTE — ED Notes (Signed)
Patient called x3 for vitals recheck with no response and not visible in lobby

## 2021-07-22 ENCOUNTER — Observation Stay (HOSPITAL_COMMUNITY)
Admission: EM | Admit: 2021-07-22 | Discharge: 2021-07-25 | Disposition: A | Discharge status: Home / Self Care | Payer: Medicare Other | Attending: Gastroenterology | Admitting: Gastroenterology

## 2021-07-22 ENCOUNTER — Emergency Department (HOSPITAL_COMMUNITY): Payer: Medicare Other

## 2021-07-22 ENCOUNTER — Encounter (HOSPITAL_COMMUNITY): Payer: Self-pay | Admitting: Emergency Medicine

## 2021-07-22 DIAGNOSIS — K8020 Calculus of gallbladder without cholecystitis without obstruction: Secondary | ICD-10-CM | POA: Diagnosis not present

## 2021-07-22 DIAGNOSIS — Z91040 Latex allergy status: Secondary | ICD-10-CM | POA: Diagnosis not present

## 2021-07-22 DIAGNOSIS — K2950 Unspecified chronic gastritis without bleeding: Secondary | ICD-10-CM | POA: Diagnosis not present

## 2021-07-22 DIAGNOSIS — K922 Gastrointestinal hemorrhage, unspecified: Secondary | ICD-10-CM

## 2021-07-22 DIAGNOSIS — E039 Hypothyroidism, unspecified: Secondary | ICD-10-CM | POA: Diagnosis not present

## 2021-07-22 DIAGNOSIS — K269 Duodenal ulcer, unspecified as acute or chronic, without hemorrhage or perforation: Secondary | ICD-10-CM | POA: Diagnosis not present

## 2021-07-22 DIAGNOSIS — J45909 Unspecified asthma, uncomplicated: Secondary | ICD-10-CM | POA: Diagnosis not present

## 2021-07-22 DIAGNOSIS — R195 Other fecal abnormalities: Secondary | ICD-10-CM | POA: Diagnosis present

## 2021-07-22 DIAGNOSIS — K315 Obstruction of duodenum: Secondary | ICD-10-CM | POA: Diagnosis not present

## 2021-07-22 DIAGNOSIS — J4520 Mild intermittent asthma, uncomplicated: Secondary | ICD-10-CM | POA: Diagnosis present

## 2021-07-22 DIAGNOSIS — D509 Iron deficiency anemia, unspecified: Secondary | ICD-10-CM | POA: Insufficient documentation

## 2021-07-22 DIAGNOSIS — R1011 Right upper quadrant pain: Secondary | ICD-10-CM | POA: Diagnosis present

## 2021-07-22 DIAGNOSIS — D62 Acute posthemorrhagic anemia: Secondary | ICD-10-CM | POA: Insufficient documentation

## 2021-07-22 DIAGNOSIS — K3189 Other diseases of stomach and duodenum: Secondary | ICD-10-CM | POA: Insufficient documentation

## 2021-07-22 DIAGNOSIS — R1013 Epigastric pain: Secondary | ICD-10-CM | POA: Diagnosis present

## 2021-07-22 DIAGNOSIS — K219 Gastro-esophageal reflux disease without esophagitis: Secondary | ICD-10-CM | POA: Diagnosis not present

## 2021-07-22 DIAGNOSIS — Z79899 Other long term (current) drug therapy: Secondary | ICD-10-CM | POA: Diagnosis not present

## 2021-07-22 DIAGNOSIS — I10 Essential (primary) hypertension: Secondary | ICD-10-CM | POA: Diagnosis not present

## 2021-07-22 DIAGNOSIS — Z20822 Contact with and (suspected) exposure to covid-19: Secondary | ICD-10-CM | POA: Insufficient documentation

## 2021-07-22 DIAGNOSIS — E876 Hypokalemia: Secondary | ICD-10-CM | POA: Insufficient documentation

## 2021-07-22 LAB — CBC
HCT: 27.8 % — ABNORMAL LOW (ref 36.0–46.0)
HCT: 26.3 % — ABNORMAL LOW (ref 36.0–46.0)
Hemoglobin: 9.4 g/dL — ABNORMAL LOW (ref 12.0–15.0)
Hemoglobin: 8.9 g/dL — ABNORMAL LOW (ref 12.0–15.0)
MCH: 33.6 pg (ref 26.0–34.0)
MCH: 33.7 pg (ref 26.0–34.0)
MCHC: 33.8 g/dL (ref 30.0–36.0)
MCHC: 33.8 g/dL (ref 30.0–36.0)
MCV: 99.3 fL (ref 80.0–100.0)
MCV: 99.6 fL (ref 80.0–100.0)
Platelets: 471 10*3/uL — ABNORMAL HIGH (ref 150–400)
Platelets: 432 10*3/uL — ABNORMAL HIGH (ref 150–400)
RBC: 2.8 MIL/uL — ABNORMAL LOW (ref 3.87–5.11)
RBC: 2.64 MIL/uL — ABNORMAL LOW (ref 3.87–5.11)
RDW: 13.6 % (ref 11.5–15.5)
RDW: 13.6 % (ref 11.5–15.5)
WBC: 10.3 10*3/uL (ref 4.0–10.5)
WBC: 10.4 10*3/uL (ref 4.0–10.5)
nRBC: 0 % (ref 0.0–0.2)
nRBC: 0 % (ref 0.0–0.2)

## 2021-07-22 LAB — URINALYSIS, ROUTINE W REFLEX MICROSCOPIC
Bilirubin Urine: NEGATIVE
Glucose, UA: NEGATIVE mg/dL
Hgb urine dipstick: NEGATIVE
Ketones, ur: 5 mg/dL — AB
Leukocytes,Ua: NEGATIVE
Nitrite: NEGATIVE
Protein, ur: NEGATIVE mg/dL
Specific Gravity, Urine: >1.046 — ABNORMAL HIGH (ref 1.005–1.030)
pH: 6 (ref 5.0–8.0)

## 2021-07-22 LAB — COMPREHENSIVE METABOLIC PANEL
ALT: 12 U/L (ref 0–44)
AST: 15 U/L (ref 15–41)
Albumin: 3.6 g/dL (ref 3.5–5.0)
Alkaline Phosphatase: 78 U/L (ref 38–126)
Anion gap: 7 (ref 5–15)
BUN: 20 mg/dL (ref 8–23)
CO2: 26 mmol/L (ref 22–32)
Calcium: 9.1 mg/dL (ref 8.9–10.3)
Chloride: 101 mmol/L (ref 98–111)
Creatinine, Ser: 0.49 mg/dL (ref 0.44–1.00)
GFR, Estimated: >60 mL/min (ref >60)
Glucose, Bld: 117 mg/dL — ABNORMAL HIGH (ref 70–99)
Potassium: 3.4 mmol/L — ABNORMAL LOW (ref 3.5–5.1)
Sodium: 134 mmol/L — ABNORMAL LOW (ref 135–145)
Total Bilirubin: 0.4 mg/dL (ref 0.3–1.2)
Total Protein: 6.9 g/dL (ref 6.5–8.1)

## 2021-07-22 LAB — RESP PANEL BY RT-PCR (FLU A&B, COVID) ARPGX2
Influenza A by PCR: NEGATIVE
Influenza B by PCR: NEGATIVE
SARS Coronavirus 2 by RT PCR: NEGATIVE

## 2021-07-22 LAB — IRON AND TIBC
Iron: 26 ug/dL — ABNORMAL LOW (ref 28–170)
Saturation Ratios: 8 % — ABNORMAL LOW (ref 10.4–31.8)
TIBC: 334 ug/dL (ref 250–450)
UIBC: 308 ug/dL

## 2021-07-22 LAB — RETICULOCYTES
Immature Retic Fract: 35.9 % — ABNORMAL HIGH (ref 2.3–15.9)
RBC.: 2.67 MIL/uL — ABNORMAL LOW (ref 3.87–5.11)
Retic Count, Absolute: 123.6 10*3/uL (ref 19.0–186.0)
Retic Ct Pct: 4.6 % — ABNORMAL HIGH (ref 0.4–3.1)

## 2021-07-22 LAB — POC OCCULT BLOOD, ED: Fecal Occult Bld: POSITIVE — AB

## 2021-07-22 LAB — VITAMIN B12: Vitamin B-12: 1436 pg/mL — ABNORMAL HIGH (ref 180–914)

## 2021-07-22 LAB — FERRITIN: Ferritin: 24 ng/mL (ref 11–307)

## 2021-07-22 LAB — LIPASE, BLOOD: Lipase: 28 U/L (ref 11–51)

## 2021-07-22 LAB — TROPONIN I (HIGH SENSITIVITY): Troponin I (High Sensitivity): 6 ng/L (ref <18)

## 2021-07-22 MED ORDER — ONDANSETRON HCL 4 MG/2ML IJ SOLN: 4.0000 mg | Freq: Four times a day (QID) | INTRAMUSCULAR | Status: DC | PRN

## 2021-07-22 MED ORDER — ACETAMINOPHEN 650 MG RE SUPP: 650.0000 mg | Freq: Four times a day (QID) | RECTAL | Status: DC | PRN

## 2021-07-22 MED ORDER — ALBUTEROL SULFATE (2.5 MG/3ML) 0.083% IN NEBU: 2.5000 mg | INHALATION_SOLUTION | RESPIRATORY_TRACT | Status: DC | PRN

## 2021-07-22 MED ORDER — VENLAFAXINE HCL ER 150 MG PO CP24
150.0000 mg | ORAL_CAPSULE | Freq: Every day | ORAL | Status: DC
  Administered 2021-07-24 – 2021-07-25 (×2): 150 mg via ORAL
  Filled 2021-07-22 (×3): qty 1

## 2021-07-22 MED ORDER — LORAZEPAM 0.5 MG PO TABS: 0.5000 mg | ORAL_TABLET | Freq: Two times a day (BID) | ORAL | Status: DC | PRN

## 2021-07-22 MED ORDER — METOPROLOL SUCCINATE ER 50 MG PO TB24
75.0000 mg | ORAL_TABLET | Freq: Every day | ORAL | Status: DC
  Administered 2021-07-23 – 2021-07-25 (×3): 75 mg via ORAL
  Filled 2021-07-22 (×4): qty 1

## 2021-07-22 MED ORDER — MONTELUKAST SODIUM 10 MG PO TABS
10.0000 mg | ORAL_TABLET | Freq: Every day | ORAL | Status: DC
  Administered 2021-07-23 (×2): 10 mg via ORAL
  Filled 2021-07-22 (×4): qty 1

## 2021-07-22 MED ORDER — LEVOTHYROXINE SODIUM 75 MCG PO TABS
75.0000 ug | ORAL_TABLET | Freq: Every day | ORAL | Status: DC
  Administered 2021-07-23 – 2021-07-24 (×3): 75 ug via ORAL
  Filled 2021-07-22 (×3): qty 1

## 2021-07-22 MED ORDER — IOHEXOL 350 MG/ML SOLN
80.0000 mL | Freq: Once | INTRAVENOUS | Status: AC | PRN
  Administered 2021-07-22: 80 mL via INTRAVENOUS

## 2021-07-22 MED ORDER — HYDRALAZINE HCL 20 MG/ML IJ SOLN: 10.0000 mg | Freq: Four times a day (QID) | INTRAMUSCULAR | Status: DC | PRN

## 2021-07-22 MED ORDER — PANTOPRAZOLE SODIUM 40 MG IV SOLR
40.0000 mg | Freq: Two times a day (BID) | INTRAVENOUS | Status: DC
  Administered 2021-07-23 – 2021-07-24 (×4): 40 mg via INTRAVENOUS
  Filled 2021-07-22 (×4): qty 40

## 2021-07-22 MED ORDER — ONDANSETRON HCL 4 MG PO TABS: 4.0000 mg | ORAL_TABLET | Freq: Four times a day (QID) | ORAL | Status: DC | PRN

## 2021-07-22 MED ORDER — ACETAMINOPHEN 325 MG PO TABS
650.0000 mg | ORAL_TABLET | Freq: Four times a day (QID) | ORAL | Status: DC | PRN
  Administered 2021-07-23: 650 mg via ORAL
  Filled 2021-07-22: qty 2

## 2021-07-22 MED ORDER — POTASSIUM CHLORIDE CRYS ER 20 MEQ PO TBCR
40.0000 meq | EXTENDED_RELEASE_TABLET | Freq: Once | ORAL | Status: AC
  Administered 2021-07-22: 40 meq via ORAL
  Filled 2021-07-22: qty 2

## 2021-07-22 MED ORDER — PANTOPRAZOLE SODIUM 40 MG IV SOLR
40.0000 mg | Freq: Once | INTRAVENOUS | Status: AC
  Administered 2021-07-22: 40 mg via INTRAVENOUS
  Filled 2021-07-22: qty 40

## 2021-07-22 MED ORDER — MORPHINE SULFATE (PF) 4 MG/ML IV SOLN
4.0000 mg | Freq: Once | INTRAVENOUS | Status: DC
  Filled 2021-07-22: qty 1

## 2021-07-22 MED ORDER — HYDROCODONE-ACETAMINOPHEN 5-325 MG PO TABS: 1.0000 | ORAL_TABLET | Freq: Once | ORAL | Status: DC

## 2021-07-22 MED ORDER — MORPHINE SULFATE (PF) 4 MG/ML IV SOLN
4.0000 mg | Freq: Once | INTRAVENOUS | Status: AC
  Administered 2021-07-22: 4 mg via INTRAVENOUS
  Filled 2021-07-22: qty 1

## 2021-07-22 NOTE — ED Provider Notes (Signed)
[place] DEPT Provider Note   CSN: [id number] Arrival date & time: 07/22/21  1114     History Chief Complaint  Patient presents with   Abdominal Pain    Sue Green is a 75 y.o. female with a past medical history significant for iron deficiency anemia, asthma, fibromyalgia, depression, GERD, hypertension, and hypothyroidism who presents to the ED due to epigastric and right upper quadrant pain x3 weeks.  Pain is intermittent.  Patient is unsure whether or not the pain is worse after eating due to decreased p.o. intake.  No fever or chills.  Patient denies nausea, vomiting, and diarrhea.  Patient has had a previous appendectomy however, no other abdominal operations.  Denies chest pain and shortness of breath.  No lower extremity edema.  Last bowel movement was earlier today.  No urinary or vaginal symptoms.  She admits to taking 2 Aleve daily for arthritis. No alcohol use. No treatment prior to arrival.  No aggravating or alleviating factors.  History obtained from patient and past medical records. No interpreter used during encounter.       Past Medical History:  Diagnosis Date   Allergy    Anemia, iron deficiency 03/06/2014   Anxiety    Arthritis    Asthma    Chronic tension headaches    IN PAST   Depression    Fibromyalgia    GERD (gastroesophageal reflux disease)    Glaucoma     Per pt, she does not have glaucoma.   Hypertension    Internal hemorrhoids    Iron deficiency anemia    Osteopenia    Pyloric stenosis    Scoliosis    Thyroid disease    hypothyroidism    Patient Active Problem List   Diagnosis Date Noted   Involuntary trembling 01/13/2021   Left sided abdominal pain 04/17/2020   Ganglion cyst of joint of finger of right hand 06/08/2019   Carotid stenosis 11/26/2016   Acute upper respiratory infection 10/05/2016   Dysuria 10/05/2016   Memory loss 08/23/2014   Acute sinus infection 08/23/2014   Anemia, iron  deficiency 03/06/2014   Encounter for well adult exam with abnormal findings 07/31/2013   Urinary urgency 07/17/2012   Chronic cough 07/17/2012   Hypertension 07/26/2011   Anxiety and depression 07/26/2011   Constipation 07/26/2011   GE reflux 07/26/2011   Osteopenia 07/26/2011   Asthma 07/26/2011   Tension headache 07/26/2011   Hypothyroidism 07/26/2011   History of alcohol abuse 07/26/2011   Allergic rhinitis 07/26/2011   Fibromyalgia 07/26/2011    Past Surgical History:  Procedure Laterality Date   APPENDECTOMY  1975   SHOULDER ARTHROSCOPY  2001   rt shoulder   TONSILLECTOMY AND ADENOIDECTOMY     75 years old     OB History   No obstetric history on file.     Family History  Problem Relation Age of Onset   Diabetes Mother    Arthritis Mother    Cancer Father    Diabetes Father    Colon cancer Neg Hx     Social History   Tobacco Use   Smoking status: Never   Smokeless tobacco: Never  Substance Use Topics   Alcohol use: No    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   Drug use: No    Home Medications Prior to Admission medications   Medication Sig Start Date End Date Taking? Authorizing Provider  Acetaminophen (TYLENOL ARTHRITIS PAIN  PO) Take by mouth.    [provider]  AMBULATORY NON FORMULARY MEDICATION Nitroglycerine ointment 0.125 %  Apply a pea sized amount internally and on lesion three times daily for 6 weeks 07/08/21   Levin Erp, PA  Ascorbic Acid (VITAMIN C) 500 MG tablet Take 500 mg by mouth daily.    [provider]  Calcium-Vitamin D-Vitamin K [id number] MG-UNT-MCG CHEW Chew 1 tablet by mouth 2 (two) times daily.    [provider]  cyclobenzaprine (FLEXERIL) 10 MG tablet TAKE 1 TABLET BY MOUTH EVERY DAY 05/20/21   Biagio Borg, MD  desvenlafaxine (PRISTIQ) 100 MG 24 hr tablet Take 100 mg by mouth daily.    [provider]  diclofenac sodium (VOLTAREN) 1 % GEL Apply 2 g  topically 4 (four) times daily. 06/08/19   Biagio Borg, MD  Ferrous Sulfate (IRON) 28 MG TABS Take by mouth.    [provider]  glucosamine-chondroitin 500-400 MG tablet Take 1 tablet by mouth 2 (two) times daily.    [provider]  Astrid Drafts 300 MG CAPS Take by mouth daily.    [provider]  LORazepam (ATIVAN) 1 MG tablet Take 1 mg by mouth 2 (two) times daily as needed.    [provider]  Methylcellulose, Laxative, (CITRUCEL PO) Take 1 tablespoon before breakfast in water    [provider]  metoprolol succinate (TOPROL-XL) 50 MG 24 hr tablet TAKE ONE AND 1/2 TABS BY MOUTH IN THE MORNING 03/11/21   Biagio Borg, MD  montelukast (SINGULAIR) 10 MG tablet TAKE 1 TABLET BY MOUTH EVERY DAY 03/11/21   Biagio Borg, MD  Multiple Vitamins-Minerals (ONE-A-DAY WOMENS 50+ ADVANTAGE) TABS Take 1 tablet by mouth daily.    [provider]  naproxen sodium (ALEVE) 220 MG tablet Take 220 mg by mouth.    [provider]  OVER THE COUNTER MEDICATION 2 (two) times daily.     [provider]  pantoprazole (PROTONIX) 40 MG tablet TAKE 1 TABLET BY MOUTH EVERY DAY 01/03/21   Biagio Borg, MD  Probiotic Product (PROBIOTIC-10 PO) Take by mouth.    [provider]  sodium fluoride (PREVIDENT 5000 PLUS) 1.1 % CREA dental cream Denta 5000 Plus 1.1 % cream  USE AS DIRECTED    [provider]  SYNTHROID 75 MCG tablet TAKE 1 TABLET BY MOUTH EVERY DAY 05/12/21   Biagio Borg, MD    Allergies    Penicillins; Doxycycline; Antihistamines, diphenhydramine-type; Diphenhydramine; and Latex  Review of Systems   Review of Systems  Constitutional:  Negative for chills and fever.  Respiratory:  Negative for shortness of breath.   Cardiovascular:  Negative for chest pain.  Gastrointestinal:  Positive for abdominal pain. Negative for diarrhea, nausea and vomiting.  Genitourinary:  Negative for dysuria.  Musculoskeletal:  Negative for  back pain.  All other systems reviewed and are negative.  Physical Exam Updated Vital Signs BP (!) 148/84 (BP Location: Right Arm)   Pulse 98   Temp 98.5 F (36.9 C) (Oral)   Resp 19   SpO2 98%   Physical Exam Vitals and nursing note reviewed.  Constitutional:      General: She is not in acute distress.    Appearance: She is not ill-appearing.  HENT:     Head: Normocephalic.  Eyes:     Pupils: Pupils are equal, round, and reactive to light.  Cardiovascular:     Rate and Rhythm: Normal rate  and regular rhythm.     Pulses: Normal pulses.     Heart sounds: Normal heart sounds. No murmur heard.   No friction rub. No gallop.  Pulmonary:     Effort: Pulmonary effort is normal.     Breath sounds: Normal breath sounds.  Abdominal:     General: Abdomen is flat. There is no distension.     Palpations: Abdomen is soft.     Tenderness: There is abdominal tenderness. There is guarding. There is no rebound.     Comments: TTP in epigastric and RUQ with guarding.  Genitourinary:    Comments: Rectal exam performed with chaperone in room.  Small amount of stool obtained.  Fecal occult positive. Musculoskeletal:        General: Normal range of motion.     Cervical back: Neck supple.  Skin:    General: Skin is warm and dry.  Neurological:     General: No focal deficit present.     Mental Status: She is alert.  Psychiatric:        Mood and Affect: Mood normal.        Behavior: Behavior normal.    ED Results / Procedures / Treatments   Labs (all labs ordered are listed, but only abnormal results are displayed) Labs Reviewed  COMPREHENSIVE METABOLIC PANEL - Abnormal; Notable for the following components:      Result Value   Sodium 134 (*)    Potassium 3.4 (*)    Glucose, Bld 117 (*)    All other components within normal limits  CBC - Abnormal; Notable for the following components:   RBC 2.80 (*)    Hemoglobin 9.4 (*)    HCT 27.8 (*)    Platelets 471 (*)    All other  components within normal limits  LIPASE, BLOOD  POC OCCULT BLOOD, ED  TROPONIN I (HIGH SENSITIVITY)    EKG None  Radiology CT ABDOMEN PELVIS W CONTRAST  Result Date: 07/22/2021 CLINICAL DATA:  Pain upper abdomen EXAM: CT ABDOMEN AND PELVIS WITH CONTRAST TECHNIQUE: Multidetector CT imaging of the abdomen and pelvis was performed using the standard protocol following bolus administration of intravenous contrast. CONTRAST:  68mL OMNIPAQUE IOHEXOL 350 MG/ML SOLN COMPARISON:  08/12/2010 FINDINGS: Lower chest: Unremarkable Hepatobiliary: Liver measures 13.2 cm. There is fatty infiltration. In image 11 of series 2, there is 6 mm low-density in the left lobe, possibly a cyst. In image 14, there is 9 mm possible cyst in the liver. There are multiple gallbladder stones. There is no dilation of bile ducts. Pancreas: No focal abnormality is seen Spleen: Unremarkable. Adrenals/Urinary Tract: Adrenals are not enlarged. There is no hydronephrosis. There are no renal or ureteral stones. Urinary bladder is unremarkable. Stomach/Bowel: Stomach is unremarkable. Small bowel loops are not dilated. Appendix is not seen. There is no pericecal inflammation. Moderate to large amount of stool is seen in ascending, transverse and descending colon. There is no wall thickening or pericolic stranding. Vascular/Lymphatic: Atherosclerotic plaques and calcifications are seen in aorta and its major branches. Reproductive: Unremarkable Other: There is no ascites or pneumoperitoneum Musculoskeletal: Degenerative changes are noted in lower thoracic spine and lumbar spine. Dextroscoliosis is seen in the lumbar spine. IMPRESSION: There is no evidence intestinal obstruction or pneumoperitoneum. There is no hydronephrosis. Gallbladder stones. There is no dilation of bile ducts. Other findings as described in the body of the report. Electronically Signed   By: Elmer Picker M.D.   On: 07/22/2021 16:53    Procedures  Procedures    Medications Ordered in ED Medications  morphine 4 MG/ML injection 4 mg (has no administration in time range)  iohexol (OMNIPAQUE) 350 MG/ML injection 80 mL (80 mLs Intravenous Contrast Given 07/22/21 1638)    ED Course  I have reviewed the triage vital signs and the nursing notes.  Pertinent labs & imaging results that were available during my care of the patient were reviewed by me and considered in my medical decision making (see chart for details).  Clinical Course as of 07/22/21 2016  Wed Jul 22, 2021  1730 Potassium(!): 3.4 [CA]  2009 Fecal Occult Blood, POC(!): POSITIVE [CA]    Clinical Course User Index [CA] Suzy Bouchard, PA-C   MDM Rules/Calculators/A&P                          75 year old female presents to the ED due to epigastric and right upper quadrant pain that has been intermittent for the past 3 weeks.  No nausea, vomiting, diarrhea, fever, or chills.  Upon arrival, stable vitals.  Triage noted patient be tachycardic 103 however, during my initial evaluation, patient's heart rate in the 90s.  Patient nontoxic-appearing.  Physical exam significant for tenderness in epigastric and right upper quadrant.  Labs and CT abdomen ordered at triage.  Added troponin and EKG to rule out atypical ACS.  Right upper quadrant ultrasound to rule out acute cholecystitis due to significant tenderness. IV morphine given.  CBC significant for anemia with hemoglobin at 9.4. Patient hemoglobin appears to have dropped significantly over the past 6 months. No leukocytosis.  Thrombocytosis at 471.  CMP significant for mild hyponatremia 134 and hypokalemia 3.4.  CT abdomen demonstrates possible liver cyst and fatty infiltrates.  Numerous gallstones.  RUQ negative for signs of acute cholecystitis. Troponin normal. Low suspicion for atypical ACS.   Given drop in hemoglobin with fecal occult positive will consult hospitalist for admission to trend hemoglobin. Discussed with Dr. Rogene Houston who  evaluated patient at bedside and agrees with assessment and plan. Pain could be related to PUD vs. Gastritis vs. Symptomatic gallstones. Patient may benefit from surgery consultation while admitted to the hospital.   8:37 PM Discussed with Dr. Cyd Silence with [facility] who agrees to admit her.  Final Clinical Impression(s) / ED Diagnoses Final diagnoses:  RUQ pain    Rx / DC Orders ED Discharge Orders     None        Karie Kirks 07/22/21 2040    Fredia Sorrow, MD 07/22/21 2106

## 2021-07-22 NOTE — ED Triage Notes (Signed)
Pt presents with c/o upper quad abdominal pain for approx one week, worse since Monday. Pt went to [facility] for same on Monday but left before being seen because of wait time.

## 2021-07-22 NOTE — ED Provider Notes (Signed)
Emergency Medicine Provider Triage Evaluation Note  Sue Green , a 75 y.o. female  was evaluated in triage.  Pt complains of upper abdominal pain over the last week.  Was seen evaluated at [facility] but left before getting evaluated due to wait time.  Pain has been intermittent since onset.  Her abdominal pain is poorly characterized.  Radiates up to the chest.  No associated symptoms.  Denies fever, chills, nausea, vomiting, diarrhea, constipation, urinary complaints.  Family history of pancreatic cancer.  Review of Systems  Positive:  Negative: See above.   Physical Exam  BP (!) 145/62 (BP Location: Right Arm)   Pulse (!) 103   Temp 98.5 F (36.9 C) (Oral)   Resp 16   SpO2 100%  Gen:   Awake, no distress   Resp:  Normal effort  MSK:   Moves extremities without difficulty  Other:  Upper abdomen is tender to palpation.  Medical Decision Making  Medically screening exam initiated at 1:55 PM.  Appropriate orders placed.  BONETTA MOSTEK was informed that the remainder of the evaluation will be completed by another provider, this initial triage assessment does not replace that evaluation, and the importance of remaining in the ED until their evaluation is complete.     Myna Bright [place], PA-C 07/22/21 1356    Carmin Muskrat, MD 07/22/21 [phone]

## 2021-07-22 NOTE — H&P (Signed)
History and Physical    Sue Green AJO:[id number] DOB: 1946-05-04 DOA: 07/22/2021  PCP: Biagio Borg, MD  Patient coming from: Home   Chief Complaint:  Chief Complaint  Patient presents with   Abdominal Pain     HPI:    75 year old female with past medical history of hypertension, asthma, gastroesophageal reflux disease, hypothyroidism who presents to [facility] emergency department with complaints of abdominal pain.  Patient states the abdominal pain began approximately 1 week ago.  Abdominal pain was initially mild in intensity progressively became more and more severe.  Abdominal pain is mainly located in the epigastric region and occasionally radiates up into the chest.  Patient describes the pain as intermittent and is unclear as to what exacerbates her symptoms.  Patient is also complaining of extremely poor oral intake due to abdominal discomfort.  Patient denies any associated nausea, vomiting hematemesis, recent ingestion of undercooked food, recent travel or sick contacts.  Patient denies regular alcohol use.  Patient does admit to taking Naproxen on a daily basis due to longstanding history of osteoarthritis.  Patient's pain continued to worsen until she eventually presented to [facility] emergency department on 10/31 via EMS for evaluation.  Patient unfortunately left without being seen due to lengthy wait times.  Due to patient's continually progressive pain, patient once again returned to [facility] emergency department for evaluation earlier today.  Upon evaluation in the emergency department, patient was found to actively be complaining of substantial epigastric abdominal pain.  Patient was administered several doses of intravenous morphine for pain control.  CBC revealed hemoglobin 9.4, down from 13.16 months ago and 9.8 when she presented on 10/31.  A rectal exam did not reveal gross bleeding but was Hemoccult positive.  CT imaging of the  abdomen and pelvis as well as right upper quadrant ultrasound were performed revealing cholelithiasis without cholecystitis without an alternative explanation for her symptoms.  Patient was given a dose of 40 mg of intravenous Protonix.  Due to patient's ongoing abdominal pain and concerns for upper gastrointestinal bleeding the hospitalist group was then called to assess the patient for admission to the hospital.    Review of Systems:   Review of Systems  Gastrointestinal:  Positive for abdominal pain.   Past Medical History:  Diagnosis Date   Allergy    Anemia, iron deficiency 03/06/2014   Anxiety    Arthritis    Asthma    Chronic tension headaches    IN PAST   Depression    Fibromyalgia    GERD (gastroesophageal reflux disease)    Glaucoma     Per pt, she does not have glaucoma.   Hypertension    Internal hemorrhoids    Iron deficiency anemia    Osteopenia    Pyloric stenosis    Scoliosis    Thyroid disease    hypothyroidism    Past Surgical History:  Procedure Laterality Date   APPENDECTOMY  1975   SHOULDER ARTHROSCOPY  2001   rt shoulder   TONSILLECTOMY AND ADENOIDECTOMY     74 years old     reports that she has never smoked. She has never used smokeless tobacco. She reports that she does not drink alcohol and does not use drugs.  Allergies  Allergen Reactions   Penicillins Hives   Doxycycline Nausea Only   Antihistamines, Diphenhydramine-Type Other (See Comments)    Reaction not recalled   Diphenhydramine Other (See Comments)    Reaction not recalled  Lorazepam Other (See Comments)    Caused trembling in the arms, per the patient   Latex Rash and Other (See Comments)    Patient disputes this in 2022    Family History  Problem Relation Age of Onset   Diabetes Mother    Arthritis Mother    Cancer Father    Diabetes Father    Colon cancer Neg Hx      Prior to Admission medications   Medication Sig Start Date End Date Taking? Authorizing Provider   Acetaminophen (TYLENOL ARTHRITIS PAIN PO) Take by mouth.    [provider]  AMBULATORY NON FORMULARY MEDICATION Nitroglycerine ointment 0.125 %  Apply a pea sized amount internally and on lesion three times daily for 6 weeks 07/08/21   Levin Erp, PA  Ascorbic Acid (VITAMIN C) 500 MG tablet Take 500 mg by mouth daily.    [provider]  Calcium-Vitamin D-Vitamin K [id number] 9943-40 MG-UNT-MCG CHEW Chew 1 tablet by mouth 2 (two) times daily.    [provider]  cyclobenzaprine (FLEXERIL) 10 MG tablet TAKE 1 TABLET BY MOUTH EVERY DAY 05/20/21   Biagio Borg, MD  desvenlafaxine (PRISTIQ) 100 MG 24 hr tablet Take 100 mg by mouth daily.    [provider]  diclofenac sodium (VOLTAREN) 1 % GEL Apply 2 g topically 4 (four) times daily. 06/08/19   Biagio Borg, MD  Ferrous Sulfate (IRON) 28 MG TABS Take by mouth.    [provider]  glucosamine-chondroitin 500-400 MG tablet Take 1 tablet by mouth 2 (two) times daily.    [provider]  Astrid Drafts 300 MG CAPS Take by mouth daily.    [provider]  LORazepam (ATIVAN) 1 MG tablet Take 1 mg by mouth 2 (two) times daily as needed.    [provider]  Methylcellulose, Laxative, (CITRUCEL PO) Take 1 tablespoon before breakfast in water    [provider]  metoprolol succinate (TOPROL-XL) 50 MG 24 hr tablet TAKE ONE AND 1/2 TABS BY MOUTH IN THE MORNING 03/11/21   Biagio Borg, MD  montelukast (SINGULAIR) 10 MG tablet TAKE 1 TABLET BY MOUTH EVERY DAY 03/11/21   Biagio Borg, MD  Multiple Vitamins-Minerals (ONE-A-DAY WOMENS 50+ ADVANTAGE) TABS Take 1 tablet by mouth daily.    [provider]  naproxen sodium (ALEVE) 220 MG tablet Take 220 mg by mouth.    [provider]  OVER THE COUNTER MEDICATION 2 (two) times daily.     [provider]  pantoprazole (PROTONIX) 40 MG tablet TAKE 1 TABLET BY MOUTH EVERY DAY 01/03/21   Biagio Borg, MD  Probiotic  Product (PROBIOTIC-10 PO) Take by mouth.    [provider]  sodium fluoride (PREVIDENT 5000 PLUS) 1.1 % CREA dental cream Denta 5000 Plus 1.1 % cream  USE AS DIRECTED    [provider]  SYNTHROID 75 MCG tablet TAKE 1 TABLET BY MOUTH EVERY DAY 05/12/21   Biagio Borg, MD    Physical Exam: Vitals:   07/22/21 2333 07/23/21 0017 07/23/21 0412 07/23/21 0812  BP: (!) 133/48 (!) 164/88 127/62 136/85  Pulse: 86 86 76 72  Resp: 17 18 18 18   Temp: 98 F (36.7 C) 97.9 F (36.6 C) 98 F (36.7 C) 98.3 F (36.8 C)  TempSrc: Oral Oral Oral Oral  SpO2: 100% 100% 100% 100%    Constitutional: Awake alert and oriented x3, in distress due to epigastric pain. Skin: no rashes, no lesions,  poor skin turgor noted.  Increasing pallor noted. Eyes: Pupils are equally reactive to light.  No evidence of scleral icterus or conjunctival pallor.  ENMT: Dry mucous membranes noted.  Posterior pharynx clear of any exudate or lesions.   Neck: normal, supple, no masses, no thyromegaly.  No evidence of jugular venous distension.   Respiratory: clear to auscultation bilaterally, no wheezing, no crackles. Normal respiratory effort. No accessory muscle use.  Cardiovascular: Regular rate and rhythm, no murmurs / rubs / gallops. No extremity edema. 2+ pedal pulses. No carotid bruits.  Chest:   Nontender without crepitus or deformity.   Back:   Nontender without crepitus or deformity. Abdomen: Notable epigastric tenderness.  Abdomen is soft however..  No evidence of intra-abdominal masses.  Positive bowel sounds noted in all quadrants.   Musculoskeletal: No joint deformity upper and lower extremities. Good ROM, no contractures. Normal muscle tone.  Neurologic: CN 2-12 grossly intact. Sensation intact.  Patient moving all 4 extremities spontaneously.  Patient is following all commands.  Patient is responsive to verbal stimuli.   Psychiatric: Patient exhibits normal mood with appropriate affect.  Patient  seems to possess insight as to their current situation.     Labs on Admission: I have personally reviewed following labs and imaging studies -   CBC: Recent Labs  Lab 07/20/21 1542 07/22/21 1356 07/22/21 2150 07/23/21 0435  WBC 13.2* 10.3 10.4 9.9  HGB 9.8* 9.4* 8.9* 8.2*  HCT 29.0* 27.8* 26.3* 24.6*  MCV 100.3* 99.3 99.6 100.8*  PLT 399 471* 432* 536   Basic Metabolic Panel: Recent Labs  Lab 07/20/21 1542 07/22/21 1356 07/23/21 0446  NA 137 134* 130*  K 4.2 3.4* 4.3  CL 99 101 101  CO2 29 26 21*  GLUCOSE 146* 117* 100*  BUN 12 20 14   CREATININE 0.62 0.49 0.52  CALCIUM 9.5 9.1 8.3*  MG  --   --  2.2   GFR: CrCl cannot be calculated (Unknown ideal weight.). Liver Function Tests: Recent Labs  Lab 07/20/21 1542 07/22/21 1356 07/23/21 0446  AST 15 15 16   ALT 11 12 10   ALKPHOS 78 78 67  BILITOT 0.3 0.4 0.5  PROT 6.3* 6.9 5.9*  ALBUMIN 3.4* 3.6 3.2*   Recent Labs  Lab 07/20/21 1542 07/22/21 1356  LIPASE 29 28   No results for input(s): AMMONIA in the last 168 hours. Coagulation Profile: No results for input(s): INR, PROTIME in the last 168 hours. Cardiac Enzymes: No results for input(s): CKTOTAL, CKMB, CKMBINDEX, TROPONINI in the last 168 hours. BNP (last 3 results) No results for input(s): PROBNP in the last 8760 hours. HbA1C: No results for input(s): HGBA1C in the last 72 hours. CBG: No results for input(s): GLUCAP in the last 168 hours. Lipid Profile: No results for input(s): CHOL, HDL, LDLCALC, TRIG, CHOLHDL, LDLDIRECT in the last 72 hours. Thyroid Function Tests: No results for input(s): TSH, T4TOTAL, FREET4, T3FREE, THYROIDAB in the last 72 hours. Anemia Panel: Recent Labs    07/22/21 2150  VITAMINB12 1,436*  FOLATE 48.4  FERRITIN 24  TIBC 334  IRON 26*  RETICCTPCT 4.6*   Urine analysis:    Component Value Date/Time   COLORURINE YELLOW 07/22/2021 1900   APPEARANCEUR CLEAR 07/22/2021 1900   LABSPEC >1.046 (H) 07/22/2021 1900    PHURINE 6.0 07/22/2021 1900   GLUCOSEU NEGATIVE 07/22/2021 1900   GLUCOSEU 250 (A) 01/14/2021 1209   HGBUR NEGATIVE 07/22/2021 1900   BILIRUBINUR NEGATIVE 07/22/2021 1900   BILIRUBINUR neg 07/17/2012 1200  KETONESUR 5 (A) 07/22/2021 1900   PROTEINUR NEGATIVE 07/22/2021 1900   UROBILINOGEN 0.2 01/14/2021 1209   NITRITE NEGATIVE 07/22/2021 1900   LEUKOCYTESUR NEGATIVE 07/22/2021 1900    Radiological Exams on Admission - Personally Reviewed: CT ABDOMEN PELVIS W CONTRAST  Result Date: 07/22/2021 CLINICAL DATA:  Pain upper abdomen EXAM: CT ABDOMEN AND PELVIS WITH CONTRAST TECHNIQUE: Multidetector CT imaging of the abdomen and pelvis was performed using the standard protocol following bolus administration of intravenous contrast. CONTRAST:  2mL OMNIPAQUE IOHEXOL 350 MG/ML SOLN COMPARISON:  08/12/2010 FINDINGS: Lower chest: Unremarkable Hepatobiliary: Liver measures 13.2 cm. There is fatty infiltration. In image 11 of series 2, there is 6 mm low-density in the left lobe, possibly a cyst. In image 14, there is 9 mm possible cyst in the liver. There are multiple gallbladder stones. There is no dilation of bile ducts. Pancreas: No focal abnormality is seen Spleen: Unremarkable. Adrenals/Urinary Tract: Adrenals are not enlarged. There is no hydronephrosis. There are no renal or ureteral stones. Urinary bladder is unremarkable. Stomach/Bowel: Stomach is unremarkable. Small bowel loops are not dilated. Appendix is not seen. There is no pericecal inflammation. Moderate to large amount of stool is seen in ascending, transverse and descending colon. There is no wall thickening or pericolic stranding. Vascular/Lymphatic: Atherosclerotic plaques and calcifications are seen in aorta and its major branches. Reproductive: Unremarkable Other: There is no ascites or pneumoperitoneum Musculoskeletal: Degenerative changes are noted in lower thoracic spine and lumbar spine. Dextroscoliosis is seen in the lumbar spine.  IMPRESSION: There is no evidence intestinal obstruction or pneumoperitoneum. There is no hydronephrosis. Gallbladder stones. There is no dilation of bile ducts. Other findings as described in the body of the report. Electronically Signed   By: Elmer Picker M.D.   On: 07/22/2021 16:53   US Abdomen Limited RUQ (LIVER/GB)  Result Date: 07/22/2021 CLINICAL DATA:  Right upper quadrant pain. EXAM: ULTRASOUND ABDOMEN LIMITED RIGHT UPPER QUADRANT COMPARISON:  None. CT abdomen and pelvis 07/22/2021. FINDINGS: Gallbladder: Gallstones are present. The largest gallstone measures 1.2 cm. No sonographic Murphy sign noted by sonographer. Common bile duct: Diameter: 3 mm Liver: In the left lobe of the liver there is a 9 x 8 x 9 mm simple cyst. Within normal limits in parenchymal echogenicity. Portal vein is patent on color Doppler imaging with normal direction of blood flow towards the liver. Other: None. IMPRESSION: 1. Cholelithiasis. No additional sonographic evidence for acute cholecystitis. 2. Small hepatic cyst. Electronically Signed   By: Ronney Asters M.D.   On: 07/22/2021 19:12    EKG: Personally reviewed.  Rhythm is normal sinus rhythm with heart rate of 99 bpm.  No dynamic ST segment changes appreciated.  Assessment/Plan  * Acute upper gastrointestinal bleeding Patient presenting with a drop in hemoglobin from 13.1 several months ago to 9.4 on presentation today This is in the setting of severe epigastric pain and tenderness on exam Patient endorses a longstanding history of NSAID use using naproxen twice daily for many months to years Concern for NSAID induced gastropathy Cycling CBCs every 6 hours Initiating intravenous PPI every 12 hours Type and screen We will transfuse if hemoglobin drops below 7 Discussed case with [facility] -patient unlikely to go for EGD on 11/3 (maybe 11/4) and therefore we will keep patient on clears with diet for now until they can see patient in  consultation.  Epigastric pain CT and ultrasound imaging nonrevealing as to the etiology of patient's pain Considering patient's epigastric tenderness and drop in hemoglobin  I am concerned about NSAID induced gastropathy/gastritis or ulcer as the etiology See remainder of assessment and plan as above.  Hypokalemia, inadequate intake Replacing with potassium chloride Evaluating for concurrent hypomagnesemia  Monitoring potassium levels with serial chemistries.   Essential hypertension Resume patients home regimen of oral antihypertensives PRN intravenous antihypertensives for excessively elevated blood pressure    GERD (gastroesophageal reflux disease) Placed on intravenous proton pump inhibitor as noted above.  Hypothyroidism Resume home regimen of Synthroid    Mild intermittent asthma without complication No evidence of acute asthma exacerbation As needed bronchodilator therapy for shortness of breath and wheezing.      Code Status:  Full code  code status decision has been confirmed with: patient Family Communication:  deferred   Status is: Observation  The patient remains OBS appropriate and will d/c before 2 midnights.       Vernelle Emerald MD [facility] Pager [phone]  If 7PM-7AM, please contact night-coverage [email or web address] Use universal [place] password for that web site. If you do not have the password, please call the hospital operator.  07/23/2021, 9:00 AM

## 2021-07-23 ENCOUNTER — Other Ambulatory Visit: Payer: Self-pay

## 2021-07-23 DIAGNOSIS — R195 Other fecal abnormalities: Secondary | ICD-10-CM

## 2021-07-23 DIAGNOSIS — R1013 Epigastric pain: Secondary | ICD-10-CM | POA: Diagnosis not present

## 2021-07-23 DIAGNOSIS — K922 Gastrointestinal hemorrhage, unspecified: Secondary | ICD-10-CM | POA: Diagnosis not present

## 2021-07-23 DIAGNOSIS — D500 Iron deficiency anemia secondary to blood loss (chronic): Secondary | ICD-10-CM | POA: Diagnosis not present

## 2021-07-23 LAB — CBC
HCT: 24.6 % — ABNORMAL LOW (ref 36.0–46.0)
HCT: 25.1 % — ABNORMAL LOW (ref 36.0–46.0)
Hemoglobin: 8.2 g/dL — ABNORMAL LOW (ref 12.0–15.0)
Hemoglobin: 8.3 g/dL — ABNORMAL LOW (ref 12.0–15.0)
MCH: 33.6 pg (ref 26.0–34.0)
MCH: 33.1 pg (ref 26.0–34.0)
MCHC: 33.3 g/dL (ref 30.0–36.0)
MCHC: 33.1 g/dL (ref 30.0–36.0)
MCV: 100.8 fL — ABNORMAL HIGH (ref 80.0–100.0)
MCV: 100 fL (ref 80.0–100.0)
Platelets: 379 10*3/uL (ref 150–400)
Platelets: 402 10*3/uL — ABNORMAL HIGH (ref 150–400)
RBC: 2.44 MIL/uL — ABNORMAL LOW (ref 3.87–5.11)
RBC: 2.51 MIL/uL — ABNORMAL LOW (ref 3.87–5.11)
RDW: 13.4 % (ref 11.5–15.5)
RDW: 13.5 % (ref 11.5–15.5)
WBC: 9.9 10*3/uL (ref 4.0–10.5)
WBC: 7.2 10*3/uL (ref 4.0–10.5)
nRBC: 0 % (ref 0.0–0.2)
nRBC: 0 % (ref 0.0–0.2)

## 2021-07-23 LAB — COMPREHENSIVE METABOLIC PANEL
ALT: 10 U/L (ref 0–44)
AST: 16 U/L (ref 15–41)
Albumin: 3.2 g/dL — ABNORMAL LOW (ref 3.5–5.0)
Alkaline Phosphatase: 67 U/L (ref 38–126)
Anion gap: 8 (ref 5–15)
BUN: 14 mg/dL (ref 8–23)
CO2: 21 mmol/L — ABNORMAL LOW (ref 22–32)
Calcium: 8.3 mg/dL — ABNORMAL LOW (ref 8.9–10.3)
Chloride: 101 mmol/L (ref 98–111)
Creatinine, Ser: 0.52 mg/dL (ref 0.44–1.00)
GFR, Estimated: >60 mL/min (ref >60)
Glucose, Bld: 100 mg/dL — ABNORMAL HIGH (ref 70–99)
Potassium: 4.3 mmol/L (ref 3.5–5.1)
Sodium: 130 mmol/L — ABNORMAL LOW (ref 135–145)
Total Bilirubin: 0.5 mg/dL (ref 0.3–1.2)
Total Protein: 5.9 g/dL — ABNORMAL LOW (ref 6.5–8.1)

## 2021-07-23 LAB — TYPE AND SCREEN
ABO/RH(D): B POS
Antibody Screen: NEGATIVE

## 2021-07-23 LAB — ABO/RH: ABO/RH(D): B POS

## 2021-07-23 LAB — FOLATE: Folate: 48.4 ng/mL (ref >5.9)

## 2021-07-23 LAB — MAGNESIUM: Magnesium: 2.2 mg/dL (ref 1.7–2.4)

## 2021-07-23 MED ORDER — TRAZODONE HCL 50 MG PO TABS
50.0000 mg | ORAL_TABLET | Freq: Once | ORAL | Status: AC
  Administered 2021-07-23: 50 mg via ORAL
  Filled 2021-07-23: qty 1

## 2021-07-23 MED ORDER — ALBUTEROL SULFATE (2.5 MG/3ML) 0.083% IN NEBU: 2.5000 mg | INHALATION_SOLUTION | Freq: Four times a day (QID) | RESPIRATORY_TRACT | Status: DC | PRN

## 2021-07-23 MED ORDER — OXYCODONE HCL 5 MG PO TABS
5.0000 mg | ORAL_TABLET | ORAL | Status: DC | PRN
Start: 2021-07-23 — End: 2021-07-25

## 2021-07-23 MED ORDER — TRAMADOL HCL 50 MG PO TABS
50.0000 mg | ORAL_TABLET | Freq: Four times a day (QID) | ORAL | Status: DC | PRN
  Administered 2021-07-23: 50 mg via ORAL
  Filled 2021-07-23: qty 1

## 2021-07-23 NOTE — Assessment & Plan Note (Signed)
.   Resume patients home regimen of oral antihypertensives . PRN intravenous antihypertensives for excessively elevated blood pressure

## 2021-07-23 NOTE — H&P (View-Only) (Signed)
Referring Provider: Dr. Cyd Silence, [facility] Primary Care Physician:  Biagio Borg, MD Primary Gastroenterologist:  Dr. Fuller Plan  Reason for Consultation:  Anemia and heme positive stool  HPI: Sue Green is a 75 y.o. female with past medical history of hypertension, asthma, gastroesophageal reflux disease, arthritis, pylori stenosis, hypothyroidism who presents to [facility] emergency department with complaints of abdominal pain.   She tells me that the abdominal pain began about a week ago.  Very intense in her upper abdomen.  No nausea or vomiting.  No pain currently.  No dark or blood stools.  Says that she is on iron and sometimes they are very dark brown, but would not say black per se.    Lab studies notable for the following:  Hemoglobin was 13.1 g 6 months ago.  Three days ago was 9.8 g and now is down 8.2 g.  MCV is normal, actually slightly high.  BUN is normal.  Folate and vitamin B12 levels are normal.  Iron studies are low with a ferritin of 24, serum iron 26, iron saturation 8%.  She is Hemoccult positive.  Right upper quadrant abdominal ultrasound shows cholelithiasis and a small hepatic cyst.  CT of the abdomen and pelvis with contrast showed gallstones, but no biliary ductal dilatation.  There are other incidental findings, but nothing acute.  She does use NSAIDs in the form of Aleve regularly for quite some time.  08/18/2011 colonoscopy was normal.  Repeat recommended in 10 years.  EGD in December 2014 showed retained gastric solids and stenosis at the pylorus.  Follow-up upper GI with small bowel follow-through in December 2014 showed the following:  IMPRESSION:  1. Initially sluggish transit of barium from the stomach into the duodenum compatible with reported pyloric stenosis ; the pylorus was difficulty directly image due to its orientation on today's exam. No definite mass in this vicinity was observed. Gastric emptying was  subsequently somewhat brisk,  query transient pyloric spasm.  2. Lumbar scoliosis and spondylosis.     Past Medical History:  Diagnosis Date   Allergy    Anemia, iron deficiency 03/06/2014   Anxiety    Arthritis    Asthma    Chronic tension headaches    IN PAST   Depression    Fibromyalgia    GERD (gastroesophageal reflux disease)    Glaucoma     Per pt, she does not have glaucoma.   Hypertension    Internal hemorrhoids    Iron deficiency anemia    Osteopenia    Pyloric stenosis    Scoliosis    Thyroid disease    hypothyroidism    Past Surgical History:  Procedure Laterality Date   APPENDECTOMY  1975   SHOULDER ARTHROSCOPY  2001   rt shoulder   TONSILLECTOMY AND ADENOIDECTOMY     75 years old    Prior to Admission medications   Medication Sig Start Date End Date Taking? Authorizing Provider  ALEVE 220 MG tablet Take 220 mg by mouth in the morning and at bedtime.   Yes [provider]  AMBULATORY NON FORMULARY MEDICATION Nitroglycerine ointment 0.125 %  Apply a pea sized amount internally and on lesion three times daily for 6 weeks 07/08/21  Yes Lemmon, Lavone Nian, PA  Artificial Saliva (ACT DRY MOUTH) LOZG Use as directed 1 lozenge in the mouth or throat every 6 (six) hours as needed (for a dry mouth).   Yes [provider]  Ascorbic Acid (VITAMIN C) 500  MG tablet Take 500 mg by mouth daily.   Yes [provider]  busPIRone (BUSPAR) 10 MG tablet Take 10 mg by mouth 2 (two) times daily with a meal.   Yes [provider]  cyclobenzaprine (FLEXERIL) 10 MG tablet TAKE 1 TABLET BY MOUTH EVERY DAY Patient taking differently: Take 10 mg by mouth in the morning. 05/20/21  Yes Biagio Borg, MD  desvenlafaxine (PRISTIQ) 100 MG 24 hr tablet Take 100 mg by mouth daily.   Yes [provider]  Ferrous Sulfate (IRON) 28 MG TABS Take 28 mg by mouth daily with breakfast.   Yes [provider]  Methylcellulose, Laxative, (CITRUCEL PO) See admin instructions.  Mix 1 tablespoonful of powder into water and drink before breakfast every day   Yes [provider]  metoprolol succinate (TOPROL-XL) 50 MG 24 hr tablet TAKE ONE AND 1/2 TABS BY MOUTH IN THE MORNING Patient taking differently: Take 75 mg by mouth in the morning. 03/11/21  Yes Biagio Borg, MD  Misc Natural Products (OSTEO BI-FLEX TRIPLE STRENGTH) TABS Take 1 tablet by mouth in the morning and at bedtime.   Yes [provider]  montelukast (SINGULAIR) 10 MG tablet TAKE 1 TABLET BY MOUTH EVERY DAY Patient taking differently: Take 10 mg by mouth at bedtime. 03/11/21  Yes Biagio Borg, MD  Multiple Vitamins-Minerals (ONE-A-DAY WOMENS 50+ ADVANTAGE) TABS Take 1 tablet by mouth daily.   Yes [provider]  OCUVITE LUTEIN 25 25-5 MG CAPS Take 1 capsule by mouth daily.   Yes [provider]  polyethylene glycol powder (GLYCOLAX/MIRALAX) 17 GM/SCOOP powder Take 8.5 g by mouth See admin instructions. Mix 8.5 grams into 4-8 ounces of water and drink by mouth once a day   Yes [provider]  SYNTHROID 75 MCG tablet TAKE 1 [place] Patient taking differently: Take 75 mcg by mouth at bedtime. 05/12/21  Yes Biagio Borg, MD  TYLENOL 8 HOUR 650 MG CR tablet Take 650 mg by mouth in the morning and at bedtime.   Yes [provider]  Calcium-Vitamin D-Vitamin K [id number] MG-UNT-MCG CHEW Chew 1 tablet by mouth 2 (two) times daily. Patient not taking: Reported on 07/22/2021    [provider]  diclofenac sodium (VOLTAREN) 1 % GEL Apply 2 g topically 4 (four) times daily. 06/08/19   Biagio Borg, MD  Krill Oil 300 MG CAPS Take 300 mg by mouth daily.    [provider]  LORazepam (ATIVAN) 1 MG tablet Take 1 mg by mouth 2 (two) times daily as needed. Patient not taking: Reported on 07/22/2021    [provider]  pantoprazole (PROTONIX) 40 MG tablet TAKE 1 TABLET BY MOUTH EVERY DAY Patient not taking: Reported on 07/22/2021  01/03/21   Biagio Borg, MD  sodium fluoride (PREVIDENT 5000 PLUS) 1.1 % CREA dental cream Denta 5000 Plus 1.1 % cream  USE AS DIRECTED Patient not taking: No sig reported    [provider]    Current Facility-Administered Medications  Medication Dose Route Frequency Provider Last Rate Last Admin   acetaminophen (TYLENOL) tablet 650 mg  650 mg Oral Q6H PRN Shalhoub, Sherryll Burger, MD       Or   acetaminophen (TYLENOL) suppository 650 mg  650 mg Rectal Q6H PRN Shalhoub, Sherryll Burger, MD       albuterol (PROVENTIL) (2.5 MG/3ML) 0.083% nebulizer solution 2.5 mg  2.5 mg Nebulization Q4H PRN Shalhoub, Sherryll Burger, MD  hydrALAZINE (APRESOLINE) injection 10 mg  10 mg Intravenous Q6H PRN Shalhoub, Sherryll Burger, MD       levothyroxine (SYNTHROID) tablet 75 mcg  75 mcg Oral QHS Vernelle Emerald, MD   75 mcg at 07/23/21 0007   metoprolol succinate (TOPROL-XL) 24 hr tablet 75 mg  75 mg Oral Daily Vernelle Emerald, MD   75 mg at 07/23/21 0007   montelukast (SINGULAIR) tablet 10 mg  10 mg Oral QHS Vernelle Emerald, MD   10 mg at 07/23/21 0008   morphine 4 MG/ML injection 4 mg  4 mg Intravenous Once Vernelle Emerald, MD       ondansetron [facility]) tablet 4 mg  4 mg Oral Q6H PRN Vernelle Emerald, MD       Or   ondansetron [facility]) injection 4 mg  4 mg Intravenous Q6H PRN Shalhoub, Sherryll Burger, MD       pantoprazole (PROTONIX) injection 40 mg  40 mg Intravenous Q12H Vernelle Emerald, MD       venlafaxine XR (EFFEXOR-XR) 24 hr capsule 150 mg  150 mg Oral Q breakfast Shalhoub, Sherryll Burger, MD        Allergies as of 07/22/2021 - Review Complete 07/22/2021  Allergen Reaction Noted   Penicillins Hives 12/29/2010   Doxycycline Nausea Only 12/29/2010   Antihistamines, diphenhydramine-type Other (See Comments) 02/10/2016   Diphenhydramine Other (See Comments) 08/18/2011   Lorazepam Other (See Comments) 07/22/2021   Latex Rash and Other (See Comments) 09/04/2013    Family History  Problem Relation Age  of Onset   Diabetes Mother    Arthritis Mother    Cancer Father    Diabetes Father    Colon cancer Neg Hx     Social History   Socioeconomic History   Marital status: Divorced    Spouse name: Not on file   Number of children: Not on file   Years of education: 16   Highest education level: Not on file  Occupational History   Occupation: Retired  Tobacco Use   Smoking status: Never   Smokeless tobacco: Never  Substance and Sexual Activity   Alcohol use: No    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  Physical Exam: Vital signs in last 24 hours: Temp:  [97.9 F (36.6 C)-98.5 F (36.9 C)] 98.3 F (36.8 C) (11/03 0812) Pulse Rate:  [72-103] 72 (11/03 0812) Resp:  [16-19] 18 (11/03 0812) BP: (127-164)/(48-88) 136/85 (11/03 0812) SpO2:  [98 %-100 %] 100 % (11/03 0812) Last BM Date: 07/21/21 General:  Alert, Well-developed, well-nourished, pleasant and cooperative in NAD Head:  Normocephalic and atraumatic. Eyes:  Sclera clear, no icterus.  Conjunctiva pink. Ears:  Normal auditory acuity. Mouth:  No deformity or lesions.   Lungs:  Clear throughout to auscultation.  No wheezes, crackles, or rhonchi.  Heart:  Regular rate and rhythm; no murmurs, clicks, rubs, or  gallops. Abdomen:  Soft, non-distended.  BS present.  Non-tender. Rectal:  Looked externally as she reports recurrent issues with a "tear" near her anus, but nothing was noted today.  She says that it  seems to be healed recently.  Was heme positive per EDP. Msk:  Symmetrical without gross deformities. Pulses:  Normal pulses noted. Extremities:  Without clubbing or edema. Neurologic:  Alert and oriented x 4;  grossly normal neurologically. Skin:  Intact without significant lesions or rashes. Psych:  Alert and cooperative. Normal mood and affect.  Lab Results: Recent Labs    07/22/21 1356 07/22/21 2150 07/23/21 0435  WBC 10.3 10.4 9.9  HGB 9.4* 8.9* 8.2*  HCT 27.8* 26.3* 24.6*  PLT 471* 432* 379   BMET Recent Labs    07/20/21 1542 07/22/21 1356 07/23/21 0446  NA 137 134* 130*  K 4.2 3.4* 4.3  CL 99 101 101  CO2 29 26 21*  GLUCOSE 146* 117* 100*  BUN 12 20 14   CREATININE 0.62 0.49 0.52  CALCIUM 9.5 9.1 8.3*   LFT Recent Labs    07/23/21 0446  PROT 5.9*  ALBUMIN 3.2*  AST 16  ALT 10  ALKPHOS 67  BILITOT 0.5    Studies/Results: CT ABDOMEN PELVIS W CONTRAST  Result Date: 07/22/2021 CLINICAL DATA:  Pain upper abdomen EXAM: CT ABDOMEN AND PELVIS WITH CONTRAST TECHNIQUE: Multidetector CT imaging of the abdomen and pelvis was performed using the standard protocol following bolus administration of intravenous contrast. CONTRAST:  4mL OMNIPAQUE IOHEXOL 350 MG/ML SOLN COMPARISON:  08/12/2010 FINDINGS: Lower chest: Unremarkable Hepatobiliary: Liver measures 13.2 cm. There is fatty infiltration. In image 11 of series 2, there is 6 mm low-density in the left lobe, possibly a cyst. In image 14, there is 9 mm possible cyst in the liver. There are multiple gallbladder stones. There is no dilation of bile ducts. Pancreas: No focal abnormality is seen Spleen: Unremarkable. Adrenals/Urinary Tract: Adrenals are not enlarged. There is no hydronephrosis. There are no renal or ureteral stones. Urinary bladder is unremarkable. Stomach/Bowel: Stomach is unremarkable. Small bowel loops are not dilated. Appendix is not seen. There is no pericecal inflammation. Moderate to large amount of stool  is seen in ascending, transverse and descending colon. There is no wall thickening or pericolic stranding. Vascular/Lymphatic: Atherosclerotic plaques and calcifications are seen in aorta and its major branches. Reproductive: Unremarkable Other: There is no ascites or pneumoperitoneum Musculoskeletal: Degenerative changes are noted in lower thoracic spine and lumbar spine. Dextroscoliosis is seen in the lumbar spine. IMPRESSION: There is no evidence intestinal obstruction or pneumoperitoneum. There is no hydronephrosis. Gallbladder stones. There is no dilation of bile ducts. Other findings as described in the body of the report. Electronically Signed   By: Elmer Picker M.D.   On: 07/22/2021 16:53   US Abdomen Limited RUQ (LIVER/GB)  Result Date: 07/22/2021 CLINICAL DATA:  Right upper quadrant pain. EXAM: ULTRASOUND ABDOMEN LIMITED RIGHT UPPER QUADRANT COMPARISON:  None. CT abdomen and pelvis 07/22/2021. FINDINGS: Gallbladder: Gallstones are present. The largest gallstone measures 1.2 cm. No sonographic Murphy sign noted by sonographer. Common bile duct: Diameter: 3 mm Liver: In the left lobe of the liver there is a 9 x 8 x 9 mm simple cyst. Within normal limits in parenchymal echogenicity. Portal vein is patent on color Doppler imaging with normal direction of blood flow towards the liver. Other: None. IMPRESSION: 1. Cholelithiasis. No additional sonographic evidence for acute cholecystitis. 2. Small hepatic cyst. Electronically Signed   By: Warren Lacy  Dagoberto Reef M.D.   On: 07/22/2021 19:12    IMPRESSION:  *Epigastric abdominal pain:  Pain started about a week ago.  No similar pain in the past.  Imaging negative for any acute issues.  ? Ulcer disease from NSAID use, etc. *NSAID use:  Uses Aleve daily for a long time. *Blood loss anemia/iron deficiency with Hemoccult positive stools: Hemoglobin is 13.1 g 6 months ago.  Three days ago it was 9.8 g and now is down to 8.2 g today.  PLAN: -Agree with  pantoprazole 40 mg IV twice daily for now. -Monitor hemoglobin and transfuse as needed. -Will discuss with Dr. Fuller Plan regarding endoscopic evaluation.  She likely will at least get and EGD.  She is due for colonoscopy this year, but may just start with EGD and if something is found could possible defer colonoscopy as outpatient. -Clear liquids for now until endoscopic plans are confirmed.  Laban Emperor. Zehr  07/23/2021, 9:31 AM     Attending Physician Note   I have taken a history, reviewed the chart and examined the patient. I personally saw the patient and performed a substantive portion of this encounter, including a complete performance of at least one of the key components, in conjunction with the APP. I agree with the APP's note, impression and recommendations.   *Epigastric pain, early satiety, Aleve use, heme + stool, iron deficiency anemia and cholelithiasis.  R/O ulcer, gastritis, neoplasm. Cholelithiasis does not appear to be the cause of her epigastric pain.   *EGD tomorrow. Colonoscopy timing to further evaluate IDA and heme + stool will be determined by EGD findings. IV pantoprazole bid. Avoid NSAIDs.    Lucio Edward, MD [facility], [facility], for our on call provider

## 2021-07-23 NOTE — Assessment & Plan Note (Signed)
·   Replacing with potassium chloride °· Evaluating for concurrent hypomagnesemia  °· Monitoring potassium levels with serial chemistries. ° °

## 2021-07-23 NOTE — Assessment & Plan Note (Signed)
?   No evidence of acute asthma exacerbation ?? As needed bronchodilator therapy for shortness of breath and wheezing. ? ?

## 2021-07-23 NOTE — Consult Note (Addendum)
Referring Provider: Dr. Cyd Silence, [facility] Primary Care Physician:  Biagio Borg, MD Primary Gastroenterologist:  Dr. Fuller Plan  Reason for Consultation:  Anemia and heme positive stool  HPI: Sue Green is a 75 y.o. female with past medical history of hypertension, asthma, gastroesophageal reflux disease, arthritis, pylori stenosis, hypothyroidism who presents to [facility] emergency department with complaints of abdominal pain.   She tells me that the abdominal pain began about a week ago.  Very intense in her upper abdomen.  No nausea or vomiting.  No pain currently.  No dark or blood stools.  Says that she is on iron and sometimes they are very dark brown, but would not say black per se.    Lab studies notable for the following:  Hemoglobin was 13.1 g 6 months ago.  Three days ago was 9.8 g and now is down 8.2 g.  MCV is normal, actually slightly high.  BUN is normal.  Folate and vitamin B12 levels are normal.  Iron studies are low with a ferritin of 24, serum iron 26, iron saturation 8%.  She is Hemoccult positive.  Right upper quadrant abdominal ultrasound shows cholelithiasis and a small hepatic cyst.  CT of the abdomen and pelvis with contrast showed gallstones, but no biliary ductal dilatation.  There are other incidental findings, but nothing acute.  She does use NSAIDs in the form of Aleve regularly for quite some time.  08/18/2011 colonoscopy was normal.  Repeat recommended in 10 years.  EGD in December 2014 showed retained gastric solids and stenosis at the pylorus.  Follow-up upper GI with small bowel follow-through in December 2014 showed the following:  IMPRESSION:  1. Initially sluggish transit of barium from the stomach into the duodenum compatible with reported pyloric stenosis ; the pylorus was difficulty directly image due to its orientation on today's exam. No definite mass in this vicinity was observed. Gastric emptying was  subsequently somewhat brisk,  query transient pyloric spasm.  2. Lumbar scoliosis and spondylosis.     Past Medical History:  Diagnosis Date   Allergy    Anemia, iron deficiency 03/06/2014   Anxiety    Arthritis    Asthma    Chronic tension headaches    IN PAST   Depression    Fibromyalgia    GERD (gastroesophageal reflux disease)    Glaucoma     Per pt, she does not have glaucoma.   Hypertension    Internal hemorrhoids    Iron deficiency anemia    Osteopenia    Pyloric stenosis    Scoliosis    Thyroid disease    hypothyroidism    Past Surgical History:  Procedure Laterality Date   APPENDECTOMY  1975   SHOULDER ARTHROSCOPY  2001   rt shoulder   TONSILLECTOMY AND ADENOIDECTOMY     75 years old    Prior to Admission medications   Medication Sig Start Date End Date Taking? Authorizing Provider  ALEVE 220 MG tablet Take 220 mg by mouth in the morning and at bedtime.   Yes [provider]  AMBULATORY NON FORMULARY MEDICATION Nitroglycerine ointment 0.125 %  Apply a pea sized amount internally and on lesion three times daily for 6 weeks 07/08/21  Yes Lemmon, Lavone Nian, PA  Artificial Saliva (ACT DRY MOUTH) LOZG Use as directed 1 lozenge in the mouth or throat every 6 (six) hours as needed (for a dry mouth).   Yes [provider]  Ascorbic Acid (VITAMIN C) 500  MG tablet Take 500 mg by mouth daily.   Yes [provider]  busPIRone (BUSPAR) 10 MG tablet Take 10 mg by mouth 2 (two) times daily with a meal.   Yes [provider]  cyclobenzaprine (FLEXERIL) 10 MG tablet TAKE 1 TABLET BY MOUTH EVERY DAY Patient taking differently: Take 10 mg by mouth in the morning. 05/20/21  Yes Biagio Borg, MD  desvenlafaxine (PRISTIQ) 100 MG 24 hr tablet Take 100 mg by mouth daily.   Yes [provider]  Ferrous Sulfate (IRON) 28 MG TABS Take 28 mg by mouth daily with breakfast.   Yes [provider]  Methylcellulose, Laxative, (CITRUCEL PO) See admin instructions.  Mix 1 tablespoonful of powder into water and drink before breakfast every day   Yes [provider]  metoprolol succinate (TOPROL-XL) 50 MG 24 hr tablet TAKE ONE AND 1/2 TABS BY MOUTH IN THE MORNING Patient taking differently: Take 75 mg by mouth in the morning. 03/11/21  Yes Biagio Borg, MD  Misc Natural Products (OSTEO BI-FLEX TRIPLE STRENGTH) TABS Take 1 tablet by mouth in the morning and at bedtime.   Yes [provider]  montelukast (SINGULAIR) 10 MG tablet TAKE 1 TABLET BY MOUTH EVERY DAY Patient taking differently: Take 10 mg by mouth at bedtime. 03/11/21  Yes Biagio Borg, MD  Multiple Vitamins-Minerals (ONE-A-DAY WOMENS 50+ ADVANTAGE) TABS Take 1 tablet by mouth daily.   Yes [provider]  OCUVITE LUTEIN 25 25-5 MG CAPS Take 1 capsule by mouth daily.   Yes [provider]  polyethylene glycol powder (GLYCOLAX/MIRALAX) 17 GM/SCOOP powder Take 8.5 g by mouth See admin instructions. Mix 8.5 grams into 4-8 ounces of water and drink by mouth once a day   Yes [provider]  SYNTHROID 75 MCG tablet TAKE 1 [place] Patient taking differently: Take 75 mcg by mouth at bedtime. 05/12/21  Yes Biagio Borg, MD  TYLENOL 8 HOUR 650 MG CR tablet Take 650 mg by mouth in the morning and at bedtime.   Yes [provider]  Calcium-Vitamin D-Vitamin K [id number] MG-UNT-MCG CHEW Chew 1 tablet by mouth 2 (two) times daily. Patient not taking: Reported on 07/22/2021    [provider]  diclofenac sodium (VOLTAREN) 1 % GEL Apply 2 g topically 4 (four) times daily. 06/08/19   Biagio Borg, MD  Krill Oil 300 MG CAPS Take 300 mg by mouth daily.    [provider]  LORazepam (ATIVAN) 1 MG tablet Take 1 mg by mouth 2 (two) times daily as needed. Patient not taking: Reported on 07/22/2021    [provider]  pantoprazole (PROTONIX) 40 MG tablet TAKE 1 TABLET BY MOUTH EVERY DAY Patient not taking: Reported on 07/22/2021  01/03/21   Biagio Borg, MD  sodium fluoride (PREVIDENT 5000 PLUS) 1.1 % CREA dental cream Denta 5000 Plus 1.1 % cream  USE AS DIRECTED Patient not taking: No sig reported    [provider]    Current Facility-Administered Medications  Medication Dose Route Frequency Provider Last Rate Last Admin   acetaminophen (TYLENOL) tablet 650 mg  650 mg Oral Q6H PRN Shalhoub, Sherryll Burger, MD       Or   acetaminophen (TYLENOL) suppository 650 mg  650 mg Rectal Q6H PRN Shalhoub, Sherryll Burger, MD       albuterol (PROVENTIL) (2.5 MG/3ML) 0.083% nebulizer solution 2.5 mg  2.5 mg Nebulization Q4H PRN Shalhoub, Sherryll Burger, MD  hydrALAZINE (APRESOLINE) injection 10 mg  10 mg Intravenous Q6H PRN Shalhoub, Sherryll Burger, MD       levothyroxine (SYNTHROID) tablet 75 mcg  75 mcg Oral QHS Vernelle Emerald, MD   75 mcg at 07/23/21 0007   metoprolol succinate (TOPROL-XL) 24 hr tablet 75 mg  75 mg Oral Daily Vernelle Emerald, MD   75 mg at 07/23/21 0007   montelukast (SINGULAIR) tablet 10 mg  10 mg Oral QHS Vernelle Emerald, MD   10 mg at 07/23/21 0008   morphine 4 MG/ML injection 4 mg  4 mg Intravenous Once Vernelle Emerald, MD       ondansetron [facility]) tablet 4 mg  4 mg Oral Q6H PRN Vernelle Emerald, MD       Or   ondansetron [facility]) injection 4 mg  4 mg Intravenous Q6H PRN Shalhoub, Sherryll Burger, MD       pantoprazole (PROTONIX) injection 40 mg  40 mg Intravenous Q12H Vernelle Emerald, MD       venlafaxine XR (EFFEXOR-XR) 24 hr capsule 150 mg  150 mg Oral Q breakfast Shalhoub, Sherryll Burger, MD        Allergies as of 07/22/2021 - Review Complete 07/22/2021  Allergen Reaction Noted   Penicillins Hives 12/29/2010   Doxycycline Nausea Only 12/29/2010   Antihistamines, diphenhydramine-type Other (See Comments) 02/10/2016   Diphenhydramine Other (See Comments) 08/18/2011   Lorazepam Other (See Comments) 07/22/2021   Latex Rash and Other (See Comments) 09/04/2013    Family History  Problem Relation Age  of Onset   Diabetes Mother    Arthritis Mother    Cancer Father    Diabetes Father    Colon cancer Neg Hx     Social History   Socioeconomic History   Marital status: Divorced    Spouse name: Not on file   Number of children: Not on file   Years of education: 16   Highest education level: Not on file  Occupational History   Occupation: Retired  Tobacco Use   Smoking status: Never   Smokeless tobacco: Never  Substance and Sexual Activity   Alcohol use: No    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  Physical Exam: Vital signs in last 24 hours: Temp:  [97.9 F (36.6 C)-98.5 F (36.9 C)] 98.3 F (36.8 C) (11/03 0812) Pulse Rate:  [72-103] 72 (11/03 0812) Resp:  [16-19] 18 (11/03 0812) BP: (127-164)/(48-88) 136/85 (11/03 0812) SpO2:  [98 %-100 %] 100 % (11/03 0812) Last BM Date: 07/21/21 General:  Alert, Well-developed, well-nourished, pleasant and cooperative in NAD Head:  Normocephalic and atraumatic. Eyes:  Sclera clear, no icterus.  Conjunctiva pink. Ears:  Normal auditory acuity. Mouth:  No deformity or lesions.   Lungs:  Clear throughout to auscultation.  No wheezes, crackles, or rhonchi.  Heart:  Regular rate and rhythm; no murmurs, clicks, rubs, or  gallops. Abdomen:  Soft, non-distended.  BS present.  Non-tender. Rectal:  Looked externally as she reports recurrent issues with a "tear" near her anus, but nothing was noted today.  She says that it  seems to be healed recently.  Was heme positive per EDP. Msk:  Symmetrical without gross deformities. Pulses:  Normal pulses noted. Extremities:  Without clubbing or edema. Neurologic:  Alert and oriented x 4;  grossly normal neurologically. Skin:  Intact without significant lesions or rashes. Psych:  Alert and cooperative. Normal mood and affect.  Lab Results: Recent Labs    07/22/21 1356 07/22/21 2150 07/23/21 0435  WBC 10.3 10.4 9.9  HGB 9.4* 8.9* 8.2*  HCT 27.8* 26.3* 24.6*  PLT 471* 432* 379   BMET Recent Labs    07/20/21 1542 07/22/21 1356 07/23/21 0446  NA 137 134* 130*  K 4.2 3.4* 4.3  CL 99 101 101  CO2 29 26 21*  GLUCOSE 146* 117* 100*  BUN 12 20 14   CREATININE 0.62 0.49 0.52  CALCIUM 9.5 9.1 8.3*   LFT Recent Labs    07/23/21 0446  PROT 5.9*  ALBUMIN 3.2*  AST 16  ALT 10  ALKPHOS 67  BILITOT 0.5    Studies/Results: CT ABDOMEN PELVIS W CONTRAST  Result Date: 07/22/2021 CLINICAL DATA:  Pain upper abdomen EXAM: CT ABDOMEN AND PELVIS WITH CONTRAST TECHNIQUE: Multidetector CT imaging of the abdomen and pelvis was performed using the standard protocol following bolus administration of intravenous contrast. CONTRAST:  71mL OMNIPAQUE IOHEXOL 350 MG/ML SOLN COMPARISON:  08/12/2010 FINDINGS: Lower chest: Unremarkable Hepatobiliary: Liver measures 13.2 cm. There is fatty infiltration. In image 11 of series 2, there is 6 mm low-density in the left lobe, possibly a cyst. In image 14, there is 9 mm possible cyst in the liver. There are multiple gallbladder stones. There is no dilation of bile ducts. Pancreas: No focal abnormality is seen Spleen: Unremarkable. Adrenals/Urinary Tract: Adrenals are not enlarged. There is no hydronephrosis. There are no renal or ureteral stones. Urinary bladder is unremarkable. Stomach/Bowel: Stomach is unremarkable. Small bowel loops are not dilated. Appendix is not seen. There is no pericecal inflammation. Moderate to large amount of stool  is seen in ascending, transverse and descending colon. There is no wall thickening or pericolic stranding. Vascular/Lymphatic: Atherosclerotic plaques and calcifications are seen in aorta and its major branches. Reproductive: Unremarkable Other: There is no ascites or pneumoperitoneum Musculoskeletal: Degenerative changes are noted in lower thoracic spine and lumbar spine. Dextroscoliosis is seen in the lumbar spine. IMPRESSION: There is no evidence intestinal obstruction or pneumoperitoneum. There is no hydronephrosis. Gallbladder stones. There is no dilation of bile ducts. Other findings as described in the body of the report. Electronically Signed   By: Elmer Picker M.D.   On: 07/22/2021 16:53   US Abdomen Limited RUQ (LIVER/GB)  Result Date: 07/22/2021 CLINICAL DATA:  Right upper quadrant pain. EXAM: ULTRASOUND ABDOMEN LIMITED RIGHT UPPER QUADRANT COMPARISON:  None. CT abdomen and pelvis 07/22/2021. FINDINGS: Gallbladder: Gallstones are present. The largest gallstone measures 1.2 cm. No sonographic Murphy sign noted by sonographer. Common bile duct: Diameter: 3 mm Liver: In the left lobe of the liver there is a 9 x 8 x 9 mm simple cyst. Within normal limits in parenchymal echogenicity. Portal vein is patent on color Doppler imaging with normal direction of blood flow towards the liver. Other: None. IMPRESSION: 1. Cholelithiasis. No additional sonographic evidence for acute cholecystitis. 2. Small hepatic cyst. Electronically Signed   By: Warren Lacy  Dagoberto Reef M.D.   On: 07/22/2021 19:12    IMPRESSION:  *Epigastric abdominal pain:  Pain started about a week ago.  No similar pain in the past.  Imaging negative for any acute issues.  ? Ulcer disease from NSAID use, etc. *NSAID use:  Uses Aleve daily for a long time. *Blood loss anemia/iron deficiency with Hemoccult positive stools: Hemoglobin is 13.1 g 6 months ago.  Three days ago it was 9.8 g and now is down to 8.2 g today.  PLAN: -Agree with  pantoprazole 40 mg IV twice daily for now. -Monitor hemoglobin and transfuse as needed. -Will discuss with Dr. Fuller Plan regarding endoscopic evaluation.  She likely will at least get and EGD.  She is due for colonoscopy this year, but may just start with EGD and if something is found could possible defer colonoscopy as outpatient. -Clear liquids for now until endoscopic plans are confirmed.  Laban Emperor. Zehr  07/23/2021, 9:31 AM     Attending Physician Note   I have taken a history, reviewed the chart and examined the patient. I personally saw the patient and performed a substantive portion of this encounter, including a complete performance of at least one of the key components, in conjunction with the APP. I agree with the APP's note, impression and recommendations.   *Epigastric pain, early satiety, Aleve use, heme + stool, iron deficiency anemia and cholelithiasis.  R/O ulcer, gastritis, neoplasm. Cholelithiasis does not appear to be the cause of her epigastric pain.   *EGD tomorrow. Colonoscopy timing to further evaluate IDA and heme + stool will be determined by EGD findings. IV pantoprazole bid. Avoid NSAIDs.    Lucio Edward, MD [facility] See AMION, [facility], for our on call provider

## 2021-07-23 NOTE — Progress Notes (Signed)
Sue Green  NOB:[id number] DOB: March 07, 1946 DOA: 07/22/2021 PCP: Biagio Borg, MD    Brief Narrative:  75 year old with a history of HTN, asthma, GERD, and hypothyroidism who presented to the ED with complaints of epigastric abdominal pain of increasing intensity x1 week.  This is been associated with very poor oral intake due to loss of appetite.  In the ED she was found to have a hemoglobin of 9.4 compared to a baseline of 13 within the last 2 months.  The patient was Hemoccult positive on rectal exam but there was no evidence of gross blood.  CT imaging of the abdomen/pelvis and ultrasound noted cholelithiasis but no evidence of acute cholecystitis.  Consultants:  [facility]   Code Status: FULL CODE  Antimicrobials:  None  DVT prophylaxis: SCDs  Subjective: The patient is quite emotional.  She reports ongoing epigastric pain.  She denies chest pain or shortness of breath.  She seems fixated on the idea that she was told she has gallstones but that "now they are telling me I have a bleeding ulcer".  I have tried to explain the concept of a differential diagnosis and involving evaluation.  The patient agrees to an EGD tomorrow by GI.  Assessment & Plan:  Acute versus subacute upper GI bleeding [facility] following -continue PPI -possibly related to NSAID use -for EGD in a.m.  Acute versus subacute blood loss anemia Likely due to GI bleed -no evidence of precipitous acute drop and likely reflective of a slow gradual leaking -for GI eval as noted above -monitor trend -no indication for transfusion thus far  Cholelithiasis No evidence on exam or imaging of acute cholecystitis and therefore likely an incidental finding  Hypokalemia Due to poor oral intake -supplement as needed to goal of 4.0  HTN Continue usual home medication  GERD On PPI as noted above  Hypothyroidism Continue usual home Synthroid regimen   Family Communication: No family present at time of  exam Status is: Observation    Objective: Blood pressure (!) 124/50, pulse 76, temperature 97.8 F (36.6 C), temperature source Oral, resp. rate 18, SpO2 99 %.  Intake/Output Summary (Last 24 hours) at 07/23/2021 1612 Last data filed at 07/23/2021 1414 Gross per 24 hour  Intake 236 ml  Output --  Net 236 ml   There were no vitals filed for this visit.  Examination: General: No acute respiratory distress Lungs: Clear to auscultation bilaterally without wheezes or crackles Cardiovascular: Regular rate and rhythm without murmur gallop or rub normal S1 and S2 Abdomen: Nontender, nondistended, soft, bowel sounds positive, no rebound, no ascites, no appreciable mass Extremities: No significant cyanosis, clubbing, or edema bilateral lower extremities  CBC: Recent Labs  Lab 07/22/21 2150 07/23/21 0435 07/23/21 0959  WBC 10.4 9.9 7.2  HGB 8.9* 8.2* 8.3*  HCT 26.3* 24.6* 25.1*  MCV 99.6 100.8* 100.0  PLT 432* 379 947*   Basic Metabolic Panel: Recent Labs  Lab 07/20/21 1542 07/22/21 1356 07/23/21 0446  NA 137 134* 130*  K 4.2 3.4* 4.3  CL 99 101 101  CO2 29 26 21*  GLUCOSE 146* 117* 100*  BUN 12 20 14   CREATININE 0.62 0.49 0.52  CALCIUM 9.5 9.1 8.3*  MG  --   --  2.2   GFR: CrCl cannot be calculated (Unknown ideal weight.).  Liver Function Tests: Recent Labs  Lab 07/20/21 1542 07/22/21 1356 07/23/21 0446  AST 15 15 16   ALT 11 12 10   ALKPHOS 78 78 67  BILITOT 0.3 0.4 0.5  PROT 6.3* 6.9 5.9*  ALBUMIN 3.4* 3.6 3.2*   Recent Labs  Lab 07/20/21 1542 07/22/21 1356  LIPASE 29 28     Recent Results (from the past 240 hour(s))  Resp Panel by RT-PCR (Flu A&B, Covid) Nasopharyngeal Swab     Status: None   Collection Time: 07/22/21  9:50 PM   Specimen: Nasopharyngeal Swab; Nasopharyngeal(NP) swabs in vial transport medium  Result Value Ref Range Status   SARS Coronavirus 2 by RT PCR NEGATIVE NEGATIVE Final    Comment: (NOTE) SARS-CoV-2 target nucleic acids  are NOT DETECTED.  The SARS-CoV-2 RNA is generally detectable in upper respiratory specimens during the acute phase of infection. The lowest concentration of SARS-CoV-2 viral copies this assay can detect is 138 copies/mL. A negative result does not preclude SARS-Cov-2 infection and should not be used as the sole basis for treatment or other patient management decisions. A negative result may occur with  improper specimen collection/handling, submission of specimen other than nasopharyngeal swab, presence of viral mutation(s) within the areas targeted by this assay, and inadequate number of viral copies(<138 copies/mL). A negative result must be combined with clinical observations, patient history, and epidemiological information. The expected result is Negative.  Fact Sheet for Patients:  EntrepreneurPulse.com.au  Fact Sheet for Healthcare Providers:  IncredibleEmployment.be  This test is no t yet approved or cleared by the [place] FDA and  has been authorized for detection and/or diagnosis of SARS-CoV-2 by FDA under an Emergency Use Authorization (EUA). This EUA will remain  in effect (meaning this test can be used) for the duration of the COVID-19 declaration under Section 564(b)(1) of the Act, 21 U.S.C.section 360bbb-3(b)(1), unless the authorization is terminated  or revoked sooner.       Influenza A by PCR NEGATIVE NEGATIVE Final   Influenza B by PCR NEGATIVE NEGATIVE Final    Comment: (NOTE) The Xpert Xpress SARS-CoV-2/FLU/RSV plus assay is intended as an aid in the diagnosis of influenza from Nasopharyngeal swab specimens and should not be used as a sole basis for treatment. Nasal washings and aspirates are unacceptable for Xpert Xpress SARS-CoV-2/FLU/RSV testing.  Fact Sheet for Patients: EntrepreneurPulse.com.au  Fact Sheet for Healthcare Providers: IncredibleEmployment.be  This test is  not yet approved or cleared by the [place] FDA and has been authorized for detection and/or diagnosis of SARS-CoV-2 by FDA under an Emergency Use Authorization (EUA). This EUA will remain in effect (meaning this test can be used) for the duration of the COVID-19 declaration under Section 564(b)(1) of the Act, 21 U.S.C. section 360bbb-3(b)(1), unless the authorization is terminated or revoked.  Performed at [facility], [place] [address], [place],  [address]      Scheduled Meds:  levothyroxine  75 mcg Oral QHS   metoprolol succinate  75 mg Oral Daily   montelukast  10 mg Oral QHS    morphine injection  4 mg Intravenous Once   pantoprazole (PROTONIX) IV  40 mg Intravenous Q12H   venlafaxine XR  150 mg Oral Q breakfast      LOS: 0 days   Cherene Altes, MD [facility] Office  [phone] Pager - Text Page per Shea Evans  If 7PM-7AM, please contact night-coverage per Amion 07/23/2021, 4:12 PM

## 2021-07-23 NOTE — Assessment & Plan Note (Addendum)
   Patient presenting with a drop in hemoglobin from 13.1 several months ago to 9.4 on presentation today  This is in the setting of severe epigastric pain and tenderness on exam  Patient endorses a longstanding history of NSAID use using naproxen twice daily for many months to years  Concern for NSAID induced gastropathy  Cycling CBCs every 6 hours  Initiating intravenous PPI every 12 hours  Type and screen  We will transfuse if hemoglobin drops below 7  Discussed case with [facility] -patient unlikely to go for EGD on 11/3 (maybe 11/4) and therefore we will keep patient on clears with diet for now until they can see patient in consultation.

## 2021-07-23 NOTE — Progress Notes (Signed)
After patient refused to eat dinner tray, patient stated she was "starving."  Patient provided with available floor stock soft diet options:  [place] sandwich, ice cream and cola.  Patient still upset about the "terrible food." It was explained that the kitchen was closed and that was available.  Patient very upset, oncoming nurse and charge nurse made aware.

## 2021-07-23 NOTE — Assessment & Plan Note (Signed)
.   Resume home regimen of Synthroid 

## 2021-07-23 NOTE — Assessment & Plan Note (Addendum)
   CT and ultrasound imaging nonrevealing as to the etiology of patient's pain  Considering patient's epigastric tenderness and drop in hemoglobin I am concerned about NSAID induced gastropathy/gastritis or ulcer as the etiology  See remainder of assessment and plan as above.

## 2021-07-23 NOTE — Assessment & Plan Note (Signed)
   Placed on intravenous proton pump inhibitor as noted above.

## 2021-07-24 ENCOUNTER — Encounter (HOSPITAL_COMMUNITY)
Admission: EM | Disposition: A | Discharge status: Home / Self Care | Payer: Self-pay | Source: Home / Self Care | Attending: Emergency Medicine

## 2021-07-24 ENCOUNTER — Encounter (HOSPITAL_COMMUNITY): Payer: Self-pay | Admitting: Internal Medicine

## 2021-07-24 DIAGNOSIS — K315 Obstruction of duodenum: Secondary | ICD-10-CM | POA: Diagnosis not present

## 2021-07-24 DIAGNOSIS — K922 Gastrointestinal hemorrhage, unspecified: Secondary | ICD-10-CM | POA: Diagnosis not present

## 2021-07-24 DIAGNOSIS — K269 Duodenal ulcer, unspecified as acute or chronic, without hemorrhage or perforation: Secondary | ICD-10-CM | POA: Diagnosis not present

## 2021-07-24 DIAGNOSIS — R195 Other fecal abnormalities: Secondary | ICD-10-CM | POA: Diagnosis present

## 2021-07-24 HISTORY — PX: ESOPHAGOGASTRODUODENOSCOPY (EGD) WITH PROPOFOL: SHX5813

## 2021-07-24 HISTORY — PX: BIOPSY: SHX5522

## 2021-07-24 SURGERY — ESOPHAGOGASTRODUODENOSCOPY (EGD) WITH PROPOFOL
Anesthesia: Moderate Sedation

## 2021-07-24 MED ORDER — FENTANYL CITRATE (PF) 100 MCG/2ML IJ SOLN
INTRAMUSCULAR | Status: DC | PRN
  Administered 2021-07-24 (×2): 25 ug via INTRAVENOUS

## 2021-07-24 MED ORDER — FENTANYL CITRATE (PF) 100 MCG/2ML IJ SOLN
INTRAMUSCULAR | Status: AC
  Filled 2021-07-24: qty 4

## 2021-07-24 MED ORDER — MIDAZOLAM HCL (PF) 5 MG/ML IJ SOLN
INTRAMUSCULAR | Status: AC
  Filled 2021-07-24: qty 2

## 2021-07-24 MED ORDER — MIDAZOLAM HCL (PF) 5 MG/ML IJ SOLN
INTRAMUSCULAR | Status: DC | PRN
  Administered 2021-07-24: 2 mg via INTRAVENOUS
  Administered 2021-07-24: 1 mg via INTRAVENOUS

## 2021-07-24 SURGICAL SUPPLY — 14 items

## 2021-07-24 NOTE — Progress Notes (Signed)
Sue Green  EXB:[id number] DOB: February 24, 1946 DOA: 07/22/2021 PCP: Biagio Borg, MD    Brief Narrative:  75 year old with a history of HTN, asthma, GERD, and hypothyroidism who presented to the ED with complaints of epigastric abdominal pain of increasing intensity x1 week.  This is been associated with very poor oral intake due to loss of appetite.  In the ED she was found to have a hemoglobin of 9.4 compared to a baseline of 13 within the last 2 months.  The patient was Hemoccult positive on rectal exam but there was no evidence of gross blood.  CT imaging of the abdomen/pelvis and ultrasound noted cholelithiasis but no evidence of acute cholecystitis.  Consultants:  [facility]   Code Status: FULL CODE  Antimicrobials:  None  DVT prophylaxis: SCDs  Subjective: Afebrile.  Vital signs stable.  Hemoglobin stable at present. No new complaints today.   Assessment & Plan:  Acute versus subacute upper GI bleeding [facility] following -continue PPI -possibly related to NSAID use - for EGD today   Acute versus subacute blood loss anemia Likely due to GI bleed -no evidence of precipitous acute drop and likely reflective of a slow gradual leaking -for GI eval as noted above -monitor trend -no indication for transfusion thus far  Cholelithiasis No evidence on exam or imaging of acute cholecystitis and therefore likely an incidental finding  Hypokalemia Due to poor oral intake -corrected with supplementation  HTN Continue usual home medication -blood pressure reasonably controlled  GERD On PPI as noted above  Hypothyroidism Continue usual home Synthroid regimen   Family Communication: No family present at time of exam Status is: Observation    Objective: Blood pressure (!) 137/56, pulse 91, temperature 98.1 F (36.7 C), temperature source Oral, resp. rate 20, SpO2 100 %.  Intake/Output Summary (Last 24 hours) at 07/24/2021 0848 Last data filed at 07/23/2021 1414 Gross  per 24 hour  Intake 236 ml  Output --  Net 236 ml    There were no vitals filed for this visit.  Examination: General: No acute respiratory distress Lungs: Clear to auscultation bilaterally without wheezes or crackles Cardiovascular: Regular rate and rhythm without murmur gallop or rub normal S1 and S2 Abdomen: Nontender, nondistended, soft, bowel sounds positive, no rebound, no ascites, no appreciable mass Extremities: No significant cyanosis, clubbing, or edema bilateral lower extremities  CBC: Recent Labs  Lab 07/22/21 2150 07/23/21 0435 07/23/21 0959  WBC 10.4 9.9 7.2  HGB 8.9* 8.2* 8.3*  HCT 26.3* 24.6* 25.1*  MCV 99.6 100.8* 100.0  PLT 432* 379 402*    Basic Metabolic Panel: Recent Labs  Lab 07/20/21 1542 07/22/21 1356 07/23/21 0446  NA 137 134* 130*  K 4.2 3.4* 4.3  CL 99 101 101  CO2 29 26 21*  GLUCOSE 146* 117* 100*  BUN 12 20 14   CREATININE 0.62 0.49 0.52  CALCIUM 9.5 9.1 8.3*  MG  --   --  2.2    GFR: CrCl cannot be calculated (Unknown ideal weight.).  Liver Function Tests: Recent Labs  Lab 07/20/21 1542 07/22/21 1356 07/23/21 0446  AST 15 15 16   ALT 11 12 10   ALKPHOS 78 78 67  BILITOT 0.3 0.4 0.5  PROT 6.3* 6.9 5.9*  ALBUMIN 3.4* 3.6 3.2*    Recent Labs  Lab 07/20/21 1542 07/22/21 1356  LIPASE 29 28      Recent Results (from the past 240 hour(s))  Resp Panel by RT-PCR (Flu A&B, Covid) Nasopharyngeal Swab  Status: None   Collection Time: 07/22/21  9:50 PM   Specimen: Nasopharyngeal Swab; Nasopharyngeal(NP) swabs in vial transport medium  Result Value Ref Range Status   SARS Coronavirus 2 by RT PCR NEGATIVE NEGATIVE Final    Comment: (NOTE) SARS-CoV-2 target nucleic acids are NOT DETECTED.  The SARS-CoV-2 RNA is generally detectable in upper respiratory specimens during the acute phase of infection. The lowest concentration of SARS-CoV-2 viral copies this assay can detect is 138 copies/mL. A negative result does not  preclude SARS-Cov-2 infection and should not be used as the sole basis for treatment or other patient management decisions. A negative result may occur with  improper specimen collection/handling, submission of specimen other than nasopharyngeal swab, presence of viral mutation(s) within the areas targeted by this assay, and inadequate number of viral copies(<138 copies/mL). A negative result must be combined with clinical observations, patient history, and epidemiological information. The expected result is Negative.  Fact Sheet for Patients:  EntrepreneurPulse.com.au  Fact Sheet for Healthcare Providers:  IncredibleEmployment.be  This test is no t yet approved or cleared by the [place] FDA and  has been authorized for detection and/or diagnosis of SARS-CoV-2 by FDA under an Emergency Use Authorization (EUA). This EUA will remain  in effect (meaning this test can be used) for the duration of the COVID-19 declaration under Section 564(b)(1) of the Act, 21 U.S.C.section 360bbb-3(b)(1), unless the authorization is terminated  or revoked sooner.       Influenza A by PCR NEGATIVE NEGATIVE Final   Influenza B by PCR NEGATIVE NEGATIVE Final    Comment: (NOTE) The Xpert Xpress SARS-CoV-2/FLU/RSV plus assay is intended as an aid in the diagnosis of influenza from Nasopharyngeal swab specimens and should not be used as a sole basis for treatment. Nasal washings and aspirates are unacceptable for Xpert Xpress SARS-CoV-2/FLU/RSV testing.  Fact Sheet for Patients: EntrepreneurPulse.com.au  Fact Sheet for Healthcare Providers: IncredibleEmployment.be  This test is not yet approved or cleared by the [place] FDA and has been authorized for detection and/or diagnosis of SARS-CoV-2 by FDA under an Emergency Use Authorization (EUA). This EUA will remain in effect (meaning this test can be used) for the  duration of the COVID-19 declaration under Section 564(b)(1) of the Act, 21 U.S.C. section 360bbb-3(b)(1), unless the authorization is terminated or revoked.  Performed at [facility], [place] [address], [place], [place] [address]       Scheduled Meds:  levothyroxine  75 mcg Oral QHS   metoprolol succinate  75 mg Oral Daily   montelukast  10 mg Oral QHS   pantoprazole (PROTONIX) IV  40 mg Intravenous Q12H   venlafaxine XR  150 mg Oral Q breakfast      LOS: 0 days   Cherene Altes, MD [facility] Office  [phone] Pager - Text Page per Shea Evans  If 7PM-7AM, please contact night-coverage per Amion 07/24/2021, 8:48 AM

## 2021-07-24 NOTE — Op Note (Addendum)
[facility] Patient Name: Sue Green Procedure Date: 07/24/2021 MRN: [id number] Attending MD: Ladene Artist , MD Date of Birth: Jun 22, 1946 CSN: [id number] Age: 75 Admit Type: Inpatient Procedure:                Upper GI endoscopy Indications:              Epigastric abdominal pain, Iron deficiency anemia,                            Heme positive stool Providers:                Pricilla Riffle. Fuller Plan, MD, Dulcy Fanny, Particia Nearing, RN, Tyna Jaksch Technician Referring MD:             [facility] Medicines:                Fentanyl 50 micrograms IV, Midazolam 3 mg IV Complications:            No immediate complications. Estimated Blood Loss:     Estimated blood loss was minimal. Procedure:                Pre-Anesthesia Assessment:                           - Prior to the procedure, a History and Physical                            was performed, and patient medications and                            allergies were reviewed. The patient's tolerance of                            previous anesthesia was also reviewed. The risks                            and benefits of the procedure and the sedation                            options and risks were discussed with the patient.                            All questions were answered, and informed consent                            was obtained. Prior Anticoagulants: The patient has                            taken no previous anticoagulant or antiplatelet                            agents. ASA Grade Assessment: III - A patient with  severe systemic disease. After reviewing the risks                            and benefits, the patient was deemed in                            satisfactory condition to undergo the procedure.                           After obtaining informed consent, the endoscope was                            passed under direct vision. Throughout the                             procedure, the patient's blood pressure, pulse, and                            oxygen saturations were monitored continuously. The                            [id number]-H190 ([id number]) Olympus endoscope was introduced                            through the mouth, and advanced to the duodenal                            bulb. The upper GI endoscopy was accomplished                            without difficulty. The patient tolerated the                            procedure well. Scope In: Scope Out: Findings:      The examined esophagus was normal.      Patchy mildly erythematous mucosa without bleeding was found on the       greater curvature of the stomach. Biopsies were taken with a cold       forceps for histology.      The exam of the stomach was otherwise normal.      One non-bleeding cratered duodenal ulcer with a flat pigmented spot       (Forrest Class IIc) was found in the duodenal bulb. Ulcer margins were       friable. The lesion was 15 mm in largest dimension.      An acquired benign-appearing, intrinsic moderate stenosis was found in       the duodenal bulb (D1/D2 junction) and was non-traversed. Limited       keyhole views of the descending duodenum at the stenosis appeared normal. Impression:               - Normal esophagus.                           - Erythematous mucosa in the greater curvature.  Biopsied.                           - Non-bleeding large duodenal ulcer with flat                            pigmented spot and friable margins.                           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 Moderate Sedation:      Moderate (conscious) sedation was administered by the endoscopy nurse       and supervised by the endoscopist. The following parameters were       monitored: oxygen saturation, heart rate, blood pressure, respiratory       rate, EKG, adequacy of pulmonary ventilation, and response to care.        Total physician intraservice time was 11 minutes. Recommendation:           - Return patient to hospital ward for ongoing care.                           - Soft diet today.                           - Continue present medications.                           - Protonix (pantoprazole) 40 mg PO BID for 2 months                            then 40 mg po qd.                           - No aspirin, ibuprofen, naproxen, or other                            non-steroidal anti-inflammatory drugs long term.                           - Await pathology results.                           - Perform a colonoscopy as an outpatient in about 1                            month. Procedure Code(s):        --- Professional ---                           [phone], Esophagogastroduodenoscopy, flexible,                            transoral; with biopsy, single or multiple                           G0500, Moderate sedation services provided by the  same physician or other qualified health care                            professional performing a gastrointestinal                            endoscopic service that sedation supports,                            requiring the presence of an independent trained                            observer to assist in the monitoring of the                            patient's level of consciousness and physiological                            status; initial 15 minutes of intra-service time;                            patient age 78 years or older (additional time may                            be reported with [phone], as appropriate) Diagnosis Code(s):        --- Professional ---                           K31.89, Other diseases of stomach and duodenum                           K26.9, Duodenal ulcer, unspecified as acute or                            chronic, without hemorrhage or perforation                           K31.5, Obstruction of duodenum                            R10.13, Epigastric pain                           D50.9, Iron deficiency anemia, unspecified                           R19.5, Other fecal abnormalities CPT copyright 2019 American Medical Association. All rights reserved. The codes documented in this report are preliminary and upon coder review may  be revised to meet current compliance requirements. Ladene Artist, MD 07/24/2021 5:45:33 PM This report has been signed electronically. Number of Addenda: 0

## 2021-07-24 NOTE — Progress Notes (Signed)
Lab tech informed this nurse that pt was drinking water when she attempted blood draw for scheduled labs but pt refused. I presented to room and pt had cups on bedside table. I attempted to explain to patient that she is to be nothing by mouth for EDG and that she can not have anything on her stomach because it can cause complications such as aspiration. AMION page sent to [facility] Alonza Bogus to inform her of pt refusal to stay NPO. Patient is very tearful states that she misses her cats, and that she can not go without something to drink.

## 2021-07-24 NOTE — Interval H&P Note (Signed)
History and Physical Interval Note:  07/24/2021 5:21 PM  Sue Green  has presented today for surgery, with the diagnosis of Anemia, epigastric pain, NSAID use.  The various methods of treatment have been discussed with the patient and family. After consideration of risks, benefits and other options for treatment, the patient has consented to  Procedure(s): ESOPHAGOGASTRODUODENOSCOPY (EGD) WITH PROPOFOL (N/A) as a surgical intervention.  The patient's history has been reviewed, patient examined, no change in status, stable for surgery.  I have reviewed the patient's chart and labs.  Questions were answered to the patient's satisfaction.     Pricilla Riffle. Fuller Plan

## 2021-07-25 DIAGNOSIS — K269 Duodenal ulcer, unspecified as acute or chronic, without hemorrhage or perforation: Secondary | ICD-10-CM | POA: Diagnosis not present

## 2021-07-25 DIAGNOSIS — K922 Gastrointestinal hemorrhage, unspecified: Secondary | ICD-10-CM | POA: Diagnosis not present

## 2021-07-25 DIAGNOSIS — R195 Other fecal abnormalities: Secondary | ICD-10-CM | POA: Diagnosis not present

## 2021-07-25 DIAGNOSIS — D500 Iron deficiency anemia secondary to blood loss (chronic): Secondary | ICD-10-CM | POA: Diagnosis not present

## 2021-07-25 DIAGNOSIS — R1013 Epigastric pain: Secondary | ICD-10-CM | POA: Diagnosis not present

## 2021-07-25 LAB — CBC
HCT: 23.7 % — ABNORMAL LOW (ref 36.0–46.0)
Hemoglobin: 8.1 g/dL — ABNORMAL LOW (ref 12.0–15.0)
MCH: 33.6 pg (ref 26.0–34.0)
MCHC: 34.2 g/dL (ref 30.0–36.0)
MCV: 98.3 fL (ref 80.0–100.0)
Platelets: 439 10*3/uL — ABNORMAL HIGH (ref 150–400)
RBC: 2.41 MIL/uL — ABNORMAL LOW (ref 3.87–5.11)
RDW: 12.7 % (ref 11.5–15.5)
WBC: 7.2 10*3/uL (ref 4.0–10.5)
nRBC: 0 % (ref 0.0–0.2)

## 2021-07-25 MED ORDER — LIP MEDEX EX OINT
TOPICAL_OINTMENT | CUTANEOUS | Status: DC | PRN
  Administered 2021-07-25: 75 via TOPICAL
  Filled 2021-07-25: qty 7

## 2021-07-25 MED ORDER — PHENOL 1.4 % MT LIQD
1.0000 | OROMUCOSAL | Status: DC | PRN
  Administered 2021-07-25: 1 via OROMUCOSAL
  Filled 2021-07-25: qty 177

## 2021-07-25 MED ORDER — PANTOPRAZOLE SODIUM 40 MG PO TBEC: 40.0000 mg | DELAYED_RELEASE_TABLET | Freq: Two times a day (BID) | ORAL | 0 refills | Status: DC

## 2021-07-25 MED ORDER — PANTOPRAZOLE SODIUM 40 MG PO TBEC
40.0000 mg | DELAYED_RELEASE_TABLET | Freq: Two times a day (BID) | ORAL | Status: DC
  Administered 2021-07-25: 40 mg via ORAL
  Filled 2021-07-25: qty 1

## 2021-07-25 MED ORDER — ACETAMINOPHEN 325 MG PO TABS: 650.0000 mg | ORAL_TABLET | Freq: Four times a day (QID) | ORAL | Status: AC | PRN

## 2021-07-25 NOTE — Progress Notes (Signed)
    Progress Note   Subjective  Mild sore throat, mild epigastric pain   Objective  Vital signs in last 24 hours: Temp:  [97.9 F (36.6 C)-98.3 F (36.8 C)] 98 F (36.7 C) (11/05 0620) Pulse Rate:  [72-88] 76 (11/05 0620) Resp:  [11-20] 20 (11/05 0620) BP: (106-188)/(41-74) 136/59 (11/05 0620) SpO2:  [98 %-100 %] 100 % (11/05 0620) Last BM Date: 07/24/21  General: Alert, well-developed, in NAD Heart:  Regular rate and rhythm; no murmurs Chest: Clear to ascultation bilaterally Abdomen:  Soft, nontender and nondistended. Normal bowel sounds, without guarding, and without rebound.   Extremities:  Without edema. Neurologic:  Alert and  oriented x4; grossly normal neurologically. Psych:  Alert and cooperative. Normal mood and affect.  Intake/Output from previous day: No intake/output data recorded. Intake/Output this shift: Total I/O In: 240 [P.O.:240] Out: -   Lab Results: Recent Labs    07/23/21 0435 07/23/21 0959 07/25/21 0536  WBC 9.9 7.2 7.2  HGB 8.2* 8.3* 8.1*  HCT 24.6* 25.1* 23.7*  PLT 379 402* 439*   BMET Recent Labs    07/22/21 1356 07/23/21 0446  NA 134* 130*  K 3.4* 4.3  CL 101 101  CO2 26 21*  GLUCOSE 117* 100*  BUN 20 14  CREATININE 0.49 0.52  CALCIUM 9.1 8.3*   LFT Recent Labs    07/23/21 0446  PROT 5.9*  ALBUMIN 3.2*  AST 16  ALT 10  ALKPHOS 67  BILITOT 0.5       Assessment & Recommendations  Large DU causing epigastric pain, IDA, heme + stool. Pantoprazole 40 mg po bid for 2 months then qd. No ASA/NSAIDs. Tylenol 500 mg po qid prn. FeSO4 325 mg po bid with food. We will contact her for GI office follow up and outpatient colonoscopy. Cholelithiasis, asymptomatic.   OK for discharge today. GI signing off.    LOS: 0 days   Sherrie Marsan T. Fuller Plan  07/25/2021, 9:40 AM See Shea Evans, [facility], to contact our on call provider

## 2021-07-25 NOTE — Progress Notes (Signed)
Patient alert/oriented and independent is refusing to wash up.

## 2021-07-25 NOTE — Discharge Summary (Signed)
DISCHARGE SUMMARY  Sue Green  MR#: [id number]  DOB:04-Oct-1945  Date of Admission: 07/22/2021 Date of Discharge: 07/25/2021  Attending Physician:Avyay Coger Hennie Duos, MD  Patient's VOZ:DGUY, Hunt Oris, MD  Consults: [facility]  Disposition: D/C home    Follow-up Appts:  Follow-up Information     Biagio Borg, MD. Schedule an appointment as soon as possible for a visit in 1 week(s).   Specialties: Internal Medicine, Radiology Contact information: [place] [address] [phone]         Ladene Artist, MD Follow up.   Specialty: Gastroenterology Why: The office will call you to arrange a follow up appointment. If you do not hear from the office within 3-4 days, call to schedule an appointment. Contact information: [address] [place] [address] [phone]                 Tests Needing Follow-up: -Follow-up gastric mucosa biopsy obtained during EGD -Outpatient colonoscopy in approximately 1 month -Recheck CBC -Assure patient abstaining from NSAID use  Discharge Diagnoses: Subacute upper GI bleeding Duodenal Ulcer  Acute blood loss anemia Cholelithiasis Hypokalemia HTN GERD Hypothyroidism    Initial presentation: 75yo with a history of HTN, asthma, GERD, and hypothyroidism who presented to the ED with complaints of epigastric abdominal pain of increasing intensity x1 week.  This had been associated with very poor oral intake due to loss of appetite.  In the ED she was found to have a hemoglobin of 9.4 compared to a baseline of 13 within the last 2 months.  The patient was hemoccult positive on rectal exam but there was no evidence of gross blood.  CT imaging of the abdomen/pelvis and ultrasound noted cholelithiasis but no evidence of acute cholecystitis.  Hospital Course:  Subacute upper GI bleeding - duodenal ulcer  [facility] followed - EGD noted the following:  - Normal esophagus. - Erythematous mucosa in  the greater curvature. Biopsied. - Non-bleeding large duodenal ulcer with flat pigmented spot and friable margins. 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  To continue BID PPI for 2 months, then reduce to QD - counseled on connection to NSAID use and need to avoid NSAIDs   Acute blood loss anemia due to GI bleed from duodenal ulcer - no evidence of precipitous acute drop and likely reflective of a slow gradual leaking -no active bleeding noted at time of EGD -hemoglobin stable   Cholelithiasis No evidence on exam or imaging of acute cholecystitis and therefore likely an incidental finding   Hypokalemia Due to poor oral intake -corrected with supplementation  HTN Continue usual home medication -blood pressure reasonably controlled   GERD On PPI as noted above   Hypothyroidism Continue usual home Synthroid regimen    Allergies as of 07/25/2021       Reactions   Penicillins Hives   Doxycycline Nausea Only   Antihistamines, Diphenhydramine-type Other (See Comments)   Reaction not recalled   Diphenhydramine Other (See Comments)   Reaction not recalled   Lorazepam Other (See Comments)   Caused trembling in the arms, per the patient   Latex Rash, Other (See Comments)   Patient disputes this in 2022        Medication List     STOP taking these medications    Aleve 220 MG tablet Generic drug: naproxen sodium   LORazepam 1 MG tablet Commonly known as: ATIVAN   Tylenol 8 Hour 650 MG CR tablet Generic drug: acetaminophen Replaced by:  acetaminophen 325 MG tablet       TAKE these medications    acetaminophen 325 MG tablet Commonly known as: TYLENOL Take 2 tablets (650 mg total) by mouth every 6 (six) hours as needed for mild pain (or Fever >/= 101). Replaces: Tylenol 8 Hour 650 MG CR tablet   ACT Dry Mouth Lozg Use as directed 1 lozenge in the mouth or throat every 6 (six) hours as needed (for a dry mouth).   AMBULATORY NON FORMULARY  MEDICATION Nitroglycerine ointment 0.125 %  Apply a pea sized amount internally and on lesion three times daily for 6 weeks   busPIRone 10 MG tablet Commonly known as: BUSPAR Take 10 mg by mouth 2 (two) times daily with a meal.   CITRUCEL PO See admin instructions. Mix 1 tablespoonful of powder into water and drink before breakfast every day   cyclobenzaprine 10 MG tablet Commonly known as: FLEXERIL TAKE 1 TABLET BY MOUTH EVERY DAY What changed: when to take this   desvenlafaxine 100 MG 24 hr tablet Commonly known as: PRISTIQ Take 100 mg by mouth daily.   diclofenac sodium 1 % Gel Commonly known as: VOLTAREN Apply 2 g topically 4 (four) times daily.   Iron 28 MG Tabs Take 28 mg by mouth daily with breakfast.   Krill Oil 300 MG Caps Take 300 mg by mouth daily.   metoprolol succinate 50 MG 24 hr tablet Commonly known as: TOPROL-XL TAKE ONE AND 1/2 TABS BY MOUTH IN THE MORNING What changed: See the new instructions.   montelukast 10 MG tablet Commonly known as: SINGULAIR TAKE 1 TABLET BY MOUTH EVERY DAY What changed: when to take this   Ocuvite Lutein 25 25-5 MG Caps Generic drug: Lutein-Zeaxanthin Take 1 capsule by mouth daily.   One-A-Day Womens 50+ Advantage Tabs Take 1 tablet by mouth daily.   Osteo Bi-Flex Triple Strength Tabs Take 1 tablet by mouth in the morning and at bedtime.   pantoprazole 40 MG tablet Commonly known as: Protonix Take 1 tablet (40 mg total) by mouth 2 (two) times daily.   polyethylene glycol powder 17 GM/SCOOP powder Commonly known as: GLYCOLAX/MIRALAX Take 8.5 g by mouth See admin instructions. Mix 8.5 grams into 4-8 ounces of water and drink by mouth once a day   Synthroid 75 MCG tablet Generic drug: levothyroxine TAKE 1 TABLET BY MOUTH EVERY DAY What changed:  how much to take when to take this   vitamin C 500 MG tablet Commonly known as: ASCORBIC ACID Take 500 mg by mouth daily.        Day of Discharge BP (!) 136/59  (BP Location: Left Arm)   Pulse 76   Temp 98 F (36.7 C) (Oral)   Resp 20   SpO2 100%   Physical Exam: General: No acute respiratory distress Lungs: Clear to auscultation bilaterally without wheezes or crackles Cardiovascular: Regular rate and rhythm without murmur gallop or rub normal S1 and S2 Abdomen: Nontender, nondistended, soft, bowel sounds positive, no rebound, no ascites, no appreciable mass Extremities: No significant cyanosis, clubbing, or edema bilateral lower extremities  Basic Metabolic Panel: Recent Labs  Lab 07/20/21 1542 07/22/21 1356 07/23/21 0446  NA 137 134* 130*  K 4.2 3.4* 4.3  CL 99 101 101  CO2 29 26 21*  GLUCOSE 146* 117* 100*  BUN 12 20 14   CREATININE 0.62 0.49 0.52  CALCIUM 9.5 9.1 8.3*  MG  --   --  2.2    Liver Function Tests:  Recent Labs  Lab 07/20/21 1542 07/22/21 1356 07/23/21 0446  AST 15 15 16   ALT 11 12 10   ALKPHOS 78 78 67  BILITOT 0.3 0.4 0.5  PROT 6.3* 6.9 5.9*  ALBUMIN 3.4* 3.6 3.2*   Recent Labs  Lab 07/20/21 1542 07/22/21 1356  LIPASE 29 28     CBC: Recent Labs  Lab 07/22/21 1356 07/22/21 2150 07/23/21 0435 07/23/21 0959 07/25/21 0536  WBC 10.3 10.4 9.9 7.2 7.2  HGB 9.4* 8.9* 8.2* 8.3* 8.1*  HCT 27.8* 26.3* 24.6* 25.1* 23.7*  MCV 99.3 99.6 100.8* 100.0 98.3  PLT 471* 432* 379 402* 439*     Recent Results (from the past 240 hour(s))  Resp Panel by RT-PCR (Flu A&B, Covid) Nasopharyngeal Swab     Status: None   Collection Time: 07/22/21  9:50 PM   Specimen: Nasopharyngeal Swab; Nasopharyngeal(NP) swabs in vial transport medium  Result Value Ref Range Status   SARS Coronavirus 2 by RT PCR NEGATIVE NEGATIVE Final    Comment: (NOTE) SARS-CoV-2 target nucleic acids are NOT DETECTED.  The SARS-CoV-2 RNA is generally detectable in upper respiratory specimens during the acute phase of infection. The lowest concentration of SARS-CoV-2 viral copies this assay can detect is 138 copies/mL. A negative result  does not preclude SARS-Cov-2 infection and should not be used as the sole basis for treatment or other patient management decisions. A negative result may occur with  improper specimen collection/handling, submission of specimen other than nasopharyngeal swab, presence of viral mutation(s) within the areas targeted by this assay, and inadequate number of viral copies(<138 copies/mL). A negative result must be combined with clinical observations, patient history, and epidemiological information. The expected result is Negative.  Fact Sheet for Patients:  EntrepreneurPulse.com.au  Fact Sheet for Healthcare Providers:  IncredibleEmployment.be  This test is no t yet approved or cleared by the [place] FDA and  has been authorized for detection and/or diagnosis of SARS-CoV-2 by FDA under an Emergency Use Authorization (EUA). This EUA will remain  in effect (meaning this test can be used) for the duration of the COVID-19 declaration under Section 564(b)(1) of the Act, 21 U.S.C.section 360bbb-3(b)(1), unless the authorization is terminated  or revoked sooner.       Influenza A by PCR NEGATIVE NEGATIVE Final   Influenza B by PCR NEGATIVE NEGATIVE Final    Comment: (NOTE) The Xpert Xpress SARS-CoV-2/FLU/RSV plus assay is intended as an aid in the diagnosis of influenza from Nasopharyngeal swab specimens and should not be used as a sole basis for treatment. Nasal washings and aspirates are unacceptable for Xpert Xpress SARS-CoV-2/FLU/RSV testing.  Fact Sheet for Patients: EntrepreneurPulse.com.au  Fact Sheet for Healthcare Providers: IncredibleEmployment.be  This test is not yet approved or cleared by the [place] FDA and has been authorized for detection and/or diagnosis of SARS-CoV-2 by FDA under an Emergency Use Authorization (EUA). This EUA will remain in effect (meaning this test can be used) for  the duration of the COVID-19 declaration under Section 564(b)(1) of the Act, 21 U.S.C. section 360bbb-3(b)(1), unless the authorization is terminated or revoked.  Performed at [facility], [place] [address] [place], [place] [address]       Time spent in discharge (includes decision making & examination of pt): 35 minutes  07/25/2021, 8:43 AM   Cherene Altes, MD [facility] Office  [phone]

## 2021-07-25 NOTE — Progress Notes (Signed)
Patient will be discharging home later this day. Belongings were returned. Education on medication was provided.

## 2021-07-25 NOTE — Discharge Instructions (Signed)
DO NOT TAKE ANY NSAID MEDICATIONS (alleve, ibuprofen, motrin, aspirin, etc)

## 2021-07-26 ENCOUNTER — Encounter (HOSPITAL_COMMUNITY): Payer: Self-pay | Admitting: Gastroenterology

## 2021-07-28 ENCOUNTER — Ambulatory Visit: Payer: Medicare Other | Admitting: Physician Assistant

## 2021-07-28 LAB — SURGICAL PATHOLOGY

## 2021-07-29 ENCOUNTER — Encounter (HOSPITAL_COMMUNITY): Payer: Self-pay | Admitting: Gastroenterology

## 2021-07-31 ENCOUNTER — Other Ambulatory Visit: Payer: Self-pay

## 2021-07-31 DIAGNOSIS — D509 Iron deficiency anemia, unspecified: Secondary | ICD-10-CM

## 2021-07-31 MED ORDER — NA SULFATE-K SULFATE-MG SULF 17.5-3.13-1.6 GM/177ML PO SOLN: ORAL | 0 refills | Status: DC

## 2021-08-05 ENCOUNTER — Telehealth: Payer: Self-pay | Admitting: Gastroenterology

## 2021-08-05 NOTE — Telephone Encounter (Signed)
Is having difficulty going to the bathroom.  She usually takes Miralax and Citracel before breakfast.  Can she also take it before her supper as well?  Please call and advise.  Would like to hear from you today if possible.

## 2021-08-06 NOTE — Telephone Encounter (Signed)
Patient advised okay to take Miralax twice a day as needed

## 2021-08-18 ENCOUNTER — Ambulatory Visit: Payer: Medicare Other | Admitting: Physician Assistant

## 2021-09-01 ENCOUNTER — Encounter: Payer: Medicare Other | Admitting: Gastroenterology

## 2021-09-01 DIAGNOSIS — A6000 Herpesviral infection of urogenital system, unspecified: Secondary | ICD-10-CM | POA: Insufficient documentation

## 2021-09-02 ENCOUNTER — Ambulatory Visit: Payer: Medicare Other | Admitting: Gastroenterology

## 2021-09-22 ENCOUNTER — Telehealth: Payer: Self-pay | Admitting: Gastroenterology

## 2021-09-22 MED ORDER — PANTOPRAZOLE SODIUM 40 MG PO TBEC: 40.0000 mg | DELAYED_RELEASE_TABLET | Freq: Two times a day (BID) | ORAL | 5 refills | Status: DC

## 2021-09-22 NOTE — Telephone Encounter (Signed)
Inbound call from patient asking if she needs to continue taking pantoprazole medication.  If so she needs additional refills so it please to be sent to [facility] pharmacy in chart.

## 2021-09-22 NOTE — Telephone Encounter (Signed)
Prescription sent to patient's pharmacy.

## 2021-10-09 ENCOUNTER — Other Ambulatory Visit (INDEPENDENT_AMBULATORY_CARE_PROVIDER_SITE_OTHER): Payer: Medicare Other

## 2021-10-09 ENCOUNTER — Ambulatory Visit (INDEPENDENT_AMBULATORY_CARE_PROVIDER_SITE_OTHER): Payer: Medicare Other | Admitting: Physician Assistant

## 2021-10-09 ENCOUNTER — Encounter: Payer: Self-pay | Admitting: *Deleted

## 2021-10-09 VITALS — BP 128/62 | HR 95 | Ht 59.0 in | Wt 118.4 lb

## 2021-10-09 DIAGNOSIS — K5900 Constipation, unspecified: Secondary | ICD-10-CM

## 2021-10-09 DIAGNOSIS — K269 Duodenal ulcer, unspecified as acute or chronic, without hemorrhage or perforation: Secondary | ICD-10-CM

## 2021-10-09 DIAGNOSIS — D509 Iron deficiency anemia, unspecified: Secondary | ICD-10-CM | POA: Diagnosis not present

## 2021-10-09 DIAGNOSIS — R634 Abnormal weight loss: Secondary | ICD-10-CM | POA: Diagnosis not present

## 2021-10-09 LAB — CBC WITH DIFFERENTIAL/PLATELET
Basophils Absolute: 0 10*3/uL (ref 0.0–0.1)
Basophils Relative: 0.3 % (ref 0.0–3.0)
Eosinophils Absolute: 0.2 10*3/uL (ref 0.0–0.7)
Eosinophils Relative: 2.5 % (ref 0.0–5.0)
HCT: 38.2 % (ref 36.0–46.0)
Hemoglobin: 12.6 g/dL (ref 12.0–15.0)
Lymphocytes Relative: 15.6 % (ref 12.0–46.0)
Lymphs Abs: 1.1 10*3/uL (ref 0.7–4.0)
MCHC: 32.9 g/dL (ref 30.0–36.0)
MCV: 92.6 fl (ref 78.0–100.0)
Monocytes Absolute: 0.9 10*3/uL (ref 0.1–1.0)
Monocytes Relative: 13.4 % — ABNORMAL HIGH (ref 3.0–12.0)
Neutro Abs: 4.8 10*3/uL (ref 1.4–7.7)
Neutrophils Relative %: 68.2 % (ref 43.0–77.0)
Platelets: 333 10*3/uL (ref 150.0–400.0)
RBC: 4.13 Mil/uL (ref 3.87–5.11)
RDW: 15.4 % (ref 11.5–15.5)
WBC: 7 10*3/uL (ref 4.0–10.5)

## 2021-10-09 MED ORDER — PANTOPRAZOLE SODIUM 40 MG PO TBEC: 40.0000 mg | DELAYED_RELEASE_TABLET | Freq: Every day | ORAL | 0 refills | Status: DC

## 2021-10-09 MED ORDER — PLENVU 140 G PO SOLR: 1.0000 | ORAL | 0 refills | Status: DC

## 2021-10-09 NOTE — Patient Instructions (Addendum)
Your provider has requested that you go to the basement level for lab work before leaving today. Press "B" on the elevator. The lab is located at the first door on the left as you exit the elevator.  Decrease pantoprazole to 40 mg daily  Decrease Miralax to 9 grams (1/2 capful) dissolved in water/juice once daily.  You have been scheduled for a colonoscopy. Please follow written instructions given to you at your visit today.  Please pick up your prep supplies at the pharmacy within the next 1-3 days. If you use inhalers (even only as needed), please bring them with you on the day of your procedure.  If you are age 18 or older, your body mass index should be between 23-30. Your Body mass index is 23.91 kg/m. If this is out of the aforementioned range listed, please consider follow up with your Primary Care Provider.  If you are age 7 or younger, your body mass index should be between 19-25. Your Body mass index is 23.91 kg/m. If this is out of the aformentioned range listed, please consider follow up with your Primary Care Provider.   ________________________________________________________  The [facility] providers would like to encourage you to use [facility] to communicate with providers for non-urgent requests or questions.  Due to long hold times on the telephone, sending your provider a message by [facility] may be a faster and more efficient way to get a response.  Please allow 48 business hours for a response.  Please remember that this is for non-urgent requests.  _______________________________________________________  Due to recent changes in healthcare laws, you may see the results of your imaging and laboratory studies on MyChart before your provider has had a chance to review them.  We understand that in some cases there may be results that are confusing or concerning to you. Not all laboratory results come back in the same time frame and the provider may be waiting for multiple results  in order to interpret others.  Please give [place] 48 hours in order for your provider to thoroughly review all the results before contacting the office for clarification of your results.

## 2021-10-09 NOTE — Progress Notes (Signed)
Chief Complaint: "Hard bowel movements"  HPI:    Sue Green is a 76 year old Caucasian female with a past medical history as listed below including fibromyalgia and reflux, known to Dr. Fuller Plan, who presents to clinic today for "hard bowel movements".      08/18/2011 colonoscopy was normal.  Repeat recommended 10 years.    08/12/2020 patient described having some "tears" on her bottom.  She had a dermal tear about 1 inch above her rectum and a posterior fissure.  We treated her with nitroglycerin ointment 3 times daily and also recommended a stool softener.    07/23/2021 patient seen in hospital for anemia and heme positive stool.  That time expressed some epigastric abdominal pain and had an EGD showing duodenal ulcer.  Also discussed anemia and recommended outpatient colonoscopy.  Hemoglobin on 07/25/2021 was 8.1 (13.1 on 01/15/2021).    07/24/2021 EGD within normal esophagus, erythematous mucosa in the greater curvature nonbleeding large duodenal ulcer with a flat pigmented spot and friable margins as well as acquired duodenal stenosis.  Patient told to follow-up in a month for outpatient colonoscopy.    Today, the patient tells me that she is really here because she had some very hard excruciatingly painful to pass stools a few weeks back and she wants to make sure that nothing is wrong with her rectum.  Since that time she has restarted Citrucel and MiraLAX and her stools are soft, in fact sometimes they are too soft and she wipes and has mushy material left over.  Tells me she has no longer having any rectal pain but want someone to look at it to make sure.    We also discussed patient's recent hospitalization.  She is not very happy that no one called her to tell her to decrease her Pantoprazole to once daily after she had been on it 2 months.  Tells me she is still taking Pantoprazole 40 twice daily and read all about the side effects of this medication.    We also discussed that patient needs a  colonoscopy given that she was anemic in the hospital and is overdue from a surveillance standpoint.  She is not happy with this either because she was apparently not told she was anemic in the hospital.  Tells me she was just having pain which was "more in my chest" and everybody told her it was my gallbladder until Dr. Fuller Plan said it was my ulcer.    Also discusses that she is not happy with her PCP and did not appreciate the care received at [facility] urology.  She is having issues with bladder leakage which is why she did not follow through with her colonoscopy which was previously scheduled.  Tells me she would like to trial the medicine they gave her first before her colonoscopy.    Also discusses some weight loss, apparently her close it is falling out for now.  Was running around 130/140 and today weighs 118.    Her cat Lanae Boast is going to the vet for asthma.  She is very worried about him.    Denies fever, chills or weight loss.  Past Medical History:  Diagnosis Date   Allergy    Anal fissure    Anemia, iron deficiency 03/06/2014   Anxiety    Arthritis    Asthma    B12 deficiency    Cholelithiasis    Chronic tension headaches    IN PAST   Depression    Duodenal stenosis  Duodenal ulcer    Fibromyalgia    GERD (gastroesophageal reflux disease)    Glaucoma     Per pt, she does not have glaucoma.   Hepatic cyst    Hypertension    Hypothyroidism    Internal hemorrhoids    Iron deficiency anemia    Osteopenia    Pyloric stenosis    Scoliosis    Thyroid disease    hypothyroidism   Vitamin D deficiency     Past Surgical History:  Procedure Laterality Date   APPENDECTOMY  1975   BIOPSY  07/24/2021   Procedure: BIOPSY;  Surgeon: Ladene Artist, MD;  Location: [facility];  Service: Endoscopy;;   ESOPHAGOGASTRODUODENOSCOPY (EGD) WITH PROPOFOL N/A 07/24/2021   Procedure: ESOPHAGOGASTRODUODENOSCOPY (EGD) WITH PROPOFOL;  Surgeon: Ladene Artist, MD;  Location: [facility];   Service: Endoscopy;  Laterality: N/A;   SHOULDER ARTHROSCOPY  2001   rt shoulder   TONSILLECTOMY AND ADENOIDECTOMY     76 years old    Current Outpatient Medications  Medication Sig Dispense Refill   acetaminophen (TYLENOL) 325 MG tablet Take 2 tablets (650 mg total) by mouth every 6 (six) hours as needed for mild pain (or Fever >/= 101).     AMBULATORY NON FORMULARY MEDICATION Nitroglycerine ointment 0.125 %  Apply a pea sized amount internally and on lesion three times daily for 6 weeks 30 g 0   Artificial Saliva (ACT DRY MOUTH) LOZG Use as directed 1 lozenge in the mouth or throat every 6 (six) hours as needed (for a dry mouth).     Ascorbic Acid (VITAMIN C) 500 MG tablet Take 500 mg by mouth daily.     busPIRone (BUSPAR) 10 MG tablet Take 10 mg by mouth 2 (two) times daily with a meal.     cyclobenzaprine (FLEXERIL) 10 MG tablet TAKE 1 TABLET BY MOUTH EVERY DAY (Patient taking differently: Take 10 mg by mouth in the morning.) 30 tablet 5   desvenlafaxine (PRISTIQ) 100 MG 24 hr tablet Take 100 mg by mouth daily.     diclofenac sodium (VOLTAREN) 1 % GEL Apply 2 g topically 4 (four) times daily. 100 g 5   Ferrous Sulfate (IRON) 28 MG TABS Take 28 mg by mouth daily with breakfast.     Krill Oil 300 MG CAPS Take 300 mg by mouth daily.     Methylcellulose, Laxative, (CITRUCEL PO) See admin instructions. Mix 1 tablespoonful of powder into water and drink before breakfast every day     metoprolol succinate (TOPROL-XL) 50 MG 24 hr tablet TAKE ONE AND 1/2 TABS BY MOUTH IN THE MORNING (Patient taking differently: Take 75 mg by mouth in the morning.) 135 tablet 3   Misc Natural Products (OSTEO BI-FLEX TRIPLE STRENGTH) TABS Take 1 tablet by mouth in the morning and at bedtime.     montelukast (SINGULAIR) 10 MG tablet TAKE 1 TABLET BY MOUTH EVERY DAY (Patient taking differently: Take 10 mg by mouth at bedtime.) 90 tablet 3   Multiple Vitamins-Minerals (ONE-A-DAY WOMENS 50+ ADVANTAGE) TABS Take 1 tablet  by mouth daily.     Na Sulfate-K Sulfate-Mg Sulf 17.5-3.13-1.6 GM/177ML SOLN Take per procedure instructions 354 mL 0   OCUVITE LUTEIN 25 25-5 MG CAPS Take 1 capsule by mouth daily.     pantoprazole (PROTONIX) 40 MG tablet Take 1 tablet (40 mg total) by mouth 2 (two) times daily. 90 tablet 5   polyethylene glycol powder (GLYCOLAX/MIRALAX) 17 GM/SCOOP powder Take 8.5 g by mouth See  admin instructions. Mix 8.5 grams into 4-8 ounces of water and drink by mouth once a day     SYNTHROID 75 MCG tablet TAKE 1 TABLET BY MOUTH EVERY DAY (Patient taking differently: Take 75 mcg by mouth at bedtime.) 90 tablet 1   No current facility-administered medications for this visit.    Allergies as of 10/09/2021 - Review Complete 07/24/2021  Allergen Reaction Noted   Penicillins Hives 12/29/2010   Doxycycline Nausea Only 12/29/2010   Antihistamines, diphenhydramine-type Other (See Comments) 02/10/2016   Diphenhydramine Other (See Comments) 08/18/2011   Lorazepam Other (See Comments) 07/22/2021   Latex Rash and Other (See Comments) 09/04/2013    Family History  Problem Relation Age of Onset   Diabetes Mother    Arthritis Mother    Cancer Father    Diabetes Father    Colon cancer Neg Hx     Social History   Socioeconomic History   Marital status: Divorced    Spouse name: Not on file   Number of children: Not on file   Years of education: 16   Highest education level: Not on file  Occupational History   Occupation: Retired  Tobacco Use   Smoking status: Never   Smokeless tobacco: Never  Substance and Sexual Activity   Alcohol use: No    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   Drug use: No   Sexual activity: Not on file  Other Topics Concern   Not on file  Social History Narrative   Regular exercise-no   Caffeine Use-unsure   Social Determinants of Health   Financial Resource Strain: Not on file  Food Insecurity: Not on file  Transportation Needs: Not on  file  Physical Activity: Not on file  Stress: Not on file  Social Connections: Not on file  Intimate Partner Violence: Not on file    Review of Systems:    Constitutional: No weight loss, fever or chills Cardiovascular: No chest pain  Respiratory: No SOB Gastrointestinal: See HPI and otherwise negative   Physical Exam:  Vital signs: BP 128/62    Pulse 95    Ht 4\' 11"  (1.499 m)    Wt 118 lb 6 oz (53.7 kg)    BMI 23.91 kg/m    Constitutional: Caucasian female appears to be in NAD, Well developed, Well nourished, alert and cooperative Respiratory: Respirations even and unlabored. Lungs clear to auscultation bilaterally.   No wheezes, crackles, or rhonchi.  Cardiovascular: Normal S1, S2. No MRG. Regular rate and rhythm. No peripheral edema, cyanosis or pallor.  Gastrointestinal:  Soft, nondistended, nontender. No rebound or guarding. Normal bowel sounds. No appreciable masses or hepatomegaly. Rectal: External: Normal, healed previous fissure; internal: No TTP, no mass; anoscopy: No abnormality Psychiatric: Oriented to person, place and time. Demonstrates good judgement and reason without abnormal affect or behaviors.  RELEVANT LABS AND IMAGING: CBC    Component Value Date/Time   WBC 7.2 07/25/2021 0536   RBC 2.41 (L) 07/25/2021 0536   HGB 8.1 (L) 07/25/2021 0536   HCT 23.7 (L) 07/25/2021 0536   PLT 439 (H) 07/25/2021 0536   MCV 98.3 07/25/2021 0536   MCH 33.6 07/25/2021 0536   MCHC 34.2 07/25/2021 0536   RDW 12.7 07/25/2021 0536   LYMPHSABS 1.2 01/15/2021 1544   MONOABS 1.0 01/15/2021 1544   EOSABS 0.1 01/15/2021 1544   BASOSABS 0.0 01/15/2021 1544    CMP     Component Value Date/Time   NA 130 (  L) 07/23/2021 0446   K 4.3 07/23/2021 0446   CL 101 07/23/2021 0446   CO2 21 (L) 07/23/2021 0446   GLUCOSE 100 (H) 07/23/2021 0446   BUN 14 07/23/2021 0446   CREATININE 0.52 07/23/2021 0446   CREATININE 0.70 07/17/2012 0950   CALCIUM 8.3 (L) 07/23/2021 0446   PROT 5.9 (L)  07/23/2021 0446   ALBUMIN 3.2 (L) 07/23/2021 0446   AST 16 07/23/2021 0446   ALT 10 07/23/2021 0446   ALKPHOS 67 07/23/2021 0446   BILITOT 0.5 07/23/2021 0446   GFRNONAA >60 07/23/2021 0446    Assessment: 1.  Iron deficiency anemia: Identified during recent hospitalization with Hemoccult positive stools, EGD done with duodenal ulcer thought to be the etiology, but recommended outpatient colonoscopy 2.  Weight loss: 20+ pounds over the past few months per patient 3.  Constipation: Hard to pass stools better with Citrucel and MiraLAX 4.  Duodenal ulcer: Found during recent hospitalization in November, no further epigastric pain 5.  Screening for colorectal cancer: Patient overdue for colonoscopy  Plan: 1.  Ordered a CBC to recheck anemia today. 2.  Scheduled the patient for a diagnostic colonoscopy in the [facility] with Dr. Fuller Plan in March.  Patient agreed this would give her time to work on her bladder leakage.  Also discussed a pad with a Depends if she would like to try that. 3.  Continue on Pantoprazole but can decrease to 40 mg once daily. 4.  Continue Citrucel and MiraLAX 5.  Discussed that patient's recent rectal pain was likely from a fissure which has since healed.  Especially since she has a history of this. 6.  Recommend the patient follow-up with her urologist. 7.  Patient to follow in clinic per recommendations of Dr. Fuller Plan after time of procedure.  Ellouise Newer, PA-C [facility] 10/09/2021, 3:16 PM  Cc: Biagio Borg, MD

## 2021-10-12 ENCOUNTER — Telehealth: Payer: Self-pay

## 2021-10-12 NOTE — Telephone Encounter (Signed)
-----   Message from [place], [place] sent at 10/12/2021  8:30 AM EST ----- Please let patient know that her hemoglobin is now normal.  This is a great sign.  We do not need to monitor this any further.  She should proceed with her colonoscopy as scheduled in March.  Thanks, JL L

## 2021-10-12 NOTE — Telephone Encounter (Signed)
Left message for patient to please call back. 

## 2021-10-13 ENCOUNTER — Telehealth: Payer: Self-pay

## 2021-10-13 NOTE — Telephone Encounter (Signed)
Called again attempting to give patient lab results, left voice message to please call back

## 2021-10-13 NOTE — Telephone Encounter (Signed)
-----   Message from [place], [place] sent at 10/12/2021  8:30 AM EST ----- Please let patient know that her hemoglobin is now normal.  This is a great sign.  We do not need to monitor this any further.  She should proceed with her colonoscopy as scheduled in March.  Thanks, JL L

## 2021-10-21 ENCOUNTER — Telehealth: Payer: Self-pay

## 2021-10-21 DIAGNOSIS — R040 Epistaxis: Secondary | ICD-10-CM

## 2021-10-21 NOTE — Telephone Encounter (Signed)
Ok this is done 

## 2021-10-21 NOTE — Telephone Encounter (Signed)
Pt is calling requesting another ENT referral. Pt was referred in 2012 to Dr. Lucia Gaskins for frequent nose bleeds.   Please advise pt [phone]

## 2021-10-28 ENCOUNTER — Encounter: Payer: Self-pay | Admitting: Internal Medicine

## 2021-10-28 ENCOUNTER — Other Ambulatory Visit: Payer: Self-pay

## 2021-10-28 ENCOUNTER — Ambulatory Visit (INDEPENDENT_AMBULATORY_CARE_PROVIDER_SITE_OTHER): Payer: Medicare Other | Admitting: Internal Medicine

## 2021-10-28 VITALS — BP 124/84 | HR 85 | Resp 18 | Ht 59.0 in | Wt 119.0 lb

## 2021-10-28 DIAGNOSIS — E538 Deficiency of other specified B group vitamins: Secondary | ICD-10-CM

## 2021-10-28 DIAGNOSIS — E039 Hypothyroidism, unspecified: Secondary | ICD-10-CM

## 2021-10-28 DIAGNOSIS — R634 Abnormal weight loss: Secondary | ICD-10-CM

## 2021-10-28 DIAGNOSIS — I10 Essential (primary) hypertension: Secondary | ICD-10-CM | POA: Diagnosis not present

## 2021-10-28 DIAGNOSIS — D509 Iron deficiency anemia, unspecified: Secondary | ICD-10-CM | POA: Diagnosis not present

## 2021-10-28 DIAGNOSIS — F419 Anxiety disorder, unspecified: Secondary | ICD-10-CM

## 2021-10-28 DIAGNOSIS — Z0001 Encounter for general adult medical examination with abnormal findings: Secondary | ICD-10-CM | POA: Diagnosis not present

## 2021-10-28 DIAGNOSIS — E559 Vitamin D deficiency, unspecified: Secondary | ICD-10-CM | POA: Diagnosis not present

## 2021-10-28 DIAGNOSIS — R739 Hyperglycemia, unspecified: Secondary | ICD-10-CM

## 2021-10-28 DIAGNOSIS — F32A Depression, unspecified: Secondary | ICD-10-CM

## 2021-10-28 LAB — CBC WITH DIFFERENTIAL/PLATELET
Basophils Absolute: 0 10*3/uL (ref 0.0–0.1)
Basophils Relative: 0.5 % (ref 0.0–3.0)
Eosinophils Absolute: 0.1 10*3/uL (ref 0.0–0.7)
Eosinophils Relative: 1.7 % (ref 0.0–5.0)
HCT: 37.6 % (ref 36.0–46.0)
Hemoglobin: 12.5 g/dL (ref 12.0–15.0)
Lymphocytes Relative: 13.9 % (ref 12.0–46.0)
Lymphs Abs: 0.9 10*3/uL (ref 0.7–4.0)
MCHC: 33.4 g/dL (ref 30.0–36.0)
MCV: 93.1 fl (ref 78.0–100.0)
Monocytes Absolute: 0.7 10*3/uL (ref 0.1–1.0)
Monocytes Relative: 10.6 % (ref 3.0–12.0)
Neutro Abs: 4.8 10*3/uL (ref 1.4–7.7)
Neutrophils Relative %: 73.3 % (ref 43.0–77.0)
Platelets: 326 10*3/uL (ref 150.0–400.0)
RBC: 4.04 Mil/uL (ref 3.87–5.11)
RDW: 15.6 % — ABNORMAL HIGH (ref 11.5–15.5)
WBC: 6.6 10*3/uL (ref 4.0–10.5)

## 2021-10-28 LAB — LIPID PANEL
Cholesterol: 166 mg/dL (ref 0–200)
HDL: 38.2 mg/dL — ABNORMAL LOW (ref >39.00)
NonHDL: 128.22
Total CHOL/HDL Ratio: 4
Triglycerides: 288 mg/dL — ABNORMAL HIGH (ref 0.0–149.0)
VLDL: 57.6 mg/dL — ABNORMAL HIGH (ref 0.0–40.0)

## 2021-10-28 LAB — VITAMIN B12: Vitamin B-12: >1504 pg/mL — ABNORMAL HIGH (ref 211–911)

## 2021-10-28 LAB — FERRITIN: Ferritin: 40.9 ng/mL (ref 10.0–291.0)

## 2021-10-28 LAB — LDL CHOLESTEROL, DIRECT: Direct LDL: 103 mg/dL

## 2021-10-28 LAB — HEPATIC FUNCTION PANEL
ALT: 9 U/L (ref 0–35)
AST: 14 U/L (ref 0–37)
Albumin: 4 g/dL (ref 3.5–5.2)
Alkaline Phosphatase: 76 U/L (ref 39–117)
Bilirubin, Direct: 0 mg/dL (ref 0.0–0.3)
Total Bilirubin: 0.3 mg/dL (ref 0.2–1.2)
Total Protein: 6.9 g/dL (ref 6.0–8.3)

## 2021-10-28 LAB — IBC PANEL
Iron: 114 ug/dL (ref 42–145)
Saturation Ratios: 40.9 % (ref 20.0–50.0)
TIBC: 278.6 ug/dL (ref 250.0–450.0)
Transferrin: 199 mg/dL — ABNORMAL LOW (ref 212.0–360.0)

## 2021-10-28 LAB — BASIC METABOLIC PANEL
BUN: 9 mg/dL (ref 6–23)
CO2: 30 mEq/L (ref 19–32)
Calcium: 9.5 mg/dL (ref 8.4–10.5)
Chloride: 100 mEq/L (ref 96–112)
Creatinine, Ser: 0.74 mg/dL (ref 0.40–1.20)
GFR: 78.84 mL/min (ref >60.00)
Glucose, Bld: 108 mg/dL — ABNORMAL HIGH (ref 70–99)
Potassium: 4.2 mEq/L (ref 3.5–5.1)
Sodium: 137 mEq/L (ref 135–145)

## 2021-10-28 LAB — HEMOGLOBIN A1C: Hgb A1c MFr Bld: 6.1 % (ref 4.6–6.5)

## 2021-10-28 LAB — VITAMIN D 25 HYDROXY (VIT D DEFICIENCY, FRACTURES): VITD: 79.6 ng/mL (ref 30.00–100.00)

## 2021-10-28 LAB — TSH: TSH: 1.62 u[IU]/mL (ref 0.35–5.50)

## 2021-10-28 NOTE — Assessment & Plan Note (Signed)
Age and sex appropriate education and counseling updated with regular exercise and diet Referrals for preventative services - none needed Immunizations addressed - declines flu shot, shingrix Smoking counseling  - none needed Evidence for depression or other mood disorder - chronic anxiety/depression Most recent labs reviewed. I have personally reviewed and have noted: 1) the patient's medical and social history 2) The patient's current medications and supplements 3) The patient's height, weight, and BMI have been recorded in the chart

## 2021-10-28 NOTE — Assessment & Plan Note (Signed)
For f/u iron labs

## 2021-10-28 NOTE — Patient Instructions (Addendum)
Please continue all other medications as before, including ok to take the thyroid medication  Please have the pharmacy call with any other refills you may need.  Please continue your efforts at being more active, low cholesterol diet, and weight control.  You are otherwise up to date with prevention measures today.  Please keep your appointments with your specialists as you may have planned - colonoscopy soon  Please go to the LAB at the blood drawing area for the tests to be done  You will be contacted by phone if any changes need to be made immediately.  Otherwise, you will receive a letter about your results with an explanation, but please check with MyChart first.  Please remember to sign up for MyChart if you have not done so, as this will be important to you in the future with finding out test results, communicating by private email, and scheduling acute appointments online when needed.  Please make an Appointment to return in 6 months, or sooner if needed

## 2021-10-28 NOTE — Assessment & Plan Note (Signed)
Recent onset mostly with last hospn with GI bleeding, duodenal ulcer and gastritis now on daily PPI, ? Geriatric decline vs other; advised pt for more liberalized diet than frozen food diet, MVI qd, and ensure twice per day

## 2021-10-28 NOTE — Assessment & Plan Note (Signed)
Uncontrolled, Not taking thyroid replacment recently as feared taking it with the bid PPI, but will now restart,

## 2021-10-28 NOTE — Progress Notes (Signed)
Patient ID: Sue Green, female   DOB: 1946/08/17, 76 y.o.   MRN: [id number]         Chief Complaint:: wellness exam and Follow-up (Concerned about her recent weight loss and she gets very fatigued easily now. She also isn't eating that well now either. )  , low thyroid, iron deficiency, anxiety/depression, htn       HPI:  Sue Green is a 76 y.o. female here for wellness exam; for colonoscopy mar 3, declines flu shot and shingrix, o/w up to date               Also has experienced 17 lb wt loss on persistent one yr frozen foods diet, but plans to work on more liberal diet.  Denies hyper or hypo thyroid symptoms such as voice, skin or hair change.  Pt denies chest pain, increased sob or doe, wheezing, orthopnea, PND, increased LE swelling, palpitations, dizziness or syncope.   Pt denies polydipsia, polyuria, or new focal neuro s/s.   Pt denies fever,  night sweats, loss of appetite, or other constitutional symptoms  She did read that taking her thyroid med along with the PPI twice per day was dangerous sounding so on her own she has not been taking the thyroid med for about 2 mo.  No overt bleeding or bruising   Wt Readings from Last 3 Encounters:  10/28/21 119 lb (54 kg)  10/09/21 118 lb 6 oz (53.7 kg)  01/13/21 136 lb (61.7 kg)   BP Readings from Last 3 Encounters:  10/28/21 124/84  10/09/21 128/62  07/25/21 (!) 136/59   Immunization History  Administered Date(s) Administered   Fluad Quad(high Dose 65+) 06/05/2020   Influenza Split 07/05/2011   Influenza, High Dose Seasonal PF 05/28/2018, 05/17/2019   Influenza, Seasonal, Injecte, Preservative Fre 06/03/2013   Influenza,inj,Quad PF,6+ Mos 07/03/2014, 05/30/2015   Influenza-Unspecified 07/17/2012, 04/30/2016   PFIZER(Purple Top)SARS-COV-2 Vaccination 11/05/2019, 11/26/2019   Pneumococcal Conjugate-13 07/31/2013   Pneumococcal Polysaccharide-23 07/17/2012   Td 09/20/2000   Tdap 07/17/2012, 03/25/2017   There are no  preventive care reminders to display for this patient.     Past Medical History:  Diagnosis Date   Allergy    Anal fissure    Anemia, iron deficiency 03/06/2014   Anxiety    Arthritis    Asthma    B12 deficiency    Cholelithiasis    Chronic tension headaches    IN PAST   Depression    Duodenal stenosis    Duodenal ulcer    Fibromyalgia    GERD (gastroesophageal reflux disease)    Glaucoma     Per pt, she does not have glaucoma.   Hepatic cyst    Hypertension    Hypothyroidism    Internal hemorrhoids    Iron deficiency anemia    Osteopenia    Pyloric stenosis    Scoliosis    Thyroid disease    hypothyroidism   Vitamin D deficiency    Past Surgical History:  Procedure Laterality Date   APPENDECTOMY  1975   BIOPSY  07/24/2021   Procedure: BIOPSY;  Surgeon: Meryl Dare, MD;  Location: [facility];  Service: Endoscopy;;   ESOPHAGOGASTRODUODENOSCOPY (EGD) WITH PROPOFOL N/A 07/24/2021   Procedure: ESOPHAGOGASTRODUODENOSCOPY (EGD) WITH PROPOFOL;  Surgeon: Meryl Dare, MD;  Location: [facility];  Service: Endoscopy;  Laterality: N/A;   SHOULDER ARTHROSCOPY  2001   rt shoulder   TONSILLECTOMY AND ADENOIDECTOMY     76 years old  reports that she has never smoked. She has never used smokeless tobacco. She reports that she does not drink alcohol and does not use drugs. family history includes Arthritis in her mother; Cancer in her father; Diabetes in her father and mother. Allergies  Allergen Reactions   Penicillins Hives   Doxycycline Nausea Only   Antihistamines, Diphenhydramine-Type Other (See Comments)    Reaction not recalled   Diphenhydramine Other (See Comments)    Reaction not recalled   Lorazepam Other (See Comments)    Caused trembling in the arms, per the patient   Latex Rash and Other (See Comments)    Patient disputes this in 2022   Current Outpatient Medications on File Prior to Visit  Medication Sig Dispense Refill   acetaminophen  (TYLENOL) 325 MG tablet Take 2 tablets (650 mg total) by mouth every 6 (six) hours as needed for mild pain (or Fever >/= 101).     AMBULATORY NON FORMULARY MEDICATION Nitroglycerine ointment 0.125 %  Apply a pea sized amount internally and on lesion three times daily for 6 weeks 30 g 0   Artificial Saliva (ACT DRY MOUTH) LOZG Use as directed 1 lozenge in the mouth or throat every 6 (six) hours as needed (for a dry mouth).     Ascorbic Acid (VITAMIN C) 500 MG tablet Take 500 mg by mouth daily.     busPIRone (BUSPAR) 10 MG tablet Take 10 mg by mouth 2 (two) times daily with a meal.     cyclobenzaprine (FLEXERIL) 10 MG tablet TAKE 1 TABLET BY MOUTH EVERY DAY (Patient taking differently: Take 10 mg by mouth in the morning.) 30 tablet 5   desvenlafaxine (PRISTIQ) 100 MG 24 hr tablet Take 100 mg by mouth daily.     diclofenac sodium (VOLTAREN) 1 % GEL Apply 2 g topically 4 (four) times daily. 100 g 5   Ferrous Sulfate (IRON) 28 MG TABS Take 28 mg by mouth daily with breakfast.     Krill Oil 300 MG CAPS Take 300 mg by mouth daily.     Methylcellulose, Laxative, (CITRUCEL PO) See admin instructions. Mix 1 tablespoonful of powder into water and drink before breakfast every day     metoprolol succinate (TOPROL-XL) 50 MG 24 hr tablet TAKE ONE AND 1/2 TABS BY MOUTH IN THE MORNING (Patient taking differently: Take 75 mg by mouth in the morning.) 135 tablet 3   Misc Natural Products (OSTEO BI-FLEX TRIPLE STRENGTH) TABS Take 1 tablet by mouth in the morning and at bedtime.     montelukast (SINGULAIR) 10 MG tablet TAKE 1 TABLET BY MOUTH EVERY DAY (Patient taking differently: Take 10 mg by mouth at bedtime.) 90 tablet 3   Multiple Vitamins-Minerals (ONE-A-DAY WOMENS 50+ ADVANTAGE) TABS Take 1 tablet by mouth daily.     Na Sulfate-K Sulfate-Mg Sulf 17.5-3.13-1.6 GM/177ML SOLN Take per procedure instructions 354 mL 0   OCUVITE LUTEIN 25 25-5 MG CAPS Take 1 capsule by mouth daily.     pantoprazole (PROTONIX) 40 MG  tablet Take 1 tablet (40 mg total) by mouth daily. 90 tablet 0   PEG-KCl-NaCl-NaSulf-Na Asc-C (PLENVU) 140 g SOLR Take 1 kit by mouth as directed. Use coupon: BIN: [id number] PNC: CNRX Group: [id number] ID: [id number] 1 each 0   polyethylene glycol powder (GLYCOLAX/MIRALAX) 17 GM/SCOOP powder Take 8.5 g by mouth See admin instructions. Mix 8.5 grams into 4-8 ounces of water and drink by mouth once a day     SYNTHROID 75 MCG tablet TAKE 1  TABLET BY MOUTH EVERY DAY (Patient taking differently: Take 75 mcg by mouth at bedtime.) 90 tablet 1   No current facility-administered medications on file prior to visit.        ROS:  All others reviewed and negative.  Objective        PE:  BP 124/84    Pulse 85    Resp 18    Ht $R'4\' 11"'xA$  (1.499 m)    Wt 119 lb (54 kg)    SpO2 96%    BMI 24.04 kg/m                 Constitutional: Pt appears in NAD               HENT: Head: NCAT.                Right Ear: External ear normal.                 Left Ear: External ear normal.                Eyes: . Pupils are equal, round, and reactive to light. Conjunctivae and EOM are normal               Nose: without d/c or deformity               Neck: Neck supple. Gross normal ROM               Cardiovascular: Normal rate and regular rhythm.                 Pulmonary/Chest: Effort normal and breath sounds without rales or wheezing.                Abd:  Soft, NT, ND, + BS, no organomegaly               Neurological: Pt is alert. At baseline orientation, motor grossly intact               Skin: Skin is warm. No rashes, no other new lesions, LE edema - none               Psychiatric: Pt behavior is normal without agitation , 1-2+ nervous  Micro: none  Cardiac tracings I have personally interpreted today:  none  Pertinent Radiological findings (summarize): none   Lab Results  Component Value Date   WBC 7.0 10/09/2021   HGB 12.6 10/09/2021   HCT 38.2 10/09/2021   PLT 333.0 10/09/2021   GLUCOSE 100 (H) 07/23/2021    CHOL 150 01/15/2021   TRIG 146.0 01/15/2021   HDL 42.40 01/15/2021   LDLDIRECT 98.0 12/13/2018   LDLCALC 78 01/15/2021   ALT 10 07/23/2021   AST 16 07/23/2021   NA 130 (L) 07/23/2021   K 4.3 07/23/2021   CL 101 07/23/2021   CREATININE 0.52 07/23/2021   BUN 14 07/23/2021   CO2 21 (L) 07/23/2021   TSH 0.81 01/15/2021   Assessment/Plan:  Sue Green is a 76 y.o. White or Caucasian [1] female with  has a past medical history of Allergy, Anal fissure, Anemia, iron deficiency (03/06/2014), Anxiety, Arthritis, Asthma, B12 deficiency, Cholelithiasis, Chronic tension headaches, Depression, Duodenal stenosis, Duodenal ulcer, Fibromyalgia, GERD (gastroesophageal reflux disease), Glaucoma, Hepatic cyst, Hypertension, Hypothyroidism, Internal hemorrhoids, Iron deficiency anemia, Osteopenia, Pyloric stenosis, Scoliosis, Thyroid disease, and Vitamin D deficiency.  Hypothyroidism Uncontrolled, Not taking thyroid replacment recently as feared taking it with the bid PPI, but  will now restart,   Encounter for well adult exam with abnormal findings Age and sex appropriate education and counseling updated with regular exercise and diet Referrals for preventative services - none needed Immunizations addressed - declines flu shot, shingrix Smoking counseling  - none needed Evidence for depression or other mood disorder - chronic anxiety/depression Most recent labs reviewed. I have personally reviewed and have noted: 1) the patient's medical and social history 2) The patient's current medications and supplements 3) The patient's height, weight, and BMI have been recorded in the chart   Anemia, iron deficiency For f/u iron labs  Anxiety and depression Chronic stable, cont buspar, pristiq and f/u psychiatry  Essential hypertension BP Readings from Last 3 Encounters:  10/28/21 124/84  10/09/21 128/62  07/25/21 (!) 136/59   Stable, pt to continue medical treatment toprol   Weight  loss Recent onset mostly with last hospn with GI bleeding, duodenal ulcer and gastritis now on daily PPI, ? Geriatric decline vs other; advised pt for more liberalized diet than frozen food diet, MVI qd, and ensure twice per day  Followup: Return in about 6 months (around 04/27/2022).  Cathlean Cower, MD 10/28/2021 6:46 PM [facility]

## 2021-10-28 NOTE — Assessment & Plan Note (Signed)
BP Readings from Last 3 Encounters:  10/28/21 124/84  10/09/21 128/62  07/25/21 (!) 136/59   Stable, pt to continue medical treatment toprol

## 2021-10-28 NOTE — Assessment & Plan Note (Signed)
Chronic stable, cont buspar, pristiq and f/u psychiatry

## 2021-10-29 ENCOUNTER — Telehealth: Payer: Self-pay | Admitting: Internal Medicine

## 2021-10-29 ENCOUNTER — Encounter: Payer: Self-pay | Admitting: Internal Medicine

## 2021-10-29 LAB — URINALYSIS, ROUTINE W REFLEX MICROSCOPIC
Bilirubin Urine: NEGATIVE
Hgb urine dipstick: NEGATIVE
Ketones, ur: NEGATIVE
Leukocytes,Ua: NEGATIVE
Nitrite: NEGATIVE
Specific Gravity, Urine: <=1.005 — AB (ref 1.000–1.030)
Total Protein, Urine: NEGATIVE
Urine Glucose: NEGATIVE
Urobilinogen, UA: 0.2 (ref 0.0–1.0)
pH: 6.5 (ref 5.0–8.0)

## 2021-10-29 NOTE — Telephone Encounter (Signed)
Called patient and informed her of her lab results and that her lab results was mailed to her. Informed patient on Dr. Jenny Reichmann recommendation regarding her nose bleed as well as the possible drug interaction between the protonix and synthroid. Patient verbalize understanding . No further questions.

## 2021-10-29 NOTE — Telephone Encounter (Signed)
No this was discussed at her office visit several times  There is no significant drug interaction to cause [place] to stop a medication or change the dose  OK to take all as prescribed

## 2021-10-29 NOTE — Telephone Encounter (Signed)
Pt requesting a call back for 10-29-2021 lab results, provider has not resulted labs as of yet  Pt states she was informed of a drug interaction between pantoprazole (PROTONIX) 40 MG tablet  and   SYNTHROID 75 MCG tablet  Pt inquiring if she can take pantoprazole in the morning and synthroid in the evening  Pt also states she has a nose bleed and requesting providers recommendations if she should schedule another ov

## 2021-11-05 ENCOUNTER — Telehealth: Payer: Self-pay | Admitting: Internal Medicine

## 2021-11-05 NOTE — Telephone Encounter (Signed)
Pt states ent office she was referred to  does not have openings until March  Patient requesting another referral to: [facility] [address] [place], [place], [place] [address]

## 2021-11-05 NOTE — Telephone Encounter (Signed)
Ok to pass forward to [employer]

## 2021-11-09 ENCOUNTER — Ambulatory Visit: Payer: Medicare Other | Admitting: Internal Medicine

## 2021-11-10 ENCOUNTER — Encounter: Payer: Self-pay | Admitting: Physician Assistant

## 2021-11-10 ENCOUNTER — Ambulatory Visit (INDEPENDENT_AMBULATORY_CARE_PROVIDER_SITE_OTHER): Payer: Medicare Other | Admitting: Physician Assistant

## 2021-11-10 VITALS — BP 132/78 | HR 90 | Ht 59.0 in | Wt 122.2 lb

## 2021-11-10 DIAGNOSIS — K644 Residual hemorrhoidal skin tags: Secondary | ICD-10-CM | POA: Diagnosis not present

## 2021-11-10 DIAGNOSIS — D509 Iron deficiency anemia, unspecified: Secondary | ICD-10-CM

## 2021-11-10 MED ORDER — HYDROCORTISONE (PERIANAL) 2.5 % EX CREA: 1.0000 "application " | TOPICAL_CREAM | Freq: Two times a day (BID) | CUTANEOUS | 0 refills | Status: DC

## 2021-11-10 NOTE — Progress Notes (Signed)
Chief Complaint: Feels like rectum hangs out  HPI:    Sue Green is a 76 year old female with a past medical history as listed below, known to Dr. Fuller Plan, who presents to clinic today because she feels "like my rectum hangs out".    11/25/2021 colonoscopy scheduled Dr. Fuller Plan.    10/09/2021 patient seen in clinic because she had excruciatingly painful and hard to pass stools a few weeks back and then it started Citrucel and MiraLAX and stools were soft.  At that time recheck CBC.  Also schedule patient for diagnostic colonoscopy in the [place].  This was delayed to give patient time to work on her bladder leakage.  Discussed wearing a pad with her depends.  Also continued on Pantoprazole but told her to decrease to 40 mg once daily.  Told to continue Citrucel and MiraLAX.  Discussed rectal pain which was thought likely from a fissure since that is since healed.   Recommend she follow-up with her urologist.    Today, the patient presents to clinic and tells me she "feels like my rectum hangs out".  Tells me she has felt this really even since she was seen last and prior to being seen last, but continually feels something is back there.  Tells me her stools have remained mushy or soft with the use of Citrucel and MiraLAX sometimes twice a day.  Denies straining but does tell me she sits a lot and reads.    Also tells me she cannot remember how to bowel prep for her upcoming colonoscopy.    Denies fever, chills, weight loss, blood in her stool, rectal pain, abdominal pain or other new symptoms.  Past Medical History:  Diagnosis Date   Allergy    Anal fissure    Anemia, iron deficiency 03/06/2014   Anxiety    Arthritis    Asthma    B12 deficiency    Cholelithiasis    Chronic tension headaches    IN PAST   Depression    Duodenal stenosis    Duodenal ulcer    Fibromyalgia    GERD (gastroesophageal reflux disease)    Glaucoma     Per pt, she does not have glaucoma.   Hepatic cyst    Hypertension     Hypothyroidism    Internal hemorrhoids    Iron deficiency anemia    Osteopenia    Pyloric stenosis    Scoliosis    Thyroid disease    hypothyroidism   Vitamin D deficiency     Past Surgical History:  Procedure Laterality Date   APPENDECTOMY  1975   BIOPSY  07/24/2021   Procedure: BIOPSY;  Surgeon: Ladene Artist, MD;  Location: [facility];  Service: Endoscopy;;   ESOPHAGOGASTRODUODENOSCOPY (EGD) WITH PROPOFOL N/A 07/24/2021   Procedure: ESOPHAGOGASTRODUODENOSCOPY (EGD) WITH PROPOFOL;  Surgeon: Ladene Artist, MD;  Location: [facility];  Service: Endoscopy;  Laterality: N/A;   SHOULDER ARTHROSCOPY  2001   rt shoulder   TONSILLECTOMY AND ADENOIDECTOMY     76 years old    Current Outpatient Medications  Medication Sig Dispense Refill   acetaminophen (TYLENOL) 325 MG tablet Take 2 tablets (650 mg total) by mouth every 6 (six) hours as needed for mild pain (or Fever >/= 101).     AMBULATORY NON FORMULARY MEDICATION Nitroglycerine ointment 0.125 %  Apply a pea sized amount internally and on lesion three times daily for 6 weeks 30 g 0   Artificial Saliva (ACT DRY MOUTH) LOZG Use as  directed 1 lozenge in the mouth or throat every 6 (six) hours as needed (for a dry mouth).     Ascorbic Acid (VITAMIN C) 500 MG tablet Take 500 mg by mouth daily.     busPIRone (BUSPAR) 10 MG tablet Take 10 mg by mouth 2 (two) times daily with a meal.     cyclobenzaprine (FLEXERIL) 10 MG tablet TAKE 1 TABLET BY MOUTH EVERY DAY (Patient taking differently: Take 10 mg by mouth in the morning.) 30 tablet 5   desvenlafaxine (PRISTIQ) 100 MG 24 hr tablet Take 100 mg by mouth daily.     diclofenac sodium (VOLTAREN) 1 % GEL Apply 2 g topically 4 (four) times daily. 100 g 5   Ferrous Sulfate (IRON) 28 MG TABS Take 28 mg by mouth daily with breakfast.     Krill Oil 300 MG CAPS Take 300 mg by mouth daily.     Methylcellulose, Laxative, (CITRUCEL PO) See admin instructions. Mix 1 tablespoonful of powder into  water and drink before breakfast every day     metoprolol succinate (TOPROL-XL) 50 MG 24 hr tablet TAKE ONE AND 1/2 TABS BY MOUTH IN THE MORNING (Patient taking differently: Take 75 mg by mouth in the morning.) 135 tablet 3   Misc Natural Products (OSTEO BI-FLEX TRIPLE STRENGTH) TABS Take 1 tablet by mouth in the morning and at bedtime.     montelukast (SINGULAIR) 10 MG tablet TAKE 1 TABLET BY MOUTH EVERY DAY (Patient taking differently: Take 10 mg by mouth at bedtime.) 90 tablet 3   Multiple Vitamins-Minerals (ONE-A-DAY WOMENS 50+ ADVANTAGE) TABS Take 1 tablet by mouth daily.     Na Sulfate-K Sulfate-Mg Sulf 17.5-3.13-1.6 GM/177ML SOLN Take per procedure instructions 354 mL 0   OCUVITE LUTEIN 25 25-5 MG CAPS Take 1 capsule by mouth daily.     pantoprazole (PROTONIX) 40 MG tablet Take 1 tablet (40 mg total) by mouth daily. 90 tablet 0   PEG-KCl-NaCl-NaSulf-Na Asc-C (PLENVU) 140 g SOLR Take 1 kit by mouth as directed. Use coupon: BIN: [id number] PNC: CNRX Group: [id number] ID: [id number] 1 each 0   polyethylene glycol powder (GLYCOLAX/MIRALAX) 17 GM/SCOOP powder Take 8.5 g by mouth See admin instructions. Mix 8.5 grams into 4-8 ounces of water and drink by mouth once a day     SYNTHROID 75 MCG tablet TAKE 1 TABLET BY MOUTH EVERY DAY (Patient taking differently: Take 75 mcg by mouth at bedtime.) 90 tablet 1   No current facility-administered medications for this visit.    Allergies as of 11/10/2021 - Review Complete 10/28/2021  Allergen Reaction Noted   Penicillins Hives 12/29/2010   Doxycycline Nausea Only 12/29/2010   Antihistamines, diphenhydramine-type Other (See Comments) 02/10/2016   Diphenhydramine Other (See Comments) 08/18/2011   Lorazepam Other (See Comments) 07/22/2021   Latex Rash and Other (See Comments) 09/04/2013    Family History  Problem Relation Age of Onset   Diabetes Mother    Arthritis Mother    Cancer Father    Diabetes Father    Colon cancer Neg Hx     Social  History   Socioeconomic History   Marital status: Divorced    Spouse name: Not on file   Number of children: Not on file   Years of education: 16   Highest education level: Not on file  Occupational History   Occupation: Retired  Tobacco Use   Smoking status: Never   Smokeless tobacco: Never  Vaping Use   Vaping Use: Never used  Substance  and Sexual Activity   Alcohol use: No    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   Drug use: No   Sexual activity: Not on file  Other Topics Concern   Not on file  Social History Narrative   Regular exercise-no   Caffeine Use-unsure   Social Determinants of Health   Financial Resource Strain: Not on file  Food Insecurity: Not on file  Transportation Needs: Not on file  Physical Activity: Not on file  Stress: Not on file  Social Connections: Not on file  Intimate Partner Violence: Not on file    Review of Systems:    Constitutional: No weight loss, fever or chills Cardiovascular: No chest pain Respiratory: No SOB Gastrointestinal: See HPI and otherwise negative   Physical Exam:  Vital signs: BP 132/78 (BP Location: Right Arm, Patient Position: Sitting, Cuff Size: Normal)    Pulse 90    Ht '4\' 11"'  (1.499 m)    Wt 122 lb 4 oz (55.5 kg)    SpO2 98%    BMI 24.69 kg/m    Constitutional:  Caucasian female appears to be in NAD, Well developed, Well nourished, alert and cooperative Respiratory: Respirations even and unlabored. Lungs clear to auscultation bilaterally.   No wheezes, crackles, or rhonchi.  Cardiovascular: Normal S1, S2. No MRG. Regular rate and rhythm. No peripheral edema, cyanosis or pallor.  Gastrointestinal:  Soft, nondistended, nontender. No rebound or guarding. Normal bowel sounds. No appreciable masses or hepatomegaly. Rectal:  External: external hemorrhoid, Internal: no mass or tenderness  Psychiatric: Oriented to person, place and time. Demonstrates good judgement and reason without abnormal  affect or behaviors.  RELEVANT LABS AND IMAGING: CBC    Component Value Date/Time   WBC 6.6 10/28/2021 1614   RBC 4.04 10/28/2021 1614   HGB 12.5 10/28/2021 1614   HCT 37.6 10/28/2021 1614   PLT 326.0 10/28/2021 1614   MCV 93.1 10/28/2021 1614   MCH 33.6 07/25/2021 0536   MCHC 33.4 10/28/2021 1614   RDW 15.6 (H) 10/28/2021 1614   LYMPHSABS 0.9 10/28/2021 1614   MONOABS 0.7 10/28/2021 1614   EOSABS 0.1 10/28/2021 1614   BASOSABS 0.0 10/28/2021 1614    CMP     Component Value Date/Time   NA 137 10/28/2021 1614   K 4.2 10/28/2021 1614   CL 100 10/28/2021 1614   CO2 30 10/28/2021 1614   GLUCOSE 108 (H) 10/28/2021 1614   BUN 9 10/28/2021 1614   CREATININE 0.74 10/28/2021 1614   CREATININE 0.70 07/17/2012 0950   CALCIUM 9.5 10/28/2021 1614   PROT 6.9 10/28/2021 1614   ALBUMIN 4.0 10/28/2021 1614   AST 14 10/28/2021 1614   ALT 9 10/28/2021 1614   ALKPHOS 76 10/28/2021 1614   BILITOT 0.3 10/28/2021 1614   GFRNONAA >60 07/23/2021 0446    Assessment: 1.  External hemorrhoid: Seen at time of exam today and identified by the patient as the abnormality she is feeling, nonpainful 2.  IDA: EGD in the hospital with ulcer, colonoscopy scheduled  Plan: 1.  Prescribed Hydrocortisone ointment to be applied twice daily x7-14 days to external hemorrhoid to help shrink the tissue. 2.  Patient was given another set of bowel prep instructions by her staff and they did go over them again thoroughly with her and answered questions. 3.  Patient will keep her appointment as scheduled for upcoming colonoscopy. 4.  Patient to follow in clinic per recommendations after time of procedure.  Anderson Malta  Apolonio Schneiders [facility] 11/10/2021, 2:16 PM  Cc: Biagio Borg, MD

## 2021-11-10 NOTE — Patient Instructions (Signed)
If you are age 76 or older, your body mass index should be between 23-30. Your Body mass index is 24.69 kg/m. If this is out of the aforementioned range listed, please consider follow up with your Primary Care Provider. ________________________________________________________  The [facility] providers would like to encourage you to use [facility] to communicate with providers for non-urgent requests or questions.  Due to long hold times on the telephone, sending your provider a message by [facility] may be a faster and more efficient way to get a response.  Please allow 48 business hours for a response.  Please remember that this is for non-urgent requests.  _______________________________________________________  START hydrocortisone cream twice daily applied to the rectum for 7-14 days.  Keep your appointment for your Colonoscopy.  Thank you for entrusting me with your care and choosing [facility].  Ellouise Newer, PA-C

## 2021-11-11 ENCOUNTER — Other Ambulatory Visit: Payer: Self-pay

## 2021-11-11 ENCOUNTER — Ambulatory Visit: Payer: Medicare Other | Admitting: Internal Medicine

## 2021-11-13 ENCOUNTER — Other Ambulatory Visit: Payer: Self-pay | Admitting: Internal Medicine

## 2021-11-16 ENCOUNTER — Telehealth: Payer: Self-pay | Admitting: Physician Assistant

## 2021-11-16 NOTE — Telephone Encounter (Signed)
Patient tried to use the hydrocortisone cream, but had difficulty using it.  Also, there was a rip in the bottom on the tube, so she needs another tube.  She also wants to know if there's an OTC alternative in case she's not able to get another refill.  Please call patient and advise.  Thank you.

## 2021-11-16 NOTE — Telephone Encounter (Signed)
Returned call to patient. She states that she called the pharmacy and they told her to ask if we could send in a refill. Pt states that she still has Hydrocortisone cream left but it is messy and she does not like the applicator. I told her that she doesn't need to pay for an additional prescription if she still has some left in the tube.  I told pt that she could get some gloves and apply a pea sized amount to her rectum BID as recommended. Pt states that she will try that. I told her that Dr. Fuller Plan will reassess her hemorrhoid at the time of her procedure. Pt verbalized understanding and had no concerns at the end of the call.

## 2021-11-17 ENCOUNTER — Telehealth: Payer: Self-pay | Admitting: Physician Assistant

## 2021-11-17 NOTE — Telephone Encounter (Signed)
Patient called and has questions about the prep she was given.  She is confused and just needs some clarification.  If you cannot reach her today, please call her tomorrow.  Please call and advise.  Thank you.

## 2021-11-18 MED ORDER — HYDROCORTISONE (PERIANAL) 2.5 % EX CREA: 1.0000 "application " | TOPICAL_CREAM | Freq: Two times a day (BID) | CUTANEOUS | 0 refills | Status: AC

## 2021-11-18 NOTE — Telephone Encounter (Signed)
Patient called a spoke with patient about prep. She started telling me that she needs a new tube of Anusol. She kept letting me know that she was unsure what had happened to the tube but it was busted and she was still having issues trying to use it. I reassured her I would send in a new tube. She wanted to come to the office show [place] the tube. I advised her that we didn't need to see it, and that I would send in a new script to her pharmacy. She did finally ask about her instructions and if she could eat fruits. I discussed with her that it would not be a good idea. She let me know several times that she was across the road at [facility] and that she is still having issues with bladder leaks. She finally decided that due to her bladder leaks that she would cancel her colonoscopy and call to reschedule once she has her bladder leaks under control. ?

## 2021-11-18 NOTE — Telephone Encounter (Signed)
Patient arrived at the office today with the same concerns that she expressed below regarding her Anusol cream. Pt brought in the tube and the medication was seeping out of the bottom and sides. I told pt that she should take it back to the pharmacy and see if they will replace it for her. She knows that another prescription was sent in for her. Pt questioned wether or not it would come in a different tube or container. I told her that most creams and ointments come in the aluminum tubes. I told her that she could asks the pharmacists when she goes to pick up the replacement. Pt again mentioned that she cancelled her colonoscopy because she wanted to address her bladder leaks first. Pt will call the office if she has any other concerns about the Anusol cream. Pt verbalized understanding of the information provided to her in the office today. ?

## 2021-11-19 ENCOUNTER — Telehealth: Payer: Self-pay | Admitting: Physician Assistant

## 2021-11-19 ENCOUNTER — Encounter: Payer: Self-pay | Admitting: Gastroenterology

## 2021-11-19 NOTE — Telephone Encounter (Signed)
Returned call to patient. She is aware that she can continue her Ensure BID up until the day before her procedure because she will be on clear liquids all day. Pt stated that she had the instructions right in front of her. Pt verbalized understanding of all information and had no concerns at the end of the call. ?

## 2021-11-19 NOTE — Telephone Encounter (Addendum)
Patient called and is seeking advise if she can still drink her insure 2 times daily like her PCP advised her to do leading up to procedure on the 8th. Please advise.  ?

## 2021-11-19 NOTE — Telephone Encounter (Signed)
Returned call to patient. I answered all of her questions regarding her prep. She again asked about her Ensure she was advised that starting on Tuesday, 11/24/21 she will be on a clear liquid diet and can't have the Ensure if it is milk/dairy based. Pt made notations on her instructions of when to stop Ensure. Pt did ask the same questions several times. I confirmed with her that she is reading the instructions correctly. Pt was thankful for the return call. She said that she may have to call back because it is a lot of information. I told her that was fine. Pt had no concerns at the end of the call. ?

## 2021-11-19 NOTE — Telephone Encounter (Signed)
Patient has questions about her prep instructions and what she can and cannot eat.  Please call patient and advise. ?

## 2021-11-23 NOTE — Telephone Encounter (Signed)
Inbound call from patient. Would like a call back. States she need help and need guidance  ?

## 2021-11-23 NOTE — Telephone Encounter (Signed)
Returned call to patient. Spent about 30 minutes on the phone with patient. She reports that she is "nervous" and "jittery". Pt had questions about her Synthroid prescription and told me that she has been taking it at bedtime since she is on PPI daily. She states that she stopped the Synthroid before when she was on a PPI BID. Pt states that she was scared because the pharmacist told her that she was taking the Synthroid incorrectly. I told pt that she should reach out to the prescribing MD, she stated that she does not go to Dr. Jenny Reichmann any longer. I told her that she should still be able to call their office and ask. Pt had a lot of questions regarding the prep again. She asked several times if I could come to her home to help her prep or if anyone from the office could. I told her that we do not offer those services here at the office. She thought that the colonoscopy was just for CRC screening, I expressed to her that they are also checking to see if there is anything causing her anemia. She asked several times what anemia was, I explained this to her multiple times. She said that anemia, sounded like a disease. I told her it it a fairly common medical condition.She asked me several times if she had cancer. I told her that I can't confirm that, I told her that Dr. Fuller Plan will be able to find out more when she comes in for her procedure.  Pt states that she was having a hard time walking, she hadn't bathed in several weeks, and just feels bad. I told her that if she was feeling that poorly she would need to go to the ED for evaluation and we would cancel her procedures. Pt stated that she had 2 cats at home that she loves very much. She said that she would have to have the vet come pick them up if she went to the hospital. I encouraged pt to try to do her best tomorrow, she doesn't think she will do well only on clear liquids. We reviewed her options. I also recommended that she reach out to her care partner to see  if they could come by tomorrow evening when it is time to start her prep. Pt asked if I thought she would be OK. I told her that I am sure she will do fine and to take it one step at a time. Pt will attempt to proceed with colonoscopy as scheduled. I told her to call back if she feels like she can't prep. ?

## 2021-11-24 ENCOUNTER — Telehealth: Payer: Self-pay | Admitting: Physician Assistant

## 2021-11-24 NOTE — Telephone Encounter (Signed)
OK from my standpoint for colonoscopy as scheduled.  They can use a facemask rather than [place] if issues arise from Anesthesia perspective. ?Thanks. ?GM ?

## 2021-11-24 NOTE — Telephone Encounter (Signed)
Patient states she forgot to mention to Ellouise Newer, PA at her office visit that she has blood inside her nose after wiping sometimes and she has an appt with the ENT. Informed patient that this should not be an issue as long as it's not an excessive amount of blood. Patient reports she has had the issue since January and it's only when she has a runny nose and after wiping the inside of her nose. Informed patient that she can proceed with colonoscopy but I will still send to Dr. Rush Landmark since he is doc of the day and patient is concerned.  ?

## 2021-11-24 NOTE — Telephone Encounter (Signed)
Patient called states she has been having nose bleeds and wants to make sure that will be ok for her to still proceed with the colonoscopy. ?

## 2021-11-24 NOTE — Telephone Encounter (Signed)
Informed patient of Dr. Donneta Romberg recommendations. Patient verbalized understanding. ?

## 2021-11-25 ENCOUNTER — Encounter: Payer: Self-pay | Admitting: Gastroenterology

## 2021-11-25 ENCOUNTER — Encounter: Payer: Medicare Other | Admitting: Gastroenterology

## 2021-11-25 ENCOUNTER — Ambulatory Visit (AMBULATORY_SURGERY_CENTER): Payer: Medicare Other | Admitting: Gastroenterology

## 2021-11-25 VITALS — BP 160/53 | HR 94 | Temp 97.1°F | Resp 9 | Ht 59.0 in | Wt 122.0 lb

## 2021-11-25 DIAGNOSIS — K644 Residual hemorrhoidal skin tags: Secondary | ICD-10-CM

## 2021-11-25 DIAGNOSIS — K5730 Diverticulosis of large intestine without perforation or abscess without bleeding: Secondary | ICD-10-CM | POA: Diagnosis not present

## 2021-11-25 DIAGNOSIS — D509 Iron deficiency anemia, unspecified: Secondary | ICD-10-CM | POA: Diagnosis not present

## 2021-11-25 MED ORDER — SODIUM CHLORIDE 0.9 % IV SOLN: 500.0000 mL | Freq: Once | INTRAVENOUS | Status: DC

## 2021-11-25 NOTE — Progress Notes (Signed)
C.W. vital signs. 

## 2021-11-25 NOTE — Progress Notes (Signed)
Report given to PACU, vss 

## 2021-11-25 NOTE — Op Note (Signed)
Max ?Patient Name: Sue Green ?Procedure Date: 11/25/2021 2:44 PM ?MRN: [id number] ?Endoscopist: Ladene Artist , MD ?Age: 76 ?Referring MD:  ?Date of Birth: [birth year] ?Gender: Female ?Account #: [id number] ?Procedure:                Colonoscopy ?Indications:              Unexplained iron deficiency anemia ?Medicines:                Monitored Anesthesia Care ?Procedure:                Pre-Anesthesia Assessment: ?                          - Prior to the procedure, a History and Physical  ?                          was performed, and patient medications and  ?                          allergies were reviewed. The patient's tolerance of  ?                          previous anesthesia was also reviewed. The risks  ?                          and benefits of the procedure and the sedation  ?                          options and risks were discussed with the patient.  ?                          All questions were answered, and informed consent  ?                          was obtained. Prior Anticoagulants: The patient has  ?                          taken no previous anticoagulant or antiplatelet  ?                          agents. ASA Grade Assessment: II - A patient with  ?                          mild systemic disease. After reviewing the risks  ?                          and benefits, the patient was deemed in  ?                          satisfactory condition to undergo the procedure. ?                          After obtaining informed consent, the colonoscope  ?  was passed under direct vision. Throughout the  ?                          procedure, the patient's blood pressure, pulse, and  ?                          oxygen saturations were monitored continuously. The  ?                          Olympus PCF-[id number] (#[id number]) Colonoscope was  ?                          introduced through the anus and advanced to the the  ?                          cecum, identified by  appendiceal orifice and  ?                          ileocecal valve. The ileocecal valve, appendiceal  ?                          orifice, and rectum were photographed. The quality  ?                          of the bowel preparation was good after extensive  ?                          lavage, suction. The colonoscopy was somewhat  ?                          difficult due to a redundant colon, significant  ?                          looping and a tortuous colon. The patient tolerated  ?                          the procedure well. ?Scope In: 3:35:38 PM ?Scope Out: 4:02:50 PM ?Scope Withdrawal Time: 0 hours 15 minutes 51 seconds  ?Total Procedure Duration: 0 hours 27 minutes 12 seconds  ?Findings:                 Skin tags were found on perianal exam. ?                          A few small-mouthed diverticula were found in the  ?                          left colon. There was no evidence of diverticular  ?                          bleeding. ?                          The exam was otherwise without abnormality on  ?  direct and retroflexion views. ?Complications:            No immediate complications. Estimated blood loss:  ?                          None. ?Estimated Blood Loss:     Estimated blood loss: none. ?Impression:               - Perianal skin tags found on perianal exam. ?                          - Mild diverticulosis in the left colon. ?                          - The examination was otherwise normal on direct  ?                          and retroflexion views. ?                          - No specimens collected. ?Recommendation:           - Patient has a contact number available for  ?                          emergencies. The signs and symptoms of potential  ?                          delayed complications were discussed with the  ?                          patient. Return to normal activities tomorrow.  ?                          Written discharge instructions were provided to  the  ?                          patient. ?                          - Resume previous diet. ?                          - Continue present medications. ?                          - No repeat colonoscopy due to age and the absence  ?                          of colonic polyps. ?                          - Return to PCP for ongoing care. ?Ladene Artist, MD ?11/25/2021 4:06:38 PM ?This report has been signed electronically. ?

## 2021-11-25 NOTE — Progress Notes (Signed)
See 11/10/2021 H&P, no changes.  ?

## 2021-11-25 NOTE — Progress Notes (Signed)
Pt's states no medical or surgical changes since previsit or office visit. 

## 2021-11-25 NOTE — Patient Instructions (Signed)
Resume previous diet and medications.  ?No repeat Colonoscopy due to age. ? ?YOU HAD AN ENDOSCOPIC PROCEDURE TODAY AT [facility]:   Refer to the procedure report that was given to you for any specific questions about what was found during the examination.  If the procedure report does not answer your questions, please call your gastroenterologist to clarify.  If you requested that your care partner not be given the details of your procedure findings, then the procedure report has been included in a sealed envelope for you to review at your convenience later. ? ?YOU SHOULD EXPECT: Some feelings of bloating in the abdomen. Passage of more gas than usual.  Walking can help get rid of the air that was put into your GI tract during the procedure and reduce the bloating. If you had a lower endoscopy (such as a colonoscopy or flexible sigmoidoscopy) you may notice spotting of blood in your stool or on the toilet paper. If you underwent a bowel prep for your procedure, you may not have a normal bowel movement for a few days. ? ?Please Note:  You might notice some irritation and congestion in your nose or some drainage.  This is from the oxygen used during your procedure.  There is no need for concern and it should clear up in a day or so. ? ?SYMPTOMS TO REPORT IMMEDIATELY: ? ?Following lower endoscopy (colonoscopy or flexible sigmoidoscopy): ? Excessive amounts of blood in the stool ? Significant tenderness or worsening of abdominal pains ? Swelling of the abdomen that is new, acute ? Fever of 100?F or higher ? ?For urgent or emergent issues, a gastroenterologist can be reached at any hour by calling [phone]. ?Do not use MyChart messaging for urgent concerns.  ? ? ?DIET:  We do recommend a small meal at first, but then you may proceed to your regular diet.  Drink plenty of fluids but you should avoid alcoholic beverages for 24 hours. ? ?ACTIVITY:  You should plan to take it easy for the rest of  today and you should NOT DRIVE or use heavy machinery until tomorrow (because of the sedation medicines used during the test).   ? ?FOLLOW UP: ?Our staff will call the number listed on your records 48-72 hours following your procedure to check on you and address any questions or concerns that you may have regarding the information given to you following your procedure. If we do not reach you, we will leave a message.  We will attempt to reach you two times.  During this call, we will ask if you have developed any symptoms of COVID 19. If you develop any symptoms (ie: fever, flu-like symptoms, shortness of breath, cough etc.) before then, please call [phone].  If you test positive for Covid 19 in the 2 weeks post procedure, please call and report this information to [place].   ? ?If any biopsies were taken you will be contacted by phone or by letter within the next 1-3 weeks.  Please call us at [phone] if you have not heard about the biopsies in 3 weeks.  ? ? ?SIGNATURES/CONFIDENTIALITY: ?You and/or your care partner have signed paperwork which will be entered into your electronic medical record.  These signatures attest to the fact that that the information above on your After Visit Summary has been reviewed and is understood.  Full responsibility of the confidentiality of this discharge information lies with you and/or your care-partner.  ?

## 2021-11-27 ENCOUNTER — Telehealth: Payer: Self-pay

## 2021-11-27 ENCOUNTER — Telehealth: Payer: Self-pay | Admitting: *Deleted

## 2021-11-27 NOTE — Telephone Encounter (Signed)
?  Follow up Call- ? ?Call back number 11/25/2021  ?Post procedure Call Back phone  # [phone]  ?Permission to leave phone message Yes  ?Some recent data might be hidden  ?  ? ?Patient questions: ? ?Do you have a fever, pain , or abdominal swelling? No. ?Pain Score  0 * ? ?Have you tolerated food without any problems? Yes.   ? ?Have you been able to return to your normal activities? Yes.   ? ?Do you have any questions about your discharge instructions: ?Diet   No. ?Medications  No. ?Follow up visit  No. ? ?Do you have questions or concerns about your Care? No. ? ?Actions: ?* If pain score is 4 or above: ?No action needed, pain <4. ? ? ?

## 2021-11-27 NOTE — Telephone Encounter (Signed)
?  Follow up Call- ? ?Call back number 11/25/2021  ?Post procedure Call Back phone  # [phone]  ?Permission to leave phone message Yes  ?Some recent data might be hidden  ?  ?First attempt for follow up phone call. No answer at number given.  Mailbox full and unable to leave a message. ?

## 2021-12-15 DIAGNOSIS — R040 Epistaxis: Secondary | ICD-10-CM | POA: Insufficient documentation

## 2021-12-16 ENCOUNTER — Ambulatory Visit (INDEPENDENT_AMBULATORY_CARE_PROVIDER_SITE_OTHER): Payer: Medicare Other | Admitting: Internal Medicine

## 2021-12-16 ENCOUNTER — Encounter: Payer: Self-pay | Admitting: Internal Medicine

## 2021-12-16 VITALS — BP 126/78 | HR 94 | Resp 18 | Ht 59.0 in | Wt 120.4 lb

## 2021-12-16 DIAGNOSIS — E7800 Pure hypercholesterolemia, unspecified: Secondary | ICD-10-CM | POA: Diagnosis not present

## 2021-12-16 DIAGNOSIS — K8020 Calculus of gallbladder without cholecystitis without obstruction: Secondary | ICD-10-CM

## 2021-12-16 DIAGNOSIS — D1801 Hemangioma of skin and subcutaneous tissue: Secondary | ICD-10-CM

## 2021-12-16 DIAGNOSIS — R198 Other specified symptoms and signs involving the digestive system and abdomen: Secondary | ICD-10-CM | POA: Diagnosis not present

## 2021-12-16 DIAGNOSIS — N3281 Overactive bladder: Secondary | ICD-10-CM | POA: Insufficient documentation

## 2021-12-16 DIAGNOSIS — E039 Hypothyroidism, unspecified: Secondary | ICD-10-CM

## 2021-12-16 DIAGNOSIS — E785 Hyperlipidemia, unspecified: Secondary | ICD-10-CM | POA: Insufficient documentation

## 2021-12-16 DIAGNOSIS — I10 Essential (primary) hypertension: Secondary | ICD-10-CM

## 2021-12-16 NOTE — Assessment & Plan Note (Signed)
D/w pt ok to try the detrol LA but she appears to remain wary and I suspect will not try to take this ?

## 2021-12-16 NOTE — Assessment & Plan Note (Signed)
BP Readings from Last 3 Encounters:  ?12/16/21 126/78  ?11/25/21 (!) 160/53  ?11/10/21 132/78  ? ?Stable, pt to continue medical treatment toprol ? ?

## 2021-12-16 NOTE — Assessment & Plan Note (Signed)
I suspect this is benign issue becoming more noticed with overall other wt loss in addition to spine kyphosis,  to f/u any worsening symptoms or concerns ? ?

## 2021-12-16 NOTE — Assessment & Plan Note (Signed)
Lab Results  ?Component Value Date  ? [place] 78 01/15/2021  ? ?Stable, pt to continue current low chol diet, declines statin ?

## 2021-12-16 NOTE — Progress Notes (Signed)
Patient ID: Sue Green, female   DOB: December 19, 1945, 76 y.o.   MRN: [id number] ? ? ? ?    Chief Complaint: follow up wt loss, abd size increased?, red spots to skin, recent finding gallstones, and urinary frequency ? ?     HPI:  Sue Green is a 76 y.o. female here with recent wt loss and noticed it seems her abd girth may be getting larger; Denies worsening reflux, abd pain, dysphagia, n/v, bowel change or blood.  Does have recent finding gallstones on CT on imaging but denies symptoms.  Has noted several cherry angiomas to arms and wondering if anything to do.  Denies urinary symptoms such as dysuria, urgency, flank pain, hematuria or n/v, fever, chills but has ongoing urinary frequency, tx with detrol LA per urology but states many times she is wary of taking this after reading about possible side effects.   Pt denies chest pain, increased sob or doe, wheezing, orthopnea, PND, increased LE swelling, palpitations, dizziness or syncope.   Pt denies polydipsia, polyuria, or new focal neuro s/s.    Pt denies fever, night sweats, loss of appetite, or other constitutional symptoms  Denies hyper or hypo thyroid symptoms such as voice, skin or hair change.  ?Wt Readings from Last 3 Encounters:  ?12/16/21 120 lb 6.4 oz (54.6 kg)  ?11/25/21 122 lb (55.3 kg)  ?11/10/21 122 lb 4 oz (55.5 kg)  ? ?BP Readings from Last 3 Encounters:  ?12/16/21 126/78  ?11/25/21 (!) 160/53  ?11/10/21 132/78  ? ?      ?Past Medical History:  ?Diagnosis Date  ? Allergy   ? Anal fissure   ? Anemia, iron deficiency 03/06/2014  ? Anxiety   ? Arthritis   ? Asthma   ? B12 deficiency   ? Cholelithiasis   ? Chronic tension headaches   ? IN PAST  ? Depression   ? Duodenal stenosis   ? Duodenal ulcer   ? Fibromyalgia   ? GERD (gastroesophageal reflux disease)   ? Glaucoma   ?  Per pt, she does not have glaucoma.  ? Hepatic cyst   ? Hypertension   ? Hypothyroidism   ? Internal hemorrhoids   ? Iron deficiency anemia   ? Osteopenia   ? Pyloric  stenosis   ? Scoliosis   ? Thyroid disease   ? hypothyroidism  ? Vitamin D deficiency   ? ?Past Surgical History:  ?Procedure Laterality Date  ? APPENDECTOMY  1975  ? BIOPSY  07/24/2021  ? Procedure: BIOPSY;  Surgeon: Ladene Artist, MD;  Location: [facility];  Service: Endoscopy;;  ? ESOPHAGOGASTRODUODENOSCOPY (EGD) WITH PROPOFOL N/A 07/24/2021  ? Procedure: ESOPHAGOGASTRODUODENOSCOPY (EGD) WITH PROPOFOL;  Surgeon: Ladene Artist, MD;  Location: [facility];  Service: Endoscopy;  Laterality: N/A;  ? SHOULDER ARTHROSCOPY  2001  ? rt shoulder  ? TONSILLECTOMY AND ADENOIDECTOMY    ? 76 years old  ? ? reports that she has never smoked. She has never used smokeless tobacco. She reports that she does not drink alcohol and does not use drugs. ?family history includes Arthritis in her mother; Cancer in her father; Diabetes in her father and mother. ?Allergies  ?Allergen Reactions  ? Penicillins Hives  ? Doxycycline Nausea Only  ? Antihistamines, Diphenhydramine-Type Other (See Comments)  ?  Reaction not recalled  ? Diphenhydramine Other (See Comments)  ?  Reaction not recalled  ? Lorazepam Other (See Comments)  ?  Caused trembling in the arms, per the  patient  ? Latex Rash and Other (See Comments)  ?  Patient disputes this in 2022  ? ?Current Outpatient Medications on File Prior to Visit  ?Medication Sig Dispense Refill  ? acetaminophen (TYLENOL) 325 MG tablet Take 2 tablets (650 mg total) by mouth every 6 (six) hours as needed for mild pain (or Fever >/= 101).    ? AMBULATORY NON FORMULARY MEDICATION Nitroglycerine ointment 0.125 %  ?Apply a pea sized amount internally and on lesion three times daily for 6 weeks 30 g 0  ? Artificial Saliva (ACT DRY MOUTH) LOZG Use as directed 1 lozenge in the mouth or throat every 6 (six) hours as needed (for a dry mouth).    ? Ascorbic Acid (VITAMIN C) 500 MG tablet Take 500 mg by mouth daily.    ? busPIRone (BUSPAR) 10 MG tablet Take 10 mg by mouth 2 (two) times daily with a meal.     ? cyclobenzaprine (FLEXERIL) 10 MG tablet TAKE 1 TABLET BY MOUTH EVERY DAY 30 tablet 5  ? desvenlafaxine (PRISTIQ) 100 MG 24 hr tablet Take 100 mg by mouth daily.    ? Ferrous Sulfate (IRON) 28 MG TABS Take 28 mg by mouth daily with breakfast.    ? hydrocortisone (ANUSOL-HC) 2.5 % rectal cream Place 1 application rectally 2 (two) times daily. For 7-14 days 30 g 0  ? Krill Oil 300 MG CAPS Take 300 mg by mouth daily.    ? Methylcellulose, Laxative, (CITRUCEL PO) See admin instructions. Mix 1 tablespoonful of powder into water and drink before breakfast every day    ? metoprolol succinate (TOPROL-XL) 50 MG 24 hr tablet TAKE ONE AND 1/2 TABS BY MOUTH IN THE MORNING (Patient taking differently: Take 75 mg by mouth in the morning.) 135 tablet 3  ? Misc Natural Products (OSTEO BI-FLEX TRIPLE STRENGTH) TABS Take 1 tablet by mouth in the morning and at bedtime.    ? montelukast (SINGULAIR) 10 MG tablet TAKE 1 TABLET BY MOUTH EVERY DAY (Patient taking differently: Take 10 mg by mouth at bedtime.) 90 tablet 3  ? Multiple Vitamins-Minerals (ONE-A-DAY WOMENS 50+ ADVANTAGE) TABS Take 1 tablet by mouth daily.    ? OCUVITE LUTEIN 25 25-5 MG CAPS Take 1 capsule by mouth daily.    ? PEG-KCl-NaCl-NaSulf-Na Asc-C (PLENVU) 140 g SOLR Take 1 kit by mouth as directed. Use coupon: BIN: 993570 PNC: CNRX Group: [id number] ID: [id number] 1 each 0  ? polyethylene glycol powder (GLYCOLAX/MIRALAX) 17 GM/SCOOP powder Take 8.5 g by mouth See admin instructions. Mix 8.5 grams into 4-8 ounces of water and drink by mouth once a day    ? SYNTHROID 75 MCG tablet TAKE 1 TABLET BY MOUTH EVERY DAY (Patient taking differently: Take 75 mcg by mouth at bedtime.) 90 tablet 1  ? pantoprazole (PROTONIX) 40 MG tablet Take 1 tablet (40 mg total) by mouth daily. 90 tablet 0  ? ?No current facility-administered medications on file prior to visit.  ? ?     ROS:  All others reviewed and negative. ? ?Objective  ? ?     PE:  BP 126/78   Pulse 94   Resp 18   Ht 4'  11" (1.499 m)   Wt 120 lb 6.4 oz (54.6 kg)   SpO2 99%   BMI 24.32 kg/m?  ? ?              Constitutional: Pt appears in NAD ?  HENT: Head: NCAT.  ?              Right Ear: External ear normal.   ?              Left Ear: External ear normal.  ?              Eyes: . Pupils are equal, round, and reactive to light. Conjunctivae and EOM are normal ?              Nose: without d/c or deformity ?              Neck: Neck supple. Gross normal ROM ?              Cardiovascular: Normal rate and regular rhythm.   ?              Pulmonary/Chest: Effort normal and breath sounds without rales or wheezing.  ?              Abd:  Soft, NT, ND, + BS, no organomegaly with somewhat protuberant abdomen ow benign, also mild to mod kyphosis of lower thoracic spine noted ?              Neurological: Pt is alert. At baseline orientation, motor grossly intact ?              Skin:  LE edema - none, multiple cherry angioma noted to arms ?              Psychiatric: Pt behavior is normal without agitation  ? ?Micro: none ? ?Cardiac tracings I have personally interpreted today:  none ? ?Pertinent Radiological findings (summarize): none  ? ?Lab Results  ?Component Value Date  ? WBC 6.6 10/28/2021  ? HGB 12.5 10/28/2021  ? HCT 37.6 10/28/2021  ? PLT 326.0 10/28/2021  ? GLUCOSE 108 (H) 10/28/2021  ? CHOL 166 10/28/2021  ? TRIG 288.0 (H) 10/28/2021  ? HDL 38.20 (L) 10/28/2021  ? LDLDIRECT 103.0 10/28/2021  ? LDLCALC 78 01/15/2021  ? ALT 9 10/28/2021  ? AST 14 10/28/2021  ? NA 137 10/28/2021  ? K 4.2 10/28/2021  ? CL 100 10/28/2021  ? CREATININE 0.74 10/28/2021  ? BUN 9 10/28/2021  ? CO2 30 10/28/2021  ? TSH 1.62 10/28/2021  ? HGBA1C 6.1 10/28/2021  ? ?Assessment/Plan:  ?Sue Green is a 76 y.o. White or Caucasian [1] female with  has a past medical history of Allergy, Anal fissure, Anemia, iron deficiency (03/06/2014), Anxiety, Arthritis, Asthma, B12 deficiency, Cholelithiasis, Chronic tension headaches, Depression, Duodenal  stenosis, Duodenal ulcer, Fibromyalgia, GERD (gastroesophageal reflux disease), Glaucoma, Hepatic cyst, Hypertension, Hypothyroidism, Internal hemorrhoids, Iron deficiency anemia, Osteopenia, Pyloric stenosis, [place]

## 2021-12-16 NOTE — Assessment & Plan Note (Signed)
Several noted to arms, benign appearing, pt educated and reassured ?

## 2021-12-16 NOTE — Assessment & Plan Note (Signed)
Lab Results  ?Component Value Date  ? TSH 1.62 10/28/2021  ? ?Stable, pt to continue levothyroxine ? ?

## 2021-12-16 NOTE — Assessment & Plan Note (Signed)
Asysmpt, pt educated and reassured ?

## 2021-12-16 NOTE — Patient Instructions (Signed)
It should be ok to try the generic Detrol LA for the bladder ? ?Your skin spots are called cherry angiomas which are benign and not require any treatment ? ?Your gallstones dont appear to be acting up today ? ?Your overall weight loss and the spine curvature is having some effect on making the stomach area itself seem larger it seems ? ?Please continue all other medications as before, and refills have been done if requested. ? ?Please have the pharmacy call with any other refills you may need. ? ?Please keep your appointments with your specialists as you may have planned ? ?Please make an Appointment to return in 6 months, or sooner if needed ? ? ?

## 2021-12-18 ENCOUNTER — Telehealth: Payer: Self-pay | Admitting: Physician Assistant

## 2021-12-18 NOTE — Telephone Encounter (Signed)
Inbound call from patient stating that she is scared to use the cream that Anderson Malta gave her because she is scared it will " get into her bladder leak". Please advise.  ?

## 2021-12-18 NOTE — Telephone Encounter (Signed)
Returned call to patient. She reports that she has bladder leaks and is scared that if she gets cream on her private area it will "lock up" and cause her to not be able to urinate. I told pt that the cream that Anderson Malta prescribed is to be used rectally only. I told pt that when she uses the restroom she needs to make sure that she is wiping front to back to ensure that no cream is being transferred to her urethral area. I told pt that she can call to urology to see if they have any additional recommendations for her. Pt states that she does not like the office she goes to now. I told her that she could ask her PCP to refer her to a different urologist. Pt verbalized understanding and had no concerns at the end of the call. ?

## 2022-03-01 ENCOUNTER — Ambulatory Visit: Payer: Medicare Other | Admitting: Behavioral Health

## 2022-03-26 ENCOUNTER — Other Ambulatory Visit: Payer: Self-pay | Admitting: Internal Medicine

## 2022-03-26 NOTE — Telephone Encounter (Signed)
,  Please refill as per office routine med refill policy (all routine meds to be refilled for 3 mo or monthly (per pt preference) up to one year from last visit, then month to month grace period for 3 mo, then further med refills will have to be denied)  

## 2022-04-05 ENCOUNTER — Other Ambulatory Visit: Payer: Self-pay | Admitting: Internal Medicine

## 2022-04-05 NOTE — Telephone Encounter (Signed)
Please refill as per office routine med refill policy (all routine meds to be refilled for 3 mo or monthly (per pt preference) up to one year from last visit, then month to month grace period for 3 mo, then further med refills will have to be denied) ? ?

## 2022-04-28 ENCOUNTER — Encounter: Payer: Self-pay | Admitting: Internal Medicine

## 2022-04-28 ENCOUNTER — Telehealth: Payer: Self-pay | Admitting: Internal Medicine

## 2022-04-28 ENCOUNTER — Ambulatory Visit (INDEPENDENT_AMBULATORY_CARE_PROVIDER_SITE_OTHER): Payer: Medicare Other | Admitting: Internal Medicine

## 2022-04-28 VITALS — BP 136/60 | HR 86 | Temp 99.6°F | Ht 59.0 in | Wt 121.6 lb

## 2022-04-28 DIAGNOSIS — D509 Iron deficiency anemia, unspecified: Secondary | ICD-10-CM

## 2022-04-28 DIAGNOSIS — R739 Hyperglycemia, unspecified: Secondary | ICD-10-CM

## 2022-04-28 DIAGNOSIS — R32 Unspecified urinary incontinence: Secondary | ICD-10-CM

## 2022-04-28 DIAGNOSIS — E7800 Pure hypercholesterolemia, unspecified: Secondary | ICD-10-CM | POA: Diagnosis not present

## 2022-04-28 DIAGNOSIS — I10 Essential (primary) hypertension: Secondary | ICD-10-CM

## 2022-04-28 DIAGNOSIS — L609 Nail disorder, unspecified: Secondary | ICD-10-CM

## 2022-04-28 DIAGNOSIS — L900 Lichen sclerosus et atrophicus: Secondary | ICD-10-CM | POA: Insufficient documentation

## 2022-04-28 LAB — HEPATIC FUNCTION PANEL
ALT: 12 U/L (ref 0–35)
AST: 13 U/L (ref 0–37)
Albumin: 4.5 g/dL (ref 3.5–5.2)
Alkaline Phosphatase: 85 U/L (ref 39–117)
Bilirubin, Direct: 0.1 mg/dL (ref 0.0–0.3)
Total Bilirubin: 0.3 mg/dL (ref 0.2–1.2)
Total Protein: 7.4 g/dL (ref 6.0–8.3)

## 2022-04-28 LAB — CBC WITH DIFFERENTIAL/PLATELET
Basophils Absolute: 0 10*3/uL (ref 0.0–0.1)
Basophils Relative: 0.3 % (ref 0.0–3.0)
Eosinophils Absolute: 0.1 10*3/uL (ref 0.0–0.7)
Eosinophils Relative: 1.2 % (ref 0.0–5.0)
HCT: 39.1 % (ref 36.0–46.0)
Hemoglobin: 13.5 g/dL (ref 12.0–15.0)
Lymphocytes Relative: 10 % — ABNORMAL LOW (ref 12.0–46.0)
Lymphs Abs: 1 10*3/uL (ref 0.7–4.0)
MCHC: 34.6 g/dL (ref 30.0–36.0)
MCV: 98.9 fl (ref 78.0–100.0)
Monocytes Absolute: 1 10*3/uL (ref 0.1–1.0)
Monocytes Relative: 10.8 % (ref 3.0–12.0)
Neutro Abs: 7.5 10*3/uL (ref 1.4–7.7)
Neutrophils Relative %: 77.7 % — ABNORMAL HIGH (ref 43.0–77.0)
Platelets: 342 10*3/uL (ref 150.0–400.0)
RBC: 3.96 Mil/uL (ref 3.87–5.11)
RDW: 13.1 % (ref 11.5–15.5)
WBC: 9.6 10*3/uL (ref 4.0–10.5)

## 2022-04-28 LAB — IBC PANEL
Iron: 85 ug/dL (ref 42–145)
Saturation Ratios: 20.6 % (ref 20.0–50.0)
TIBC: 413 ug/dL (ref 250.0–450.0)
Transferrin: 295 mg/dL (ref 212.0–360.0)

## 2022-04-28 LAB — BASIC METABOLIC PANEL
BUN: 20 mg/dL (ref 6–23)
CO2: 27 mEq/L (ref 19–32)
Calcium: 9.4 mg/dL (ref 8.4–10.5)
Chloride: 98 mEq/L (ref 96–112)
Creatinine, Ser: 0.61 mg/dL (ref 0.40–1.20)
GFR: 86.81 mL/min (ref >60.00)
Glucose, Bld: 90 mg/dL (ref 70–99)
Potassium: 4.5 mEq/L (ref 3.5–5.1)
Sodium: 132 mEq/L — ABNORMAL LOW (ref 135–145)

## 2022-04-28 LAB — LIPID PANEL
Cholesterol: 142 mg/dL (ref 0–200)
HDL: 34.6 mg/dL — ABNORMAL LOW (ref >39.00)
NonHDL: 107.02
Total CHOL/HDL Ratio: 4
Triglycerides: 249 mg/dL — ABNORMAL HIGH (ref 0.0–149.0)
VLDL: 49.8 mg/dL — ABNORMAL HIGH (ref 0.0–40.0)

## 2022-04-28 LAB — HEMOGLOBIN A1C: Hgb A1c MFr Bld: 5.6 % (ref 4.6–6.5)

## 2022-04-28 LAB — LDL CHOLESTEROL, DIRECT: Direct LDL: 91 mg/dL

## 2022-04-28 LAB — FERRITIN: Ferritin: 42.8 ng/mL (ref 10.0–291.0)

## 2022-04-28 NOTE — Assessment & Plan Note (Signed)
No recent overt bleeding, for f/u labs iron level

## 2022-04-28 NOTE — Assessment & Plan Note (Signed)
Lab Results  Component Value Date   HGBA1C 6.1 10/28/2021   Stable, pt to continue current medical treatment  - diet, wt control

## 2022-04-28 NOTE — Progress Notes (Signed)
Patient ID: Sue Green, female   DOB: [birth year], 76 y.o.   MRN: [id number]        Chief Complaint: follow up urinary incontinence, nail disorder, hyperglcemia, iron def anemia, hld       HPI:  Sue Green is a 76 y.o. female here overall doing ok but needs podiatry  referral for ingrown and long nail disorder having seen derm recently and told this wasn't in their scope of care.  Denies urinary symptoms such as dysuria, frequency, urgency, flank pain, hematuria or n/v, fever, chills, but has ongoing mild incontinence, has appt with urology in 2 days, but asking also for UA today.  Pt denies chest pain, increased sob or doe, wheezing, orthopnea, PND, increased LE swelling, palpitations, dizziness or syncope.   Pt denies polydipsia, polyuria, or new focal neuro s/s.   No overt bleeding or bruising. Still laments her thinning skin of the arms and inability to regain wt despite boost twice per day.  Highly irritable and emotional today more than her baseline, then angry when I suggest her aging is the primary issue, and somehow I am just blaming this on aging out of convenience, scoffing and angry today  Wt Readings from Last 3 Encounters:  04/28/22 121 lb 9.6 oz (55.2 kg)  12/16/21 120 lb 6.4 oz (54.6 kg)  11/25/21 122 lb (55.3 kg)   BP Readings from Last 3 Encounters:  04/28/22 136/60  12/16/21 126/78  11/25/21 (!) 160/53         Past Medical History:  Diagnosis Date   Allergy    Anal fissure    Anemia, iron deficiency 03/06/2014   Anxiety    Arthritis    Asthma    B12 deficiency    Cholelithiasis    Chronic tension headaches    IN PAST   Depression    Duodenal stenosis    Duodenal ulcer    Fibromyalgia    GERD (gastroesophageal reflux disease)    Glaucoma     Per pt, she does not have glaucoma.   Hepatic cyst    Hypertension    Hypothyroidism    Internal hemorrhoids    Iron deficiency anemia    Osteopenia    Pyloric stenosis    Scoliosis    Thyroid disease     hypothyroidism   Vitamin D deficiency    Past Surgical History:  Procedure Laterality Date   APPENDECTOMY  1975   BIOPSY  07/24/2021   Procedure: BIOPSY;  Surgeon: Ladene Artist, MD;  Location: [facility];  Service: Endoscopy;;   ESOPHAGOGASTRODUODENOSCOPY (EGD) WITH PROPOFOL N/A 07/24/2021   Procedure: ESOPHAGOGASTRODUODENOSCOPY (EGD) WITH PROPOFOL;  Surgeon: Ladene Artist, MD;  Location: [facility];  Service: Endoscopy;  Laterality: N/A;   SHOULDER ARTHROSCOPY  2001   rt shoulder   TONSILLECTOMY AND ADENOIDECTOMY     76 years old    reports that she has never smoked. She has never used smokeless tobacco. She reports that she does not drink alcohol and does not use drugs. family history includes Arthritis in her mother; Cancer in her father; Diabetes in her father and mother. Allergies  Allergen Reactions   Penicillins Hives   Doxycycline Nausea Only   Antihistamines, Diphenhydramine-Type Other (See Comments)    Reaction not recalled   Diphenhydramine Other (See Comments)    Reaction not recalled   Lorazepam Other (See Comments)    Caused trembling in the arms, per the patient   Latex Rash and Other (See  Comments)    Patient disputes this in 2022   Current Outpatient Medications on File Prior to Visit  Medication Sig Dispense Refill   acetaminophen (TYLENOL) 325 MG tablet Take 2 tablets (650 mg total) by mouth every 6 (six) hours as needed for mild pain (or Fever >/= 101).     AMBULATORY NON FORMULARY MEDICATION Nitroglycerine ointment 0.125 %  Apply a pea sized amount internally and on lesion three times daily for 6 weeks 30 g 0   Artificial Saliva (ACT DRY MOUTH) LOZG Use as directed 1 lozenge in the mouth or throat every 6 (six) hours as needed (for a dry mouth).     Ascorbic Acid (VITAMIN C) 500 MG tablet Take 500 mg by mouth daily.     cyclobenzaprine (FLEXERIL) 10 MG tablet TAKE 1 TABLET BY MOUTH EVERY DAY 30 tablet 5   Ferrous Sulfate (IRON) 28 MG TABS Take 28 mg  by mouth daily with breakfast.     Methylcellulose, Laxative, (CITRUCEL PO) See admin instructions. Mix 1 tablespoonful of powder into water and drink before breakfast every day     metoprolol succinate (TOPROL-XL) 50 MG 24 hr tablet TAKE ONE AND 1/2 TABS BY MOUTH IN THE MORNING 135 tablet 3   Misc Natural Products (OSTEO BI-FLEX TRIPLE STRENGTH) TABS Take 1 tablet by mouth in the morning and at bedtime.     montelukast (SINGULAIR) 10 MG tablet TAKE 1 TABLET BY MOUTH EVERY DAY 90 tablet 3   Multiple Vitamins-Minerals (ONE-A-DAY WOMENS 50+ ADVANTAGE) TABS Take 1 tablet by mouth daily.     Neomycin-Bacitracin-Polymyxin (NEOSPORIN ORIGINAL EX) Apply topically.     OCUVITE LUTEIN 25 25-5 MG CAPS Take 1 capsule by mouth daily.     polyethylene glycol powder (GLYCOLAX/MIRALAX) 17 GM/SCOOP powder Take 8.5 g by mouth See admin instructions. Mix 8.5 grams into 4-8 ounces of water and drink by mouth once a day     SYNTHROID 75 MCG tablet TAKE 1 TABLET BY MOUTH EVERY DAY 90 tablet 0   busPIRone (BUSPAR) 10 MG tablet Take 10 mg by mouth 2 (two) times daily with a meal. (Patient not taking: Reported on 04/28/2022)     desvenlafaxine (PRISTIQ) 100 MG 24 hr tablet Take 100 mg by mouth daily. (Patient not taking: Reported on 04/28/2022)     hydrocortisone (ANUSOL-HC) 2.5 % rectal cream Place 1 application rectally 2 (two) times daily. For 7-14 days (Patient not taking: Reported on 04/28/2022) 30 g 0   Krill Oil 300 MG CAPS Take 300 mg by mouth daily. (Patient not taking: Reported on 04/28/2022)     mupirocin ointment (BACTROBAN) 2 % 3 (three) times daily. (Patient not taking: Reported on 04/28/2022)     pantoprazole (PROTONIX) 40 MG tablet Take 1 tablet (40 mg total) by mouth daily. 90 tablet 0   No current facility-administered medications on file prior to visit.        ROS:  All others reviewed and negative.  Objective        PE:  BP 136/60 (BP Location: Right Arm, Patient Position: Sitting, Cuff Size: Normal)    Pulse 86   Temp 99.6 F (37.6 C) (Oral)   Ht '4\' 11"'$  (1.499 m)   Wt 121 lb 9.6 oz (55.2 kg)   SpO2 96%   BMI 24.56 kg/m                 Constitutional: Pt appears in NAD  HENT: Head: NCAT.                Right Ear: External ear normal.                 Left Ear: External ear normal.                Eyes: . Pupils are equal, round, and reactive to light. Conjunctivae and EOM are normal               Nose: without d/c or deformity               Neck: Neck supple. Gross normal ROM               Cardiovascular: Normal rate and regular rhythm.                 Pulmonary/Chest: Effort normal and breath sounds without rales or wheezing.                Abd:  Soft, NT, ND, + BS, no organomegaly               Neurological: Pt is alert. At baseline orientation, motor grossly intact               Skin: Skin is warm. No rashes, no other new lesions, LE edema - none               Psychiatric: Pt behavior is normal without agitation   Micro: none  Cardiac tracings I have personally interpreted today:  none  Pertinent Radiological findings (summarize): none   Lab Results  Component Value Date   WBC 6.6 10/28/2021   HGB 12.5 10/28/2021   HCT 37.6 10/28/2021   PLT 326.0 10/28/2021   GLUCOSE 108 (H) 10/28/2021   CHOL 166 10/28/2021   TRIG 288.0 (H) 10/28/2021   HDL 38.20 (L) 10/28/2021   LDLDIRECT 103.0 10/28/2021   LDLCALC 78 01/15/2021   ALT 9 10/28/2021   AST 14 10/28/2021   NA 137 10/28/2021   K 4.2 10/28/2021   CL 100 10/28/2021   CREATININE 0.74 10/28/2021   BUN 9 10/28/2021   CO2 30 10/28/2021   TSH 1.62 10/28/2021   HGBA1C 6.1 10/28/2021   Assessment/Plan:  AERIS HERSMAN is a 76 y.o. White or Caucasian [1] female with  has a past medical history of Allergy, Anal fissure, Anemia, iron deficiency (03/06/2014), Anxiety, Arthritis, Asthma, B12 deficiency, Cholelithiasis, Chronic tension headaches, Depression, Duodenal stenosis, Duodenal ulcer, Fibromyalgia, GERD  (gastroesophageal reflux disease), Glaucoma, Hepatic cyst, Hypertension, Hypothyroidism, Internal hemorrhoids, Iron deficiency anemia, Osteopenia, Pyloric stenosis, Scoliosis, Thyroid disease, and Vitamin D deficiency.  Anemia, iron deficiency No recent overt bleeding, for f/u labs iron level  HLD (hyperlipidemia) Lab Results  Component Value Date   LDLCALC 78 01/15/2021   Stable, pt to continue current low chol diet   Essential hypertension BP Readings from Last 3 Encounters:  04/28/22 136/60  12/16/21 126/78  11/25/21 (!) 160/53   Stable, pt to continue medical treatment toprol xl 50 qd   Urinary incontinence Ok for UA today,  to f/u any worsening symptoms or concerns with urology aug 11 as planned  Nail disorder Will need podiatry referral,  to f/u any worsening symptoms or concerns  Hyperglycemia Lab Results  Component Value Date   HGBA1C 6.1 10/28/2021   Stable, pt to continue current medical treatment  - diet, wt control  Followup: Return in about 6 months (around 10/29/2022).  Cathlean Cower, MD 04/28/2022 8:31 PM [facility]

## 2022-04-28 NOTE — Telephone Encounter (Signed)
Ok with me 

## 2022-04-28 NOTE — Assessment & Plan Note (Signed)
Ok for UA today,  to f/u any worsening symptoms or concerns with urology aug 11 as planned

## 2022-04-28 NOTE — Patient Instructions (Signed)
Please continue all other medications as before  Please have the pharmacy call with any other refills you may need.  Please continue your efforts at being more active, low cholesterol diet, and weight control.  Please keep your appointments with your specialists as you may have planned  You will be contacted regarding the referral for: podiatry (foot doctor)  Please go to the LAB at the blood drawing area for the tests to be done  You will be contacted by phone if any changes need to be made immediately.  Otherwise, you will receive a letter about your results with an explanation, but please check with MyChart first.  Please remember to sign up for MyChart if you have not done so, as this will be important to you in the future with finding out test results, communicating by private email, and scheduling acute appointments online when needed.  Please make an Appointment to return in 6 months, or sooner if needed

## 2022-04-28 NOTE — Assessment & Plan Note (Signed)
Will need podiatry referral,  to f/u any worsening symptoms or concerns

## 2022-04-28 NOTE — Assessment & Plan Note (Signed)
Lab Results  Component Value Date   LDLCALC 78 01/15/2021   Stable, pt to continue current low chol diet

## 2022-04-28 NOTE — Assessment & Plan Note (Signed)
BP Readings from Last 3 Encounters:  04/28/22 136/60  12/16/21 126/78  11/25/21 (!) 160/53   Stable, pt to continue medical treatment toprol xl 50 qd

## 2022-04-28 NOTE — Telephone Encounter (Signed)
Pt is requesting a TOC from [facility] to [facility].

## 2022-04-29 ENCOUNTER — Encounter: Payer: Self-pay | Admitting: Internal Medicine

## 2022-04-29 LAB — URINE CULTURE: Result:: NO GROWTH

## 2022-04-29 LAB — URINALYSIS, ROUTINE W REFLEX MICROSCOPIC
Bilirubin Urine: NEGATIVE
Hgb urine dipstick: NEGATIVE
Ketones, ur: NEGATIVE
Leukocytes,Ua: NEGATIVE
Nitrite: NEGATIVE
Specific Gravity, Urine: <=1.005 — AB (ref 1.000–1.030)
Total Protein, Urine: NEGATIVE
Urine Glucose: NEGATIVE
Urobilinogen, UA: 0.2 (ref 0.0–1.0)
pH: 6.5 (ref 5.0–8.0)

## 2022-05-18 ENCOUNTER — Ambulatory Visit: Payer: Self-pay | Admitting: Podiatry

## 2022-05-31 ENCOUNTER — Telehealth: Payer: Self-pay | Admitting: Internal Medicine

## 2022-05-31 ENCOUNTER — Ambulatory Visit: Payer: Self-pay | Admitting: Podiatry

## 2022-05-31 MED ORDER — MONTELUKAST SODIUM 10 MG PO TABS: 10.0000 mg | ORAL_TABLET | Freq: Every day | ORAL | 3 refills | Status: DC

## 2022-05-31 NOTE — Telephone Encounter (Signed)
Patient needs her singulair 10 mg. Tablets sent to [facility] on [address] in [place].

## 2022-06-01 ENCOUNTER — Ambulatory Visit: Payer: Self-pay | Admitting: Podiatry

## 2022-06-09 ENCOUNTER — Telehealth: Payer: Self-pay

## 2022-06-09 ENCOUNTER — Ambulatory Visit (HOSPITAL_COMMUNITY)
Admission: EM | Admit: 2022-06-09 | Discharge: 2022-06-09 | Disposition: A | Discharge status: Home / Self Care | Payer: Medicare Other | Attending: Family Medicine | Admitting: Family Medicine

## 2022-06-09 ENCOUNTER — Encounter (HOSPITAL_COMMUNITY): Payer: Self-pay | Admitting: Emergency Medicine

## 2022-06-09 DIAGNOSIS — Z23 Encounter for immunization: Secondary | ICD-10-CM

## 2022-06-09 DIAGNOSIS — W5501XA Bitten by cat, initial encounter: Secondary | ICD-10-CM

## 2022-06-09 DIAGNOSIS — M79645 Pain in left finger(s): Secondary | ICD-10-CM

## 2022-06-09 MED ORDER — TETANUS-DIPHTH-ACELL PERTUSSIS 5-2.5-18.5 LF-MCG/0.5 IM SUSY
0.5000 mL | PREFILLED_SYRINGE | Freq: Once | INTRAMUSCULAR | Status: AC
  Administered 2022-06-09: 0.5 mL via INTRAMUSCULAR

## 2022-06-09 MED ORDER — TETANUS-DIPHTH-ACELL PERTUSSIS 5-2.5-18.5 LF-MCG/0.5 IM SUSY
PREFILLED_SYRINGE | INTRAMUSCULAR | Status: AC
  Filled 2022-06-09: qty 0.5

## 2022-06-09 MED ORDER — DOXYCYCLINE HYCLATE 100 MG PO CAPS: 100.0000 mg | ORAL_CAPSULE | Freq: Two times a day (BID) | ORAL | 0 refills | Status: DC

## 2022-06-09 MED ORDER — ONDANSETRON 4 MG PO TBDP: 4.0000 mg | ORAL_TABLET | Freq: Three times a day (TID) | ORAL | 0 refills | Status: DC | PRN

## 2022-06-09 NOTE — ED Provider Notes (Signed)
[place]   [id number] 06/09/22 Arrival Time: 7124  ASSESSMENT & PLAN:  1. Thumb pain, left   2. Cat bite, initial encounter    Small puncture.  New Prescriptions   DOXYCYCLINE (VIBRAMYCIN) 100 MG CAPSULE    Take 1 capsule (100 mg total) by mouth 2 (two) times daily.   ONDANSETRON (ZOFRAN-ODT) 4 MG DISINTEGRATING TABLET    Take 1 tablet (4 mg total) by mouth every 8 (eight) hours as needed for nausea or vomiting.   Discussed s/s of infection, should this occur.  Recommend:  Follow-up Information     Biagio Borg, MD.   Specialties: Internal Medicine, Radiology Why: As needed. Contact information: [place] [address] [phone]                 Reviewed expectations re: course of current medical issues. Questions answered. Outlined signs and symptoms indicating need for more acute intervention. Patient verbalized understanding. After Visit Summary given.  SUBJECTIVE: History from: patient. Sue Green is a 76 y.o. female who reports cat bite; today; L thumb; mild bleeding that was easily controlled. Requests Td. No extremity sensation changes or weakness.  Washed well after bite. Her cat; UTD on shots.  Past Surgical History:  Procedure Laterality Date   APPENDECTOMY  1975   BIOPSY  07/24/2021   Procedure: BIOPSY;  Surgeon: Ladene Artist, MD;  Location: [facility];  Service: Endoscopy;;   ESOPHAGOGASTRODUODENOSCOPY (EGD) WITH PROPOFOL N/A 07/24/2021   Procedure: ESOPHAGOGASTRODUODENOSCOPY (EGD) WITH PROPOFOL;  Surgeon: Ladene Artist, MD;  Location: [facility];  Service: Endoscopy;  Laterality: N/A;   SHOULDER ARTHROSCOPY  2001   rt shoulder   TONSILLECTOMY AND ADENOIDECTOMY     76 years old      OBJECTIVE:  Vitals:   06/09/22 1538  BP: (!) 143/60  Pulse: 82  Resp: 18  Temp: 98.2 F (36.8 C)  TempSrc: Oral  SpO2: 100%    General appearance: alert; no distress HEENT: [name]; AT Neck: supple with  FROM Resp: unlabored respirations Extremities: LUE: warm with well perfused appearance; small puncture at base of L thumb; FROM; no bleeding; no erythema or signs of infection CV: brisk extremity capillary refill of LUE; 2+ radial pulse of LUE. Skin: warm and dry; no visible rashes Neurologic: gait normal; normal sensation and strength of LUE Psychological: alert and cooperative; normal mood and affect   Allergies  Allergen Reactions   Penicillins Hives   Doxycycline Nausea Only   Antihistamines, Diphenhydramine-Type Other (See Comments)    Reaction not recalled   Diphenhydramine Other (See Comments)    Reaction not recalled   Lorazepam Other (See Comments)    Caused trembling in the arms, per the patient   Latex Rash and Other (See Comments)    Patient disputes this in 2022    Past Medical History:  Diagnosis Date   Allergy    Anal fissure    Anemia, iron deficiency 03/06/2014   Anxiety    Arthritis    Asthma    B12 deficiency    Cholelithiasis    Chronic tension headaches    IN PAST   Depression    Duodenal stenosis    Duodenal ulcer    Fibromyalgia    GERD (gastroesophageal reflux disease)    Glaucoma     Per pt, she does not have glaucoma.   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   Drug use: No   Sexual activity: Not on  file  Other Topics Concern   Not on file  Social History Narrative   Regular exercise-no   Caffeine Use-unsure   Social Determinants of Health   Financial Resource Strain: Not on file  Food Insecurity: Not on file  Transportation Needs: Not on file  Physical Activity: Not on file  Stress: Not on file  Social Connections: Not on file   Family History  Problem Relation Age of Onset   Diabetes Mother    Arthritis Mother    Cancer Father    Diabetes Father    Colon cancer Neg Hx    Past Surgical History:  Procedure Laterality Date   APPENDECTOMY  1975   BIOPSY  07/24/2021   Procedure: BIOPSY;  Surgeon: Ladene Artist, MD;  Location: [facility];  Service: Endoscopy;;   ESOPHAGOGASTRODUODENOSCOPY (EGD) WITH PROPOFOL N/A 07/24/2021   Procedure: ESOPHAGOGASTRODUODENOSCOPY (EGD) WITH PROPOFOL;  Surgeon: Ladene Artist, MD;  Location: [facility];  Service: Endoscopy;  Laterality: N/A;   SHOULDER ARTHROSCOPY  2001   rt shoulder   TONSILLECTOMY AND ADENOIDECTOMY     76 years old       Vanessa Kick, MD 06/09/22 1606

## 2022-06-09 NOTE — ED Triage Notes (Signed)
Pt reports a cat bite on left thumb. Bleeding controlled. States she is the owner of the cat and cat is UTD on vaccines. Requesting a Tetanus shot. Denies any other complaints

## 2022-06-09 NOTE — Telephone Encounter (Signed)
Patient called in stating she was bit by her cat when giving them medication. Cacey said her cat is up to date with his shots, but isnt up to date on her tetanus. Patient is very worried that she is going to die if she doesn't get help right away, advised patient speak with access nurse.

## 2022-06-09 NOTE — Discharge Instructions (Signed)
Meds ordered this encounter  Medications   Tdap (BOOSTRIX) injection 0.5 mL   doxycycline (VIBRAMYCIN) 100 MG capsule    Sig: Take 1 capsule (100 mg total) by mouth 2 (two) times daily.    Dispense:  14 capsule    Refill:  0   ondansetron (ZOFRAN-ODT) 4 MG disintegrating tablet    Sig: Take 1 tablet (4 mg total) by mouth every 8 (eight) hours as needed for nausea or vomiting.    Dispense:  15 tablet    Refill:  0

## 2022-06-09 NOTE — Telephone Encounter (Signed)
Please advise 

## 2022-06-09 NOTE — Telephone Encounter (Signed)
Needs OV - any provider

## 2022-06-21 ENCOUNTER — Ambulatory Visit (INDEPENDENT_AMBULATORY_CARE_PROVIDER_SITE_OTHER): Payer: Medicare Other | Admitting: Podiatry

## 2022-06-21 DIAGNOSIS — M79675 Pain in left toe(s): Secondary | ICD-10-CM

## 2022-06-21 DIAGNOSIS — M79674 Pain in right toe(s): Secondary | ICD-10-CM

## 2022-06-21 DIAGNOSIS — B351 Tinea unguium: Secondary | ICD-10-CM | POA: Diagnosis not present

## 2022-06-21 NOTE — Progress Notes (Signed)
Subjective:   Patient ID: Sue Green, female   DOB: 76 y.o.   MRN: [id number]   HPI Chief Complaint  Patient presents with   Nail Problem    Patient has a rip in the right hallux nail, would like a nail trim today     76 year old female with the above concerns.  States she has a "tear" in the right big toenail. She states it has grown out minimally. This started about August/September. She saw dermatologist and referred here. When she trimes the nails they seem "flaky".  No edema, erythema.  She does get some burning to her feet.   Review of Systems  All other systems reviewed and are negative.  Past Medical History:  Diagnosis Date   Allergy    Anal fissure    Anemia, iron deficiency 03/06/2014   Anxiety    Arthritis    Asthma    B12 deficiency    Cholelithiasis    Chronic tension headaches    IN PAST   Depression    Duodenal stenosis    Duodenal ulcer    Fibromyalgia    GERD (gastroesophageal reflux disease)    Glaucoma     Per pt, she does not have glaucoma.   Hepatic cyst    Hypertension    Hypothyroidism    Internal hemorrhoids    Iron deficiency anemia    Osteopenia    Pyloric stenosis    Scoliosis    Thyroid disease    hypothyroidism   Vitamin D deficiency     Past Surgical History:  Procedure Laterality Date   APPENDECTOMY  1975   BIOPSY  07/24/2021   Procedure: BIOPSY;  Surgeon: Ladene Artist, MD;  Location: [facility];  Service: Endoscopy;;   ESOPHAGOGASTRODUODENOSCOPY (EGD) WITH PROPOFOL N/A 07/24/2021   Procedure: ESOPHAGOGASTRODUODENOSCOPY (EGD) WITH PROPOFOL;  Surgeon: Ladene Artist, MD;  Location: [facility];  Service: Endoscopy;  Laterality: N/A;   SHOULDER ARTHROSCOPY  2001   rt shoulder   TONSILLECTOMY AND ADENOIDECTOMY     76 years old     Current Outpatient Medications:    acetaminophen (TYLENOL) 325 MG tablet, Take 2 tablets (650 mg total) by mouth every 6 (six) hours as needed for mild pain (or Fever >/= 101)., Disp: ,  Rfl:    AMBULATORY NON FORMULARY MEDICATION, Nitroglycerine ointment 0.125 %  Apply a pea sized amount internally and on lesion three times daily for 6 weeks, Disp: 30 g, Rfl: 0   Artificial Saliva (ACT DRY MOUTH) LOZG, Use as directed 1 lozenge in the mouth or throat every 6 (six) hours as needed (for a dry mouth)., Disp: , Rfl:    Ascorbic Acid (VITAMIN C) 500 MG tablet, Take 500 mg by mouth daily., Disp: , Rfl:    busPIRone (BUSPAR) 10 MG tablet, Take 10 mg by mouth 2 (two) times daily with a meal. (Patient not taking: Reported on 04/28/2022), Disp: , Rfl:    cyclobenzaprine (FLEXERIL) 10 MG tablet, TAKE 1 TABLET BY MOUTH EVERY DAY (Patient not taking: Reported on 06/21/2022), Disp: 30 tablet, Rfl: 5   desvenlafaxine (PRISTIQ) 100 MG 24 hr tablet, Take 100 mg by mouth daily. (Patient not taking: Reported on 04/28/2022), Disp: , Rfl:    doxycycline (VIBRAMYCIN) 100 MG capsule, Take 1 capsule (100 mg total) by mouth 2 (two) times daily. (Patient not taking: Reported on 06/21/2022), Disp: 14 capsule, Rfl: 0   Ferrous Sulfate (IRON) 28 MG TABS, Take 28 mg by mouth daily  with breakfast., Disp: , Rfl:    hydrocortisone (ANUSOL-HC) 2.5 % rectal cream, Place 1 application rectally 2 (two) times daily. For 7-14 days (Patient not taking: Reported on 04/28/2022), Disp: 30 g, Rfl: 0   Krill Oil 300 MG CAPS, Take 300 mg by mouth daily. (Patient not taking: Reported on 04/28/2022), Disp: , Rfl:    Methylcellulose, Laxative, (CITRUCEL PO), See admin instructions. Mix 1 tablespoonful of powder into water and drink before breakfast every day, Disp: , Rfl:    metoprolol succinate (TOPROL-XL) 50 MG 24 hr tablet, TAKE ONE AND 1/2 TABS BY MOUTH IN THE MORNING, Disp: 135 tablet, Rfl: 3   Misc Natural Products (OSTEO BI-FLEX TRIPLE STRENGTH) TABS, Take 1 tablet by mouth in the morning and at bedtime., Disp: , Rfl:    montelukast (SINGULAIR) 10 MG tablet, Take 1 tablet (10 mg total) by mouth daily., Disp: 90 tablet, Rfl: 3    Multiple Vitamins-Minerals (ONE-A-DAY WOMENS 50+ ADVANTAGE) TABS, Take 1 tablet by mouth daily., Disp: , Rfl:    mupirocin ointment (BACTROBAN) 2 %, 3 (three) times daily. (Patient not taking: Reported on 04/28/2022), Disp: , Rfl:    Neomycin-Bacitracin-Polymyxin (NEOSPORIN ORIGINAL EX), Apply topically., Disp: , Rfl:    OCUVITE LUTEIN 25 25-5 MG CAPS, Take 1 capsule by mouth daily., Disp: , Rfl:    ondansetron (ZOFRAN-ODT) 4 MG disintegrating tablet, Take 1 tablet (4 mg total) by mouth every 8 (eight) hours as needed for nausea or vomiting., Disp: 15 tablet, Rfl: 0   pantoprazole (PROTONIX) 40 MG tablet, Take 1 tablet (40 mg total) by mouth daily., Disp: 90 tablet, Rfl: 0   polyethylene glycol powder (GLYCOLAX/MIRALAX) 17 GM/SCOOP powder, Take 8.5 g by mouth See admin instructions. Mix 8.5 grams into 4-8 ounces of water and drink by mouth once a day, Disp: , Rfl:    SYNTHROID 75 MCG tablet, TAKE 1 TABLET BY MOUTH EVERY DAY, Disp: 90 tablet, Rfl: 0  Allergies  Allergen Reactions   Penicillins Hives   Doxycycline Nausea Only   Antihistamines, Diphenhydramine-Type Other (See Comments)    Reaction not recalled   Diphenhydramine Other (See Comments)    Reaction not recalled   Lorazepam Other (See Comments)    Caused trembling in the arms, per the patient   Latex Rash and Other (See Comments)    Patient disputes this in 2022          Objective:  Physical Exam  General: AAO x3, NAD  Dermatological: Toenails are mildly hypertrophic, dystrophic with yellow, brown discoloration.  The nails are brittle.  She does get tenderness to the nails 1-5 bilaterally.  No open lesions.  Vascular: Dorsalis Pedis artery and Posterior Tibial artery pedal pulses are 2/4 bilateral with immedate capillary fill time.  There is no pain with calf compression, swelling, warmth, erythema.   Neruologic: Grossly intact via light touch bilateral.   Musculoskeletal: No other areas of pinpoint tenderness.  Gait:  Unassisted, Nonantalgic.       Assessment:   Symptomatic onychomycosis, possible neuropathy     Plan:  -Treatment options discussed including all alternatives, risks, and complications -Etiology of symptoms were discussed -Nails debrided 10 without complications or bleeding.  Discussed different treatment options for the nails.  Ultimately she is using topical biotin solution to help discussed other treatment options as well. -Discussed different options for the sensations in her feet.  Discussed starting with capsaicin cream. -Daily foot inspection -Follow-up in 3 months or sooner if any problems arise. In the  meantime, encouraged to call the office with any questions, concerns, change in symptoms.   Celesta Gentile, DPM

## 2022-06-21 NOTE — Patient Instructions (Signed)
For the toenail you can use "biotin" topical solution to help  For the sensations in your feet you can use capsaicin cream.

## 2022-06-25 ENCOUNTER — Telehealth: Payer: Self-pay

## 2022-06-25 NOTE — Telephone Encounter (Signed)
MEDICATION: cyclobenzaprine (FLEXERIL) 10 MG tablet   PHARMACY: [id number]- [place], [place] - [address]  Comments[facility] [facility] with no response. Patient states she does not know why it is marked she doesn't take it. Patient is completely out.   **Let patient know to contact pharmacy at the end of the day to make sure medication is ready. **  ** Please notify patient to allow 48-72 hours to process**  **Encourage patient to contact the pharmacy for refills or they can [facility]

## 2022-06-28 NOTE — Telephone Encounter (Addendum)
Patient is calling in stating that she wants to check up on this. Sue Green would like a call once it has completed, her concern is that she has been out for the last week.

## 2022-06-29 MED ORDER — CYCLOBENZAPRINE HCL 10 MG PO TABS: 10.0000 mg | ORAL_TABLET | Freq: Every day | ORAL | 5 refills | Status: DC

## 2022-06-29 NOTE — Telephone Encounter (Signed)
Done erx 

## 2022-06-29 NOTE — Telephone Encounter (Signed)
Patient needs a new Rx for cyclobenzaprine, [facility] to [facility] on [address] LOV 05/31/22

## 2022-06-30 ENCOUNTER — Telehealth: Payer: Self-pay | Admitting: Podiatry

## 2022-06-30 NOTE — Telephone Encounter (Signed)
Pt has questions about "biotin" topical solution that was recommended to use on her toenail. She is also having some tingleing on her feet at night and wanted to know if its okay to sleep in compression socks to help with the sensation feeling.  Please advise.

## 2022-07-01 NOTE — Telephone Encounter (Signed)
Attempted to contact the patient, no answer and voicemail was full. Will try again later.

## 2022-07-04 ENCOUNTER — Other Ambulatory Visit: Payer: Self-pay | Admitting: Internal Medicine

## 2022-07-04 NOTE — Telephone Encounter (Signed)
Please refill as per office routine med refill policy (all routine meds to be refilled for 3 mo or monthly (per pt preference) up to one year from last visit, then month to month grace period for 3 mo, then further med refills will have to be denied) ? ?

## 2022-07-19 ENCOUNTER — Ambulatory Visit (INDEPENDENT_AMBULATORY_CARE_PROVIDER_SITE_OTHER): Payer: Medicare Other

## 2022-07-19 DIAGNOSIS — Z0000 Encounter for general adult medical examination without abnormal findings: Secondary | ICD-10-CM | POA: Diagnosis not present

## 2022-07-19 NOTE — Progress Notes (Signed)
Called pt x3 times with no response. VM was full and could not leave a message.

## 2022-07-19 NOTE — Patient Instructions (Addendum)
Please schedule your next Medicare Wellness Visit with your Nurse Health Advisor in 1 year by calling [phone]. 

## 2022-07-19 NOTE — Addendum Note (Signed)
Addended by: Rossie Muskrat on: 07/19/2022 03:57 PM   Modules accepted: Level of Service

## 2022-07-19 NOTE — Progress Notes (Cosign Needed Addendum)
Subjective:   Sue Green is a 76 y.o. female who presents for Medicare Annual (Subsequent) preventive examination.  I connected with Sue Green today by telephone and verified that I am speaking with the correct person using two identifiers. I discussed the limitations, risks, security and privacy concerns of performing an evaluation and management service by telephone and the availability of in person appointments. I also discussed with the patient that there may be a patient responsible charge related to this service. The patient expressed understanding and agreed to proceed. Location patient: home Location provider: [place] Persons participating in the visit: Sue Green and Sue Green, [place].  Time Spent with patient on telephone encounter: 15 minutes   Review of Systems    No ROS. Medicare Wellness Telephone Visit. Additional risk factors are reflected in social history. Cardiac Risk Factors include: advanced age (>78mn, >>62women);sedentary lifestyle;hypertension     Objective:    There were no vitals filed for this visit. There is no height or weight on file to calculate BMI.     07/19/2022    3:47 PM 07/25/2021   11:58 AM 07/23/2021   12:00 AM  Advanced Directives  Does Patient Have a Medical Advance Directive? Yes No No  Type of Advance Directive Living will    Does patient want to make changes to medical advance directive? No - Patient declined    Would patient like information on creating a medical advance directive?  No - Patient declined No - Patient declined    Current Medications (verified) Outpatient Encounter Medications as of 07/19/2022  Medication Sig   acetaminophen (TYLENOL) 325 MG tablet Take 2 tablets (650 mg total) by mouth every 6 (six) hours as needed for mild pain (or Fever >/= 101).   AMBULATORY NON FORMULARY MEDICATION Nitroglycerine ointment 0.125 %  Apply a pea sized amount internally and on lesion three times daily for 6 weeks    Artificial Saliva (ACT DRY MOUTH) LOZG Use as directed 1 lozenge in the mouth or throat every 6 (six) hours as needed (for a dry mouth).   Ascorbic Acid (VITAMIN C) 500 MG tablet Take 500 mg by mouth daily.   cyclobenzaprine (FLEXERIL) 10 MG tablet Take 1 tablet (10 mg total) by mouth daily.   desvenlafaxine (PRISTIQ) 100 MG 24 hr tablet Take 100 mg by mouth daily.   doxycycline (VIBRAMYCIN) 100 MG capsule Take 1 capsule (100 mg total) by mouth 2 (two) times daily.   Ferrous Sulfate (IRON) 28 MG TABS Take 28 mg by mouth daily with breakfast.   hydrocortisone (ANUSOL-HC) 2.5 % rectal cream Place 1 application rectally 2 (two) times daily. For 7-14 days   Krill Oil 300 MG CAPS Take 300 mg by mouth daily.   Methylcellulose, Laxative, (CITRUCEL PO) See admin instructions. Mix 1 tablespoonful of powder into water and drink before breakfast every day   metoprolol succinate (TOPROL-XL) 50 MG 24 hr tablet TAKE ONE AND 1/2 TABS BY MOUTH IN THE MORNING   Misc Natural Products (OSTEO BI-FLEX TRIPLE STRENGTH) TABS Take 1 tablet by mouth in the morning and at bedtime.   montelukast (SINGULAIR) 10 MG tablet Take 1 tablet (10 mg total) by mouth daily.   Multiple Vitamins-Minerals (ONE-A-DAY WOMENS 50+ ADVANTAGE) TABS Take 1 tablet by mouth daily.   mupirocin ointment (BACTROBAN) 2 % 3 (three) times daily.   Neomycin-Bacitracin-Polymyxin (NEOSPORIN ORIGINAL EX) Apply topically.   OCUVITE LUTEIN 25 25-5 MG CAPS Take 1 capsule by mouth daily.   ondansetron (  ZOFRAN-ODT) 4 MG disintegrating tablet Take 1 tablet (4 mg total) by mouth every 8 (eight) hours as needed for nausea or vomiting.   pantoprazole (PROTONIX) 40 MG tablet Take 1 tablet (40 mg total) by mouth daily.   polyethylene glycol powder (GLYCOLAX/MIRALAX) 17 GM/SCOOP powder Take 8.5 g by mouth See admin instructions. Mix 8.5 grams into 4-8 ounces of water and drink by mouth once a day   SYNTHROID 75 MCG tablet TAKE 1 TABLET BY MOUTH EVERY DAY   busPIRone  (BUSPAR) 10 MG tablet Take 10 mg by mouth 2 (two) times daily with a meal. (Patient not taking: Reported on 07/19/2022)   No facility-administered encounter medications on file as of 07/19/2022.    Allergies (verified) Penicillins; Doxycycline; Antihistamines, diphenhydramine-type; Diphenhydramine; Lorazepam; and Latex   History: Past Medical History:  Diagnosis Date   Allergy    Anal fissure    Anemia, iron deficiency 03/06/2014   Anxiety    Arthritis    Asthma    B12 deficiency    Cholelithiasis    Chronic tension headaches    IN PAST   Depression    Duodenal stenosis    Duodenal ulcer    Fibromyalgia    GERD (gastroesophageal reflux disease)    Glaucoma     Per pt, she does not have glaucoma.   Hepatic cyst    Hypertension    Hypothyroidism    Internal hemorrhoids    Iron deficiency anemia    Osteopenia    Pyloric stenosis    Scoliosis    Thyroid disease    hypothyroidism   Vitamin D deficiency    Past Surgical History:  Procedure Laterality Date   APPENDECTOMY  1975   BIOPSY  07/24/2021   Procedure: BIOPSY;  Surgeon: Ladene Artist, MD;  Location: [facility];  Service: Endoscopy;;   ESOPHAGOGASTRODUODENOSCOPY (EGD) WITH PROPOFOL N/A 07/24/2021   Procedure: ESOPHAGOGASTRODUODENOSCOPY (EGD) WITH PROPOFOL;  Surgeon: Ladene Artist, MD;  Location: [facility];  Service: Endoscopy;  Laterality: N/A;   SHOULDER ARTHROSCOPY  2001   rt shoulder   TONSILLECTOMY AND ADENOIDECTOMY     76 years old   Family History  Problem Relation Age of Onset   Diabetes Mother    Arthritis Mother    Cancer Father    Diabetes Father    Colon cancer Neg Hx    Social History   Socioeconomic History   Marital status: Divorced    Spouse name: Not on file   Number of children: Not on file   Years of education: 16   Highest education level: Not on file  Occupational History   Occupation: Retired  Tobacco Use   Smoking status: Never   Smokeless tobacco: Never  Vaping  Use   Vaping Use: Never used  Substance and Sexual Activity   Alcohol use: No    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   Drug use: No   Sexual activity: Not on file  Other Topics Concern   Not on file  Social History Narrative   Regular exercise-no   Caffeine Use-unsure   Social Determinants of Health   Financial Resource Strain: Low Risk  (07/19/2022)   Overall Financial Resource Strain (CARDIA)    Difficulty of Paying Living Expenses: Not hard at all  Food Insecurity: No Food Insecurity (07/19/2022)   Hunger Vital Sign    Worried About Running Out of Food in the Last Year: Never true  Ran Out of Food in the Last Year: Never true  Transportation Needs: No Transportation Needs (07/19/2022)   PRAPARE - Hydrologist (Medical): No    Lack of Transportation (Non-Medical): No  Physical Activity: Inactive (07/19/2022)   Exercise Vital Sign    Days of Exercise per Week: 0 days    Minutes of Exercise per Session: 0 min  Stress: No Stress Concern Present (07/19/2022)   [place]    Feeling of Stress : Not at all  Social Connections: Unknown (07/19/2022)   Social Connection and Isolation Panel [NHANES]    Frequency of Communication with Friends and Family: Three times a week    Frequency of Social Gatherings with Friends and Family: Once a week    Attends Religious Services: 1 to 4 times per year    Active Member of [employer] or Organizations: No    Attends Music therapist: Never    Marital Status: Patient refused    Tobacco Counseling Counseling given: Not Answered   Clinical Intake:  Pre-visit preparation completed: Yes  Pain : No/denies pain        How often do you need to have someone help you when you read instructions, pamphlets, or other written materials from your doctor or pharmacy?: 1 - Never What is the last grade  level you completed in school?: college  Diabetic?no  Interpreter Needed?: No  Information entered by :: Sue Green, [place] of Daily Living    07/19/2022    3:47 PM 07/23/2021   12:00 AM  In your present state of health, do you have any difficulty performing the following activities:  Hearing? 0 0  Vision? 0 0  Difficulty concentrating or making decisions? 0 0  Walking or climbing stairs? 0 1  Dressing or bathing? 0 0  Doing errands, shopping? 0 0  Preparing Food and eating ? N   Using the Toilet? N   In the past six months, have you accidently leaked urine? N   Do you have problems with loss of bowel control? N   Managing your Medications? N   Managing your Finances? N   Housekeeping or managing your Housekeeping? N     Patient Care Team: Biagio Borg, MD as PCP - General (Internal Medicine) Everlene Farrier, MD as Attending Physician (Obstetrics and Gynecology) Norma Fredrickson, MD as Attending Physician (Psychiatry) Marygrace Drought, MD as Attending Physician (Ophthalmology)  Indicate any recent Medical Services you may have received from other than [facility] providers in the past year (date may be approximate).     Assessment:   This is a routine wellness examination for [place].  Hearing/Vision screen Patient denied any hearing difficulty. No hearing aids. Patient does wear corrective lenses.  Dietary issues and exercise activities discussed: Current Exercise Habits: The patient does not participate in regular exercise at present, Exercise limited by: None identified   Goals Addressed             This Visit's Progress    DIET - INCREASE WATER INTAKE       Patient wishes to increase her water intake.       Depression Screen    07/19/2022    3:46 PM 04/28/2022    3:39 PM 01/13/2021    4:35 PM 01/13/2021    4:17 PM 06/05/2020    3:43 PM 04/17/2020    3:21 PM 04/17/2020    2:51 PM  PHQ 2/9 Scores  PHQ - 2 Score 0 0 1 2 0 1 1  PHQ- 9 Score  0  5    1    Fall Risk    07/19/2022    3:47 PM 04/28/2022    3:38 PM 01/13/2021    4:35 PM 01/13/2021    4:17 PM 06/05/2020    3:43 PM  [place] in the past year? 0 0 0 1 0  Number falls in past yr: 0 0  0   Injury with Fall? 0 0  0   Risk for fall due to : No Fall Risks No Fall Risks     Follow up Falls evaluation completed Falls evaluation completed       [place]:  Any stairs in or around the home? No  If so, are there any without handrails?  N/A Home free of loose throw rugs in walkways, pet beds, electrical cords, etc? Yes  Adequate lighting in your home to reduce risk of falls? Yes   ASSISTIVE DEVICES UTILIZED TO PREVENT FALLS:  Life alert? Yes  Use of a cane, walker or w/c? No  Grab bars in the bathroom? No  Shower chair or bench in shower? Yes  Elevated toilet seat or a handicapped toilet? No   TIMED UP AND GO:  Was the test performed? No phone visit.  Length of time to ambulate 10 feet: N/A sec.   Patient stated that she has no issues with gait or balance; does not use any assistive devices.  Cognitive Function:  Patient is cogitatively intact.      07/19/2022    3:50 PM  6CIT Screen  What Year? 0 points  What month? 0 points  What time? 0 points  Count back from 20 0 points  Months in reverse 0 points  Repeat phrase 10 points  Total Score 10 points    Immunizations Immunization History  Administered Date(s) Administered   Fluad Quad(high Dose 65+) 06/05/2020   Influenza Split 07/05/2011   Influenza, High Dose Seasonal PF 05/28/2018, 05/17/2019, 06/26/2021   Influenza, Seasonal, Injecte, Preservative Fre 06/03/2013   Influenza,inj,Quad PF,6+ Mos 07/03/2014, 05/30/2015   Influenza-Unspecified 07/17/2012, 04/30/2016   PFIZER Comirnaty(Gray Top)Covid-19 Tri-Sucrose Vaccine 02/19/2021   PFIZER(Purple Top)SARS-COV-2 Vaccination 11/05/2019, 11/26/2019   Pneumococcal Conjugate-13 07/31/2013   Pneumococcal  Polysaccharide-23 07/17/2012   Td 09/20/2000   Tdap 07/17/2012, 03/25/2017, 06/09/2022    TDAP status: Up to date  Flu Vaccine status: Due, Education has been provided regarding the importance of this vaccine. Advised may receive this vaccine at local pharmacy or Health Dept. Aware to provide a copy of the vaccination record if obtained from local pharmacy or Health Dept. Verbalized acceptance and understanding.  Pneumococcal vaccine status: Up to date  Covid-19 vaccine status: Completed vaccines  Qualifies for Shingles Vaccine? Yes   Zostavax completed No   Shingrix Completed?: No.    Education has been provided regarding the importance of this vaccine. Patient has been advised to call insurance company to determine out of pocket expense if they have not yet received this vaccine. Advised may also receive vaccine at local pharmacy or Health Dept. Verbalized acceptance and understanding.  Screening Tests Health Maintenance  Topic Date Due   Medicare Annual Wellness (AWV)  Never done   INFLUENZA VACCINE  04/20/2022   Zoster Vaccines- Shingrix (1 of 2) 07/29/2022 (Originally 11/06/1964)   TETANUS/TDAP  06/09/2032   Pneumonia Vaccine 13+ Years old  Completed   DEXA SCAN  Completed   Hepatitis C Screening  Completed   HPV VACCINES  Aged Out   COLONOSCOPY (Pts 45-56yr Insurance coverage will need to be confirmed)  Discontinued   COVID-19 Vaccine  Discontinued    Health Maintenance  Health Maintenance Due  Topic Date Due   Medicare Annual Wellness (AWV)  Never done   INFLUENZA VACCINE  04/20/2022    Colorectal cancer screening: Type of screening: Colonoscopy. Completed 11/25/2021. Repeat every N/A years  Mammogram status: No longer required due to age.  Bone Density status: Completed 09/21/2015  Lung Cancer Screening: (Low Dose CT Chest recommended if Age 76-80years, 30 pack-year currently smoking OR have quit w/in 15years.) does not qualify.   Lung Cancer Screening Referral:  N/A  Additional Screening:  Hepatitis C Screening: does qualify; Completed 09/10/2015  Vision Screening: Recommended annual ophthalmology exams for early detection of glaucoma and other disorders of the eye. Is the patient up to date with their annual eye exam?  Yes  Who is the provider or what is the name of the office [facility] which the patient [facility]ends annual [facility] exams[facility]  If pt is not established with a provider, would they like to be referred to a provider to establish care? No .   Dental Screening: Recommended annual dental exams for proper oral hygiene  Community Resource Referral / Chronic Care Management: CRR required this visit?  No   CCM required this visit?  No      Plan:     I have personally reviewed and noted the following in the patient's chart:   Medical and social history Use of alcohol, tobacco or illicit drugs  Current medications and supplements including opioid prescriptions. Patient is not currently taking opioid prescriptions. Functional ability and status Nutritional status Physical activity Advanced directives List of other physicians Hospitalizations, surgeries, and ER visits in previous 12 months Vitals Screenings to include cognitive, depression, and falls Referrals and appointments  In addition, I have reviewed and discussed with patient certain preventive protocols, quality metrics, and best practice recommendations. A written personalized care plan for preventive services as well as general preventive health recommendations were provided to patient.     HRossie Muskrat CHumphreys  07/19/2022   Nurse Notes: N/A

## 2022-09-15 ENCOUNTER — Ambulatory Visit (INDEPENDENT_AMBULATORY_CARE_PROVIDER_SITE_OTHER): Payer: Medicare Other | Admitting: Podiatry

## 2022-09-15 ENCOUNTER — Ambulatory Visit: Payer: Medicare Other | Admitting: Podiatry

## 2022-09-15 ENCOUNTER — Encounter: Payer: Self-pay | Admitting: Podiatry

## 2022-09-15 DIAGNOSIS — M79674 Pain in right toe(s): Secondary | ICD-10-CM

## 2022-09-15 DIAGNOSIS — M79675 Pain in left toe(s): Secondary | ICD-10-CM

## 2022-09-15 DIAGNOSIS — B351 Tinea unguium: Secondary | ICD-10-CM

## 2022-09-15 NOTE — Progress Notes (Signed)
This patient presents to the office saying she injured her outside nail border right big toe.  Her nail has become thickened and is painful.  She saw Dr.  Jacqualyn Posey before for this condition.  She says the nail needs to be treated.    Vascular  Dorsalis pedis and posterior tibial pulses are palpable  B/L.  Capillary return  WNL.  Temperature gradient is  WNL.  Skin turgor  WNL  Sensorium  Senn Weinstein monofilament wire  WNL. Normal tactile sensation.  Nail Exam  Patient has thick hallux nails with no evidence of bacterial or fungal infection.  She has callus which is present along the lateral nail groove  Orthopedic  Exam  Muscle tone and muscle strength  WNL.  No limitations of motion feet  B/L.  No crepitus or joint effusion noted.  Foot type is unremarkable and digits show no abnormalities.  Bony prominences are unremarkable.  Skin  No open lesions.  Normal skin texture and turgor.   Onychomycosis  ROV.  Debride nails 1-5  B/L.  She has callus along the lateral border removed with nail nipper.  RTC 3 months.   Gardiner Barefoot DPM   Addendum  Patient was in continual discomfort cutting her nails .  She requested the nails be cut shorter.  She says her feet may be neuropathic since she had strange continual pain and all I did was touch her nails.

## 2022-10-02 ENCOUNTER — Other Ambulatory Visit: Payer: Self-pay | Admitting: Internal Medicine

## 2022-10-02 NOTE — Telephone Encounter (Signed)
Please refill as per office routine med refill policy (all routine meds to be refilled for 3 mo or monthly (per pt preference) up to one year from last visit, then month to month grace period for 3 mo, then further med refills will have to be denied)

## 2022-11-01 ENCOUNTER — Other Ambulatory Visit: Payer: Self-pay | Admitting: Gastroenterology

## 2022-11-03 ENCOUNTER — Ambulatory Visit: Payer: Medicare Other | Admitting: Internal Medicine

## 2022-11-03 ENCOUNTER — Ambulatory Visit: Payer: Medicare Other | Admitting: Family Medicine

## 2022-11-06 IMAGING — CT CT ABD-PELV W/ CM
2 of 5 series · 15 of 46 positions shown, 17 images · IV contrast (omnipaque)
Comparison: 08/12/2010

CLINICAL DATA: Pain upper abdomen

EXAM:
CT ABDOMEN AND PELVIS WITH CONTRAST
TECHNIQUE: Multidetector CT imaging of the abdomen and pelvis was performed
using the standard protocol following bolus administration of
intravenous contrast.
CONTRAST:  80mL OMNIPAQUE IOHEXOL 350 MG/ML SOLN

[Series 2: axial st · axial · 0.74mm/px · z∈[-338,+2]mm · 12 of 82 slices shown, 14 images]
[im 7/82  soft-tissue]
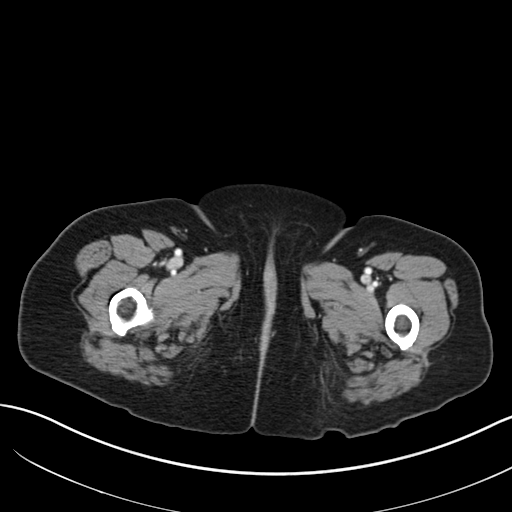
[im 7/82  bone]
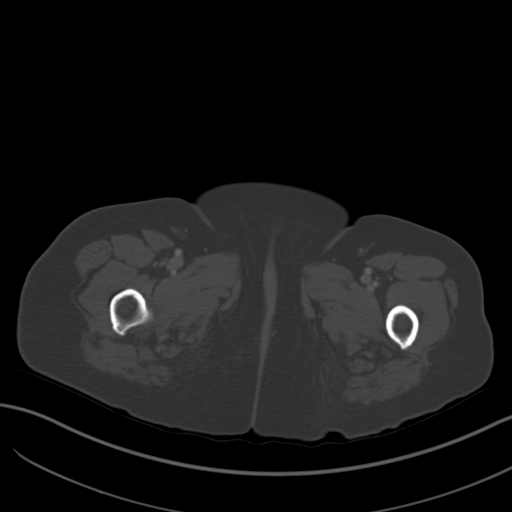
[im 13/82  soft-tissue]
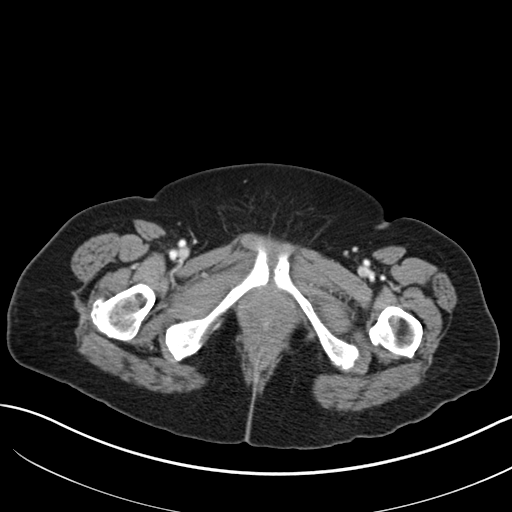
[im 19/82  soft-tissue]
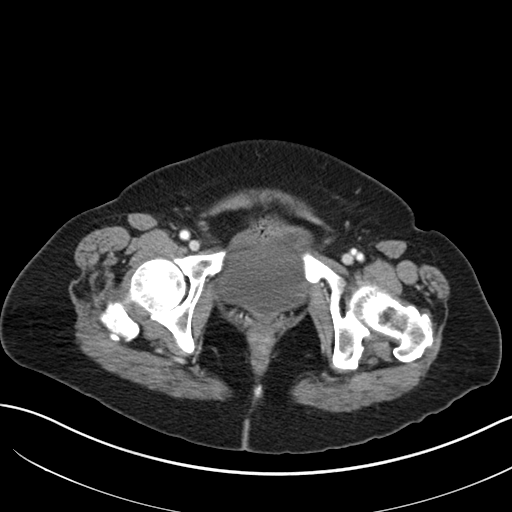
[im 25/82  soft-tissue]
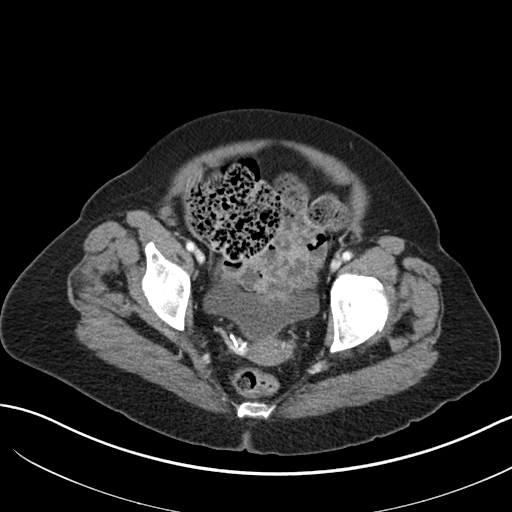
[im 32/82  soft-tissue]
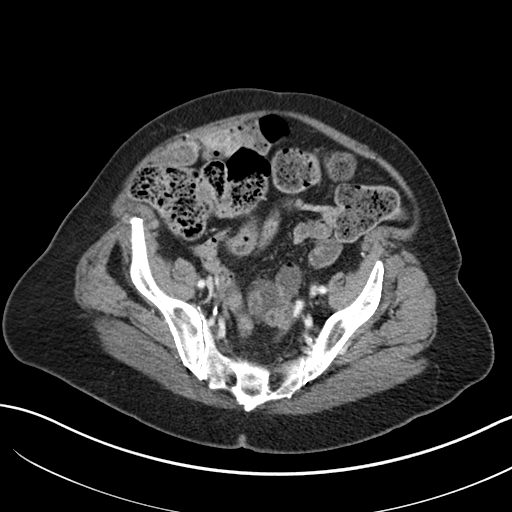
[im 38/82  soft-tissue]
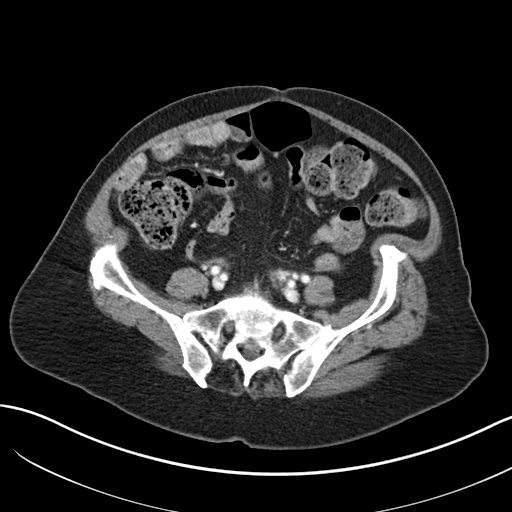
[im 44/82  soft-tissue]
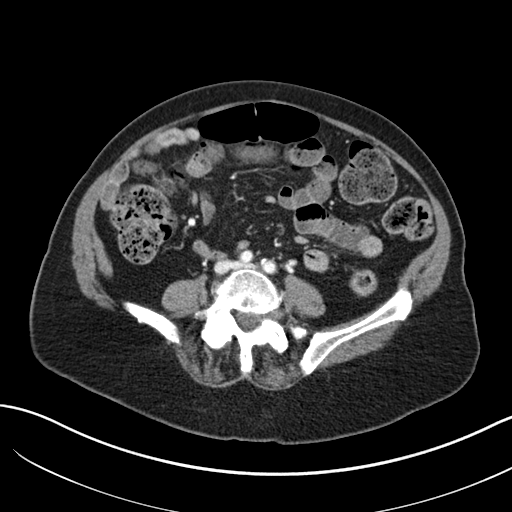
[im 50/82  soft-tissue]
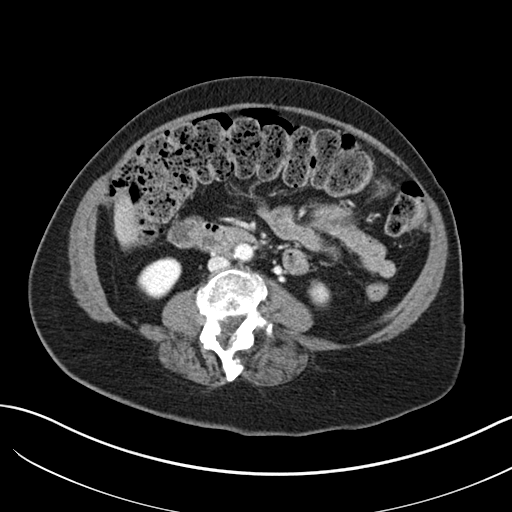
[im 57/82  soft-tissue]
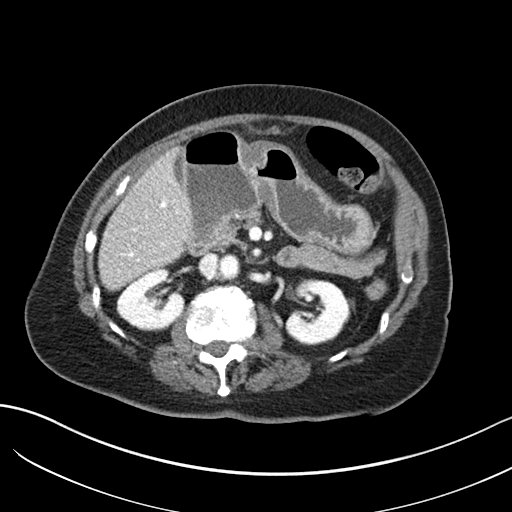
[im 57/82  bone]
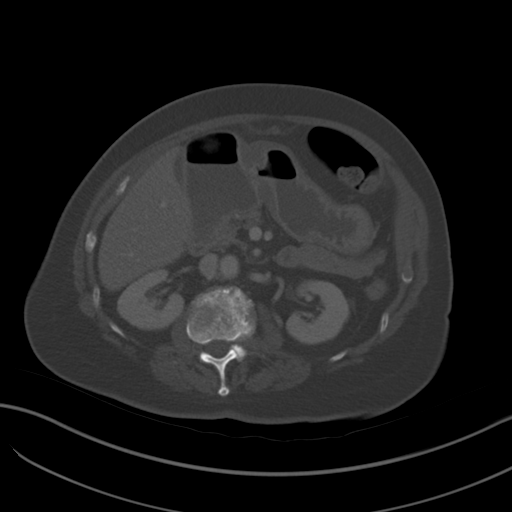
[im 63/82  soft-tissue]
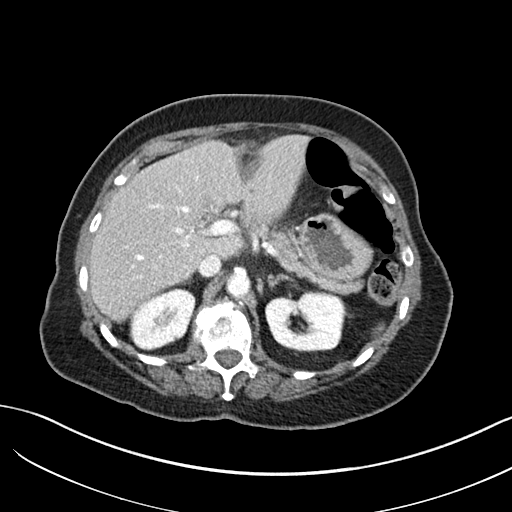
[im 69/82  soft-tissue]
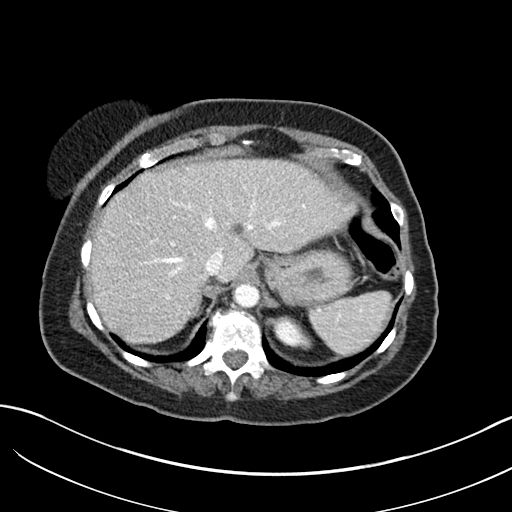
[im 75/82  soft-tissue]
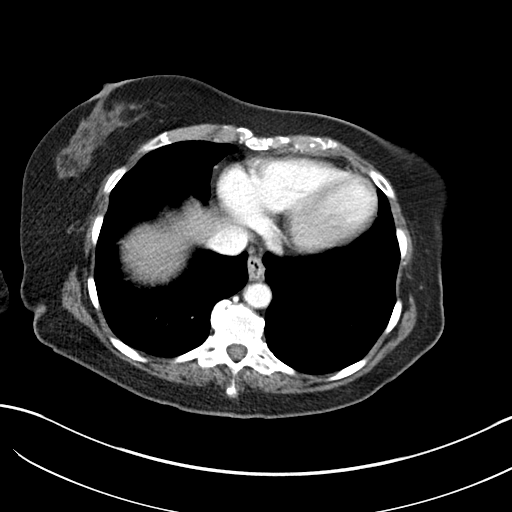

[Series 4: coronal st · coronal · 0.76mm/px · 3 of 132 slices shown]
[im 44/132  soft-tissue]
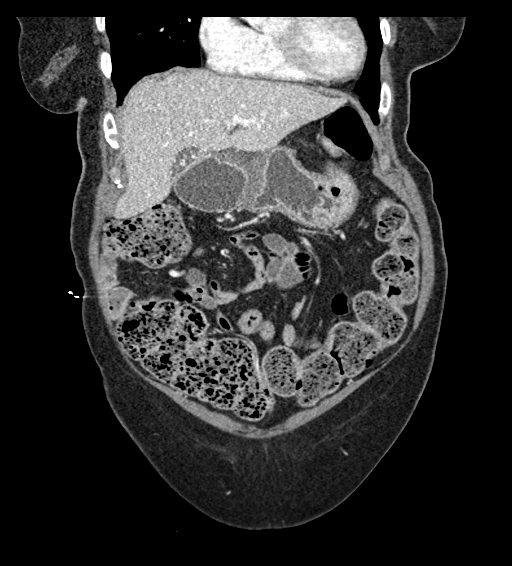
[im 59/132  soft-tissue]
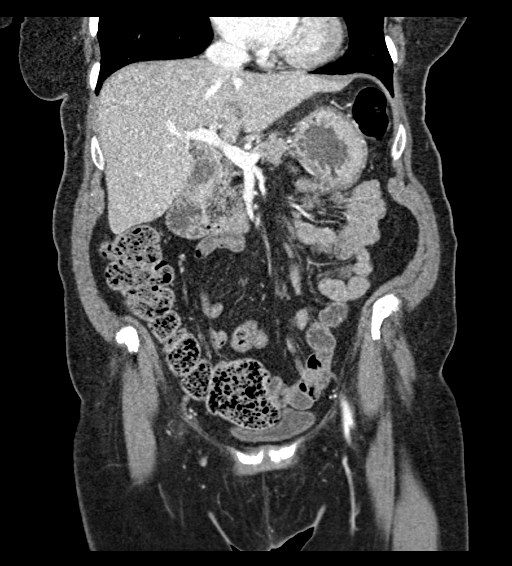
[im 73/132  soft-tissue]
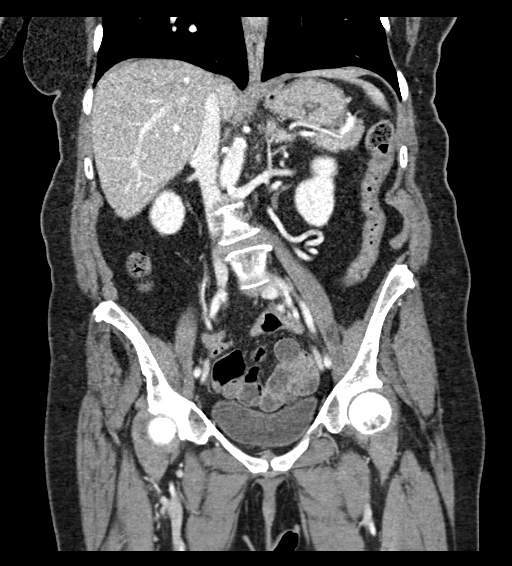

[15 of 46 positions shown; findings below may reference images not displayed]

FINDINGS: Lower chest: Unremarkable

Hepatobiliary: Liver measures 13.2 cm. There is fatty infiltration.
In image 11 of series 2, there is 6 mm low-density in the left lobe,
possibly a cyst. In image 14, there is 9 mm possible cyst in the
liver. There are multiple gallbladder stones. There is no dilation
of bile ducts.

Pancreas: No focal abnormality is seen

Spleen: Unremarkable.

Adrenals/Urinary Tract: Adrenals are not enlarged. There is no
hydronephrosis. There are no renal or ureteral stones. Urinary
bladder is unremarkable.

Stomach/Bowel: Stomach is unremarkable. Small bowel loops are not
dilated. Appendix is not seen. There is no pericecal inflammation.
Moderate to large amount of stool is seen in ascending, transverse
and descending colon. There is no wall thickening or pericolic
stranding.

Vascular/Lymphatic: Atherosclerotic plaques and calcifications are
seen in aorta and its major branches.

Reproductive: Unremarkable

Other: There is no ascites or pneumoperitoneum

Musculoskeletal: Degenerative changes are noted in lower thoracic
spine and lumbar spine. Dextroscoliosis is seen in the lumbar spine.
IMPRESSION: There is no evidence intestinal obstruction or pneumoperitoneum.
There is no hydronephrosis. Gallbladder stones. There is no dilation
of bile ducts.

Other findings as described in the body of the report.

## 2022-11-17 ENCOUNTER — Ambulatory Visit: Payer: Medicare Other | Admitting: Family Medicine

## 2022-11-25 ENCOUNTER — Other Ambulatory Visit: Payer: Self-pay | Admitting: Internal Medicine

## 2022-12-11 ENCOUNTER — Other Ambulatory Visit: Payer: Self-pay | Admitting: Internal Medicine

## 2022-12-13 ENCOUNTER — Other Ambulatory Visit: Payer: Self-pay | Admitting: Internal Medicine

## 2022-12-27 ENCOUNTER — Ambulatory Visit: Payer: Medicare Other | Admitting: Podiatry

## 2022-12-29 ENCOUNTER — Encounter: Payer: Self-pay | Admitting: Podiatry

## 2022-12-29 ENCOUNTER — Ambulatory Visit (INDEPENDENT_AMBULATORY_CARE_PROVIDER_SITE_OTHER): Payer: Medicare Other | Admitting: Podiatry

## 2022-12-29 DIAGNOSIS — B351 Tinea unguium: Secondary | ICD-10-CM

## 2022-12-29 DIAGNOSIS — M79675 Pain in left toe(s): Secondary | ICD-10-CM

## 2022-12-29 DIAGNOSIS — M79674 Pain in right toe(s): Secondary | ICD-10-CM

## 2022-12-29 NOTE — Progress Notes (Signed)
This patient presents to the office saying she injured her outside nail border right big toe.  Her nail has become thickened and is painful.    She says the nail needs to be treated.    Vascular  Dorsalis pedis and posterior tibial pulses are palpable  B/L.  Capillary return  WNL.  Temperature gradient is  WNL.  Skin turgor  WNL  Sensorium  Senn Weinstein monofilament wire  WNL. Normal tactile sensation.  Nail Exam  Patient has thick hallux nails with no evidence of bacterial or fungal infection.    Orthopedic  Exam  Muscle tone and muscle strength  WNL.  No limitations of motion feet  B/L.  No crepitus or joint effusion noted.  Foot type is unremarkable and digits show no abnormalities.  Bony prominences are unremarkable.  Skin  No open lesions.  Normal skin texture and turgor.   Onychomycosis    Debride nails 1-5  B/L.  Marland Kitchen  RTC 3 months.   Helane Gunther DPM   Addendum  Patient was in continual discomfort cutting her nails .    She says her feet may be neuropathic since she had strange continual pain .  She was very concerned about the reoccurence of her nail pain lateral border right hallux.  Padding was applied to her shoes to provide arch padding.  She also was fixated on the medicine that Dr.  Ardelle Anton prescribed for her feet.  I told her it does come in solution form and she kept asking where to buy this medicine.  I do not prescribe this medicine so I told her to ask the pharmacist to order the medicine.  Finally D. Trimmed her nail with dremel tool .  Helane Gunther DPM

## 2023-01-11 ENCOUNTER — Other Ambulatory Visit: Payer: Self-pay | Admitting: Internal Medicine

## 2023-01-24 NOTE — Telephone Encounter (Signed)
Patient called back and needs her medication - she is down to her last pill

## 2023-01-25 ENCOUNTER — Other Ambulatory Visit: Payer: Self-pay

## 2023-02-04 ENCOUNTER — Ambulatory Visit: Payer: Medicare Other | Admitting: Internal Medicine

## 2023-02-07 ENCOUNTER — Ambulatory Visit: Payer: Medicare Other | Admitting: Internal Medicine

## 2023-02-09 ENCOUNTER — Encounter: Payer: Self-pay | Admitting: Internal Medicine

## 2023-02-09 ENCOUNTER — Ambulatory Visit (INDEPENDENT_AMBULATORY_CARE_PROVIDER_SITE_OTHER): Payer: Medicare Other | Admitting: Internal Medicine

## 2023-02-09 VITALS — BP 122/76 | HR 90 | Temp 98.3°F | Ht 59.0 in | Wt 128.0 lb

## 2023-02-09 DIAGNOSIS — E7800 Pure hypercholesterolemia, unspecified: Secondary | ICD-10-CM

## 2023-02-09 DIAGNOSIS — R739 Hyperglycemia, unspecified: Secondary | ICD-10-CM

## 2023-02-09 DIAGNOSIS — E538 Deficiency of other specified B group vitamins: Secondary | ICD-10-CM

## 2023-02-09 DIAGNOSIS — E559 Vitamin D deficiency, unspecified: Secondary | ICD-10-CM | POA: Diagnosis not present

## 2023-02-09 DIAGNOSIS — Z0001 Encounter for general adult medical examination with abnormal findings: Secondary | ICD-10-CM | POA: Diagnosis not present

## 2023-02-09 DIAGNOSIS — D509 Iron deficiency anemia, unspecified: Secondary | ICD-10-CM

## 2023-02-09 DIAGNOSIS — E039 Hypothyroidism, unspecified: Secondary | ICD-10-CM

## 2023-02-09 DIAGNOSIS — I10 Essential (primary) hypertension: Secondary | ICD-10-CM

## 2023-02-09 LAB — BASIC METABOLIC PANEL
BUN: 14 mg/dL (ref 6–23)
CO2: 30 mEq/L (ref 19–32)
Calcium: 9.8 mg/dL (ref 8.4–10.5)
Chloride: 98 mEq/L (ref 96–112)
Creatinine, Ser: 0.68 mg/dL (ref 0.40–1.20)
GFR: 84.1 mL/min (ref >60.00)
Glucose, Bld: 104 mg/dL — ABNORMAL HIGH (ref 70–99)
Potassium: 4.2 mEq/L (ref 3.5–5.1)
Sodium: 136 mEq/L (ref 135–145)

## 2023-02-09 LAB — CBC WITH DIFFERENTIAL/PLATELET
Basophils Absolute: 0 10*3/uL (ref 0.0–0.1)
Basophils Relative: 0.4 % (ref 0.0–3.0)
Eosinophils Absolute: 0.1 10*3/uL (ref 0.0–0.7)
Eosinophils Relative: 1.5 % (ref 0.0–5.0)
HCT: 39.3 % (ref 36.0–46.0)
Hemoglobin: 13.1 g/dL (ref 12.0–15.0)
Lymphocytes Relative: 12.9 % (ref 12.0–46.0)
Lymphs Abs: 1 10*3/uL (ref 0.7–4.0)
MCHC: 33.4 g/dL (ref 30.0–36.0)
MCV: 99.1 fl (ref 78.0–100.0)
Monocytes Absolute: 0.9 10*3/uL (ref 0.1–1.0)
Monocytes Relative: 10.8 % (ref 3.0–12.0)
Neutro Abs: 5.9 10*3/uL (ref 1.4–7.7)
Neutrophils Relative %: 74.4 % (ref 43.0–77.0)
Platelets: 305 10*3/uL (ref 150.0–400.0)
RBC: 3.96 Mil/uL (ref 3.87–5.11)
RDW: 13 % (ref 11.5–15.5)
WBC: 8 10*3/uL (ref 4.0–10.5)

## 2023-02-09 LAB — LIPID PANEL
Cholesterol: 161 mg/dL (ref 0–200)
HDL: 40 mg/dL (ref >39.00)
NonHDL: 121.04
Total CHOL/HDL Ratio: 4
Triglycerides: 357 mg/dL — ABNORMAL HIGH (ref 0.0–149.0)
VLDL: 71.4 mg/dL — ABNORMAL HIGH (ref 0.0–40.0)

## 2023-02-09 LAB — HEMOGLOBIN A1C: Hgb A1c MFr Bld: 5.9 % (ref 4.6–6.5)

## 2023-02-09 LAB — HEPATIC FUNCTION PANEL
ALT: 17 U/L (ref 0–35)
AST: 20 U/L (ref 0–37)
Albumin: 4.4 g/dL (ref 3.5–5.2)
Alkaline Phosphatase: 80 U/L (ref 39–117)
Bilirubin, Direct: 0.1 mg/dL (ref 0.0–0.3)
Total Bilirubin: 0.3 mg/dL (ref 0.2–1.2)
Total Protein: 7.3 g/dL (ref 6.0–8.3)

## 2023-02-09 LAB — MICROALBUMIN / CREATININE URINE RATIO
Creatinine,U: 48.5 mg/dL
Microalb Creat Ratio: 1.4 mg/g (ref 0.0–30.0)
Microalb, Ur: <0.7 mg/dL (ref 0.0–1.9)

## 2023-02-09 LAB — TSH: TSH: 0.51 u[IU]/mL (ref 0.35–5.50)

## 2023-02-09 LAB — LDL CHOLESTEROL, DIRECT: Direct LDL: 96 mg/dL

## 2023-02-09 LAB — VITAMIN B12: Vitamin B-12: 1343 pg/mL — ABNORMAL HIGH (ref 211–911)

## 2023-02-09 LAB — VITAMIN D 25 HYDROXY (VIT D DEFICIENCY, FRACTURES): VITD: 75.82 ng/mL (ref 30.00–100.00)

## 2023-02-09 NOTE — Patient Instructions (Signed)

## 2023-02-09 NOTE — Progress Notes (Signed)
Patient ID: Sue Green, female   DOB: Aug 02, 1946, 77 y.o.   MRN: [id number]         Chief Complaint:: wellness exam and htn, low thryoid, anemia, hld, hyperglycemia       HPI:  Sue Green is a 77 y.o. female here for wellness exam; for shingrix at pharmacy, o/w up to date                        Also Pt denies chest pain, increased sob or doe, wheezing, orthopnea, PND, increased LE swelling, palpitations, dizziness or syncope.   Pt denies polydipsia, polyuria, or new focal neuro s/s.   Pt denies fever, wt loss, night sweats, loss of appetite, or other constitutional symptoms  Denies worsening depressive symptoms, suicidal ideation, or panic; has ongoing anxiety  Denies hyper or hypo thyroid symptoms such as voice, skin or hair change.  No overt bleeding.     Wt Readings from Last 3 Encounters:  02/09/23 128 lb (58.1 kg)  04/28/22 121 lb 9.6 oz (55.2 kg)  12/16/21 120 lb 6.4 oz (54.6 kg)   BP Readings from Last 3 Encounters:  02/09/23 122/76  06/09/22 (!) 143/60  04/28/22 136/60   Immunization History  Administered Date(s) Administered   Fluad Quad(high Dose 65+) 06/05/2020, 07/23/2022   Influenza Split 07/05/2011   Influenza, High Dose Seasonal PF 05/28/2018, 05/17/2019, 06/26/2021   Influenza, Seasonal, Injecte, Preservative Fre 06/03/2013   Influenza,inj,Quad PF,6+ Mos 07/03/2014, 05/30/2015   Influenza-Unspecified 07/17/2012, 04/30/2016   PFIZER Comirnaty(Gray Top)Covid-19 Tri-Sucrose Vaccine 02/19/2021   PFIZER(Purple Top)SARS-COV-2 Vaccination 11/05/2019, 11/26/2019   Pneumococcal Conjugate-13 07/31/2013   Pneumococcal Polysaccharide-23 07/17/2012   Td 09/20/2000   Tdap 07/17/2012, 03/25/2017, 06/09/2022   Health Maintenance Due  Topic Date Due   Zoster Vaccines- Shingrix (1 of 2) Never done      Past Medical History:  Diagnosis Date   Allergy    Anal fissure    Anemia, iron deficiency 03/06/2014   Anxiety    Arthritis    Asthma    B12 deficiency     Cholelithiasis    Chronic tension headaches    IN PAST   Depression    Duodenal stenosis    Duodenal ulcer    Fibromyalgia    GERD (gastroesophageal reflux disease)    Glaucoma     Per pt, she does not have glaucoma.   Hepatic cyst    Hypertension    Hypothyroidism    Internal hemorrhoids    Iron deficiency anemia    Osteopenia    Pyloric stenosis    Scoliosis    Thyroid disease    hypothyroidism   Vitamin D deficiency    Past Surgical History:  Procedure Laterality Date   APPENDECTOMY  1975   BIOPSY  07/24/2021   Procedure: BIOPSY;  Surgeon: Meryl Dare, MD;  Location: [facility];  Service: Endoscopy;;   ESOPHAGOGASTRODUODENOSCOPY (EGD) WITH PROPOFOL N/A 07/24/2021   Procedure: ESOPHAGOGASTRODUODENOSCOPY (EGD) WITH PROPOFOL;  Surgeon: Meryl Dare, MD;  Location: [facility];  Service: Endoscopy;  Laterality: N/A;   SHOULDER ARTHROSCOPY  2001   rt shoulder   TONSILLECTOMY AND ADENOIDECTOMY     77 years old    reports that she has never smoked. She has never used smokeless tobacco. She reports that she does not drink alcohol and does not use drugs. family history includes Arthritis in her mother; Cancer in her father; Diabetes in her father and mother. Allergies  Allergen Reactions   Penicillins Hives   Doxycycline Nausea Only   Antihistamines, Diphenhydramine-Type Other (See Comments)    Reaction not recalled   Diphenhydramine Other (See Comments)    Reaction not recalled   Lorazepam Other (See Comments)    Caused trembling in the arms, per the patient   Latex Rash and Other (See Comments)    Patient disputes this in 2022   Current Outpatient Medications on File Prior to Visit  Medication Sig Dispense Refill   acetaminophen (TYLENOL) 325 MG tablet Take 2 tablets (650 mg total) by mouth every 6 (six) hours as needed for mild pain (or Fever >/= 101).     AMBULATORY NON FORMULARY MEDICATION Nitroglycerine ointment 0.125 %  Apply a pea sized amount  internally and on lesion three times daily for 6 weeks 30 g 0   Artificial Saliva (ACT DRY MOUTH) LOZG Use as directed 1 lozenge in the mouth or throat every 6 (six) hours as needed (for a dry mouth).     Ascorbic Acid (VITAMIN C) 500 MG tablet Take 500 mg by mouth daily.     cholecalciferol (VITAMIN D3) 25 MCG (1000 UNIT) tablet Take 1,000 Units by mouth daily.     clobetasol cream (TEMOVATE) 0.05 % SMARTSIG:1 Topical Daily     cyclobenzaprine (FLEXERIL) 10 MG tablet TAKE 1 TABLET BY MOUTH EVERY DAY 30 tablet 0   Ferrous Sulfate (IRON) 28 MG TABS Take 28 mg by mouth daily with breakfast.     HYDROcodone-acetaminophen (NORCO/VICODIN) 5-325 MG tablet TAKE ONE EVERY 4-6 HOURS AS NEEDED FOR PAIN     hydrocortisone (ANUSOL-HC) 2.5 % rectal cream Place 1 application rectally 2 (two) times daily. For 7-14 days 30 g 0   Krill Oil 300 MG CAPS Take 300 mg by mouth daily.     Methylcellulose, Laxative, (CITRUCEL PO) See admin instructions. Mix 1 tablespoonful of powder into water and drink before breakfast every day     metoprolol succinate (TOPROL-XL) 50 MG 24 hr tablet TAKE ONE AND 1/2 TABS BY MOUTH IN THE MORNING 135 tablet 3   Misc Natural Products (OSTEO BI-FLEX TRIPLE STRENGTH) TABS Take 1 tablet by mouth in the morning and at bedtime.     montelukast (SINGULAIR) 10 MG tablet Take 1 tablet (10 mg total) by mouth daily. 90 tablet 3   Multiple Vitamins-Minerals (ONE-A-DAY WOMENS 50+ ADVANTAGE) TABS Take 1 tablet by mouth daily.     mupirocin ointment (BACTROBAN) 2 % 3 (three) times daily.     Neomycin-Bacitracin-Polymyxin (NEOSPORIN ORIGINAL EX) Apply topically.     OCUVITE LUTEIN 25 25-5 MG CAPS Take 1 capsule by mouth daily.     ondansetron (ZOFRAN-ODT) 4 MG disintegrating tablet Take 1 tablet (4 mg total) by mouth every 8 (eight) hours as needed for nausea or vomiting. 15 tablet 0   pantoprazole (PROTONIX) 40 MG tablet TAKE 1 TABLET BY MOUTH TWICE A DAY 180 tablet 0   polyethylene glycol powder  (GLYCOLAX/MIRALAX) 17 GM/SCOOP powder Take 8.5 g by mouth See admin instructions. Mix 8.5 grams into 4-8 ounces of water and drink by mouth once a day     SYNTHROID 75 MCG tablet TAKE 1 TABLET BY MOUTH EVERY DAY 90 tablet 0   vitamin B-12 (CYANOCOBALAMIN) 100 MCG tablet Take 100 mcg by mouth daily.     busPIRone (BUSPAR) 10 MG tablet Take 10 mg by mouth 2 (two) times daily with a meal. (Patient not taking: Reported on 07/19/2022)     desvenlafaxine (  PRISTIQ) 100 MG 24 hr tablet Take 100 mg by mouth daily. (Patient not taking: Reported on 02/09/2023)     doxycycline (VIBRAMYCIN) 100 MG capsule Take 1 capsule (100 mg total) by mouth 2 (two) times daily. (Patient not taking: Reported on 02/09/2023) 14 capsule 0   No current facility-administered medications on file prior to visit.        ROS:  All others reviewed and negative.  Objective        PE:  BP 122/76 (BP Location: Right Arm, Patient Position: Sitting, Cuff Size: Normal)   Pulse 90   Temp 98.3 F (36.8 C) (Oral)   Ht 4\' 11"  (1.499 m)   Wt 128 lb (58.1 kg)   SpO2 98%   BMI 25.85 kg/m                 Constitutional: Pt appears in NAD               HENT: Head: NCAT.                Right Ear: External ear normal.                 Left Ear: External ear normal.                Eyes: . Pupils are equal, round, and reactive to light. Conjunctivae and EOM are normal               Nose: without d/c or deformity               Neck: Neck supple. Gross normal ROM               Cardiovascular: Normal rate and regular rhythm.                 Pulmonary/Chest: Effort normal and breath sounds without rales or wheezing.                Abd:  Soft, NT, ND, + BS, no organomegaly               Neurological: Pt is alert. At baseline orientation, motor grossly intact               Skin: Skin is warm. No rashes, no other new lesions, LE edema - none               Psychiatric: Pt behavior is normal without agitation   Micro: none  Cardiac tracings I  have personally interpreted today:  none  Pertinent Radiological findings (summarize): none   Lab Results  Component Value Date   WBC 8.0 02/09/2023   HGB 13.1 02/09/2023   HCT 39.3 02/09/2023   PLT 305.0 02/09/2023   GLUCOSE 104 (H) 02/09/2023   CHOL 161 02/09/2023   TRIG 357.0 (H) 02/09/2023   HDL 40.00 02/09/2023   LDLDIRECT 96.0 02/09/2023   LDLCALC 78 01/15/2021   ALT 17 02/09/2023   AST 20 02/09/2023   NA 136 02/09/2023   K 4.2 02/09/2023   CL 98 02/09/2023   CREATININE 0.68 02/09/2023   BUN 14 02/09/2023   CO2 30 02/09/2023   TSH 0.51 02/09/2023   HGBA1C 5.9 02/09/2023   MICROALBUR <0.7 02/09/2023   Assessment/Plan:  Sue Green is a 77 y.o. White or Caucasian [1] female with  has a past medical history of Allergy, Anal fissure, Anemia, iron deficiency (03/06/2014), Anxiety, Arthritis, Asthma, B12 deficiency, Cholelithiasis, Chronic tension headaches, Depression, Duodenal  stenosis, Duodenal ulcer, Fibromyalgia, GERD (gastroesophageal reflux disease), Glaucoma, Hepatic cyst, Hypertension, Hypothyroidism, Internal hemorrhoids, Iron deficiency anemia, Osteopenia, Pyloric stenosis, Scoliosis, Thyroid disease, and Vitamin D deficiency.  Encounter for well adult exam with abnormal findings Age and sex appropriate education and counseling updated with regular exercise and diet Referrals for preventative services - none needed Immunizations addressed - for shingrix at pharmacy Smoking counseling  - none needed Evidence for depression or other mood disorder - none significant Most recent labs reviewed. I have personally reviewed and have noted: 1) the patient's medical and social history 2) The patient's current medications and supplements 3) The patient's height, weight, and BMI have been recorded in the chart   Essential hypertension BP Readings from Last 3 Encounters:  02/09/23 122/76  06/09/22 (!) 143/60  04/28/22 136/60   Stable, pt to continue medical  treatment toprol xl 50 mg qd   Hypothyroidism Lab Results  Component Value Date   TSH 0.51 02/09/2023   Stable, pt to continue levothyroxine 75 mcg qd   Anemia, iron deficiency Lab Results  Component Value Date   WBC 8.0 02/09/2023   HGB 13.1 02/09/2023   HCT 39.3 02/09/2023   MCV 99.1 02/09/2023   PLT 305.0 02/09/2023   Stable, cont current med tx  HLD (hyperlipidemia) Lab Results  Component Value Date   LDLCALC 78 01/15/2021   Uncontrolled, goal ldl < 70 with hx of carotid stenosis, pt for lower chol diet, declines statin for now   Hyperglycemia Lab Results  Component Value Date   HGBA1C 5.9 02/09/2023   Stable, pt to continue current medical treatment  - diet, wt control  Followup: Return in about 6 months (around 08/12/2023).  Oliver Barre, MD 02/12/2023 11:59 AM [facility]  Primary Care - [facility]

## 2023-02-09 NOTE — Telephone Encounter (Signed)
Patient has requested a change in providers, asked if you would be willing to take her on.

## 2023-02-10 ENCOUNTER — Encounter: Payer: Self-pay | Admitting: Internal Medicine

## 2023-02-10 LAB — URINALYSIS, ROUTINE W REFLEX MICROSCOPIC
Bilirubin Urine: NEGATIVE
Hgb urine dipstick: NEGATIVE
Ketones, ur: NEGATIVE
Leukocytes,Ua: NEGATIVE
Nitrite: NEGATIVE
RBC / HPF: NONE SEEN (ref 0–?)
Specific Gravity, Urine: <=1.005 — AB (ref 1.000–1.030)
Total Protein, Urine: NEGATIVE
Urine Glucose: NEGATIVE
Urobilinogen, UA: 0.2 (ref 0.0–1.0)
WBC, UA: NONE SEEN (ref 0–?)
pH: 6 (ref 5.0–8.0)

## 2023-02-12 ENCOUNTER — Encounter: Payer: Self-pay | Admitting: Internal Medicine

## 2023-02-12 NOTE — Assessment & Plan Note (Signed)

## 2023-02-12 NOTE — Assessment & Plan Note (Signed)
Lab Results  Component Value Date   WBC 8.0 02/09/2023   HGB 13.1 02/09/2023   HCT 39.3 02/09/2023   MCV 99.1 02/09/2023   PLT 305.0 02/09/2023   Stable, cont current med tx

## 2023-02-12 NOTE — Assessment & Plan Note (Signed)
BP Readings from Last 3 Encounters:  02/09/23 122/76  06/09/22 (!) 143/60  04/28/22 136/60   Stable, pt to continue medical treatment toprol xl 50 mg qd

## 2023-02-12 NOTE — Assessment & Plan Note (Signed)
Lab Results  Component Value Date   HGBA1C 5.9 02/09/2023   Stable, pt to continue current medical treatment  - diet, wt control

## 2023-02-12 NOTE — Assessment & Plan Note (Signed)
Lab Results  Component Value Date   LDLCALC 78 01/15/2021   Uncontrolled, goal ldl < 70 with hx of carotid stenosis, pt for lower chol diet, declines statin for now

## 2023-02-12 NOTE — Assessment & Plan Note (Signed)
Lab Results  Component Value Date   TSH 0.51 02/09/2023   Stable, pt to continue levothyroxine 75 mcg qd

## 2023-02-20 ENCOUNTER — Other Ambulatory Visit: Payer: Self-pay | Admitting: Internal Medicine

## 2023-02-22 ENCOUNTER — Other Ambulatory Visit: Payer: Self-pay | Admitting: Internal Medicine

## 2023-02-28 ENCOUNTER — Telehealth: Payer: Self-pay | Admitting: Gastroenterology

## 2023-02-28 NOTE — Telephone Encounter (Signed)
Patient called would like to know if she still needs to take Pantoprazole medication.

## 2023-03-02 NOTE — Telephone Encounter (Signed)
Patient is calling again to follow up on if she is still needing to take her Pantoprazole. Says she only has been taking one a day states the side effects are scary and it is not okay to take with her other medication. Please advise

## 2023-03-02 NOTE — Telephone Encounter (Signed)
Spoke with patient about her concerns regarding the side effect insert that was given to her by the pharmacist.  We discussed that those inserts list all kinds of side effects that rarely come to pass.  She is having no problems and has no reflux symptoms.  Patient was reassured.

## 2023-03-12 ENCOUNTER — Other Ambulatory Visit: Payer: Self-pay | Admitting: Internal Medicine

## 2023-03-23 ENCOUNTER — Ambulatory Visit (INDEPENDENT_AMBULATORY_CARE_PROVIDER_SITE_OTHER): Payer: Medicare Other | Admitting: Podiatry

## 2023-03-23 ENCOUNTER — Encounter: Payer: Self-pay | Admitting: Podiatry

## 2023-03-23 DIAGNOSIS — M79675 Pain in left toe(s): Secondary | ICD-10-CM | POA: Diagnosis not present

## 2023-03-23 DIAGNOSIS — G629 Polyneuropathy, unspecified: Secondary | ICD-10-CM | POA: Insufficient documentation

## 2023-03-23 DIAGNOSIS — B351 Tinea unguium: Secondary | ICD-10-CM

## 2023-03-23 DIAGNOSIS — M79674 Pain in right toe(s): Secondary | ICD-10-CM | POA: Diagnosis not present

## 2023-03-23 NOTE — Progress Notes (Signed)
This patient presents to the office with chief complaint of long thick painful nails.  Patient says the nails are painful walking and wearing shoes.  This patient is unable to self treat.  This patient is unable to trim her nails since she is unable to reach her nails. She also says she is experiencing strange senseations through her feet.  She was seen in 2002 by Dr.  Ardelle Anton.   She presents to the office for preventative foot care services.  General Appearance  Alert, conversant and in no acute stress.  Vascular  Dorsalis pedis and posterior tibial  pulses are palpable  bilaterally.  Capillary return is within normal limits  bilaterally. Temperature is within normal limits  bilaterally.  Neurologic  Senn-Weinstein monofilament wire test within normal limits  bilaterally. Muscle power within normal limits bilaterally.  Nails Thick disfigured discolored nails with subungual debris  from hallux to fifth toes bilaterally. No evidence of bacterial infection or drainage bilaterally.  Orthopedic  No limitations of motion  feet .  No crepitus or effusions noted.  No bony pathology or digital deformities noted.  Skin  normotropic skin with no porokeratosis noted bilaterally.  No signs of infections or ulcers noted.     Onychomycosis  Nails  B/L.  Pain in right toes  Pain in left toes  Neuropathy.   ROV.  Debride nails with nail nipper and dremel tool.  Talked with patient about her foot sensations.  Told her  that she should talk with her medical doctor for possible neuropathy.  Told her that she could be treated with gabapentin or lyrica based on MD evaluation.  Patient kept asking about her possible neuropathy and treatment even as she left the building  RTC 3 months.   Helane Gunther DPM

## 2023-05-06 ENCOUNTER — Telehealth: Payer: Self-pay | Admitting: Internal Medicine

## 2023-05-06 MED ORDER — METOPROLOL SUCCINATE ER 50 MG PO TB24: ORAL_TABLET | ORAL | 2 refills | Status: DC

## 2023-05-06 NOTE — Telephone Encounter (Signed)
Patient called back to check on the status of the refill. She said she has 2 days worth of medication left. She would like a call back when the medication is sent in. Best callback is [phone].

## 2023-05-06 NOTE — Telephone Encounter (Signed)
Called pt back ask her what  did she need a refill on. Pt states Metoprolol. She states she she called twice but no notes in computer. Inform pt [facility]

## 2023-05-06 NOTE — Telephone Encounter (Signed)
Prescription Request  05/06/2023  LOV: 02/09/2023  What is the name of the medication or equipment? metoprolol succinate (TOPROL-XL) 50 MG 24 hr tablet   Have you contacted your pharmacy to request a refill? Yes   Which pharmacy would you like this sent to?  [id number] - [place], [place] - [address] AT [facility] [address] [place] [address] Phone: [phone] Fax: [phone]    Patient notified that their request is being sent to the clinical staff for review and that they should receive a response within 2 business days.   Please advise at [facility] [phone]

## 2023-05-13 ENCOUNTER — Other Ambulatory Visit: Payer: Self-pay

## 2023-05-13 ENCOUNTER — Other Ambulatory Visit: Payer: Self-pay | Admitting: Internal Medicine

## 2023-05-21 ENCOUNTER — Other Ambulatory Visit: Payer: Self-pay | Admitting: Gastroenterology

## 2023-06-15 ENCOUNTER — Encounter: Payer: Self-pay | Admitting: Podiatry

## 2023-06-15 ENCOUNTER — Ambulatory Visit (INDEPENDENT_AMBULATORY_CARE_PROVIDER_SITE_OTHER): Payer: Medicare Other | Admitting: Podiatry

## 2023-06-15 DIAGNOSIS — B351 Tinea unguium: Secondary | ICD-10-CM | POA: Diagnosis not present

## 2023-06-15 DIAGNOSIS — G629 Polyneuropathy, unspecified: Secondary | ICD-10-CM

## 2023-06-15 DIAGNOSIS — M79674 Pain in right toe(s): Secondary | ICD-10-CM | POA: Diagnosis not present

## 2023-06-15 DIAGNOSIS — M79675 Pain in left toe(s): Secondary | ICD-10-CM

## 2023-06-15 NOTE — Progress Notes (Signed)
This patient presents to the office with chief complaint of long thick painful nails.  Patient says the nails are painful walking and wearing shoes.  This patient is unable to self treat.  This patient is unable to trim her nails since she is unable to reach her nails. She also says she is experiencing strange senseations through her feet.  She was seen in 2002 by Dr.  Ardelle Anton.   She presents to the office for preventative foot care services.  General Appearance  Alert, conversant and in no acute stress.  Vascular  Dorsalis pedis and posterior tibial  pulses are palpable  bilaterally.  Capillary return is within normal limits  bilaterally. Temperature is within normal limits  bilaterally.  Neurologic  Senn-Weinstein monofilament wire test within normal limits  bilaterally. Muscle power within normal limits bilaterally.  Nails Thick disfigured discolored nails with subungual debris  from hallux to fifth toes bilaterally. No evidence of bacterial infection or drainage bilaterally.  Orthopedic  No limitations of motion  feet .  No crepitus or effusions noted.  No bony pathology or digital deformities noted.  Skin  normotropic skin with no porokeratosis noted bilaterally.  No signs of infections or ulcers noted.     Onychomycosis  Nails  B/L.  Pain in right toes  Pain in left toes  Neuropathy.   ROV.  Debride nails with nail nipper and dremel tool.  Talked with patient to talk to one of the medical doctors about neuropathy. RTC 3 months.   Helane Gunther DPM

## 2023-07-15 ENCOUNTER — Ambulatory Visit (INDEPENDENT_AMBULATORY_CARE_PROVIDER_SITE_OTHER): Payer: Medicare Other | Admitting: Podiatry

## 2023-07-15 ENCOUNTER — Encounter: Payer: Self-pay | Admitting: Podiatry

## 2023-07-15 DIAGNOSIS — M65972 Unspecified synovitis and tenosynovitis, left ankle and foot: Secondary | ICD-10-CM | POA: Diagnosis not present

## 2023-07-15 MED ORDER — METHYLPREDNISOLONE 4 MG PO TBPK: ORAL_TABLET | ORAL | 0 refills | Status: DC

## 2023-07-15 MED ORDER — MELOXICAM 15 MG PO TABS: 15.0000 mg | ORAL_TABLET | Freq: Every day | ORAL | 0 refills | Status: DC

## 2023-07-15 NOTE — Progress Notes (Signed)
Subjective:  Patient ID: Sue Green, female    DOB: 24-Jun-1946,  MRN: [id number]  Chief Complaint  Patient presents with   Foot Pain    RM# Left foot pain x 1 week no injury     77 y.o. female presents with the above complaint.  Patient presents with left ankle pain that has been going for 1 week it came out of nowhere hurts with ambulation worse with pressure wanted get it evaluated no injury noted.  Hurts with taking pressure and walk on her foot.  She denies any other acute complaints.   Review of Systems: Negative except as noted in the HPI. Denies N/V/F/Ch.  Past Medical History:  Diagnosis Date   Allergy    Anal fissure    Anemia, iron deficiency 03/06/2014   Anxiety    Arthritis    Asthma    B12 deficiency    Cholelithiasis    Chronic tension headaches    IN PAST   Depression    Duodenal stenosis    Duodenal ulcer    Fibromyalgia    GERD (gastroesophageal reflux disease)    Glaucoma     Per pt, she does not have glaucoma.   Hepatic cyst    Hypertension    Hypothyroidism    Internal hemorrhoids    Iron deficiency anemia    Osteopenia    Pyloric stenosis    Scoliosis    Thyroid disease    hypothyroidism   Vitamin D deficiency     Current Outpatient Medications:    acetaminophen (TYLENOL) 325 MG tablet, Take 2 tablets (650 mg total) by mouth every 6 (six) hours as needed for mild pain (or Fever >/= 101)., Disp: , Rfl:    AMBULATORY NON FORMULARY MEDICATION, Nitroglycerine ointment 0.125 %  Apply a pea sized amount internally and on lesion three times daily for 6 weeks, Disp: 30 g, Rfl: 0   Artificial Saliva (ACT DRY MOUTH) LOZG, Use as directed 1 lozenge in the mouth or throat every 6 (six) hours as needed (for a dry mouth)., Disp: , Rfl:    Ascorbic Acid (VITAMIN C) 500 MG tablet, Take 500 mg by mouth daily., Disp: , Rfl:    busPIRone (BUSPAR) 10 MG tablet, Take 10 mg by mouth 2 (two) times daily with a meal., Disp: , Rfl:    cholecalciferol (VITAMIN  D3) 25 MCG (1000 UNIT) tablet, Take 1,000 Units by mouth daily., Disp: , Rfl:    clobetasol cream (TEMOVATE) 0.05 %, SMARTSIG:1 Topical Daily, Disp: , Rfl:    cyclobenzaprine (FLEXERIL) 10 MG tablet, TAKE 1 TABLET BY MOUTH EVERY DAY, Disp: 30 tablet, Rfl: 2   desvenlafaxine (PRISTIQ) 100 MG 24 hr tablet, Take 100 mg by mouth daily., Disp: , Rfl:    doxycycline (VIBRAMYCIN) 100 MG capsule, Take 1 capsule (100 mg total) by mouth 2 (two) times daily., Disp: 14 capsule, Rfl: 0   Ferrous Sulfate (IRON) 28 MG TABS, Take 28 mg by mouth daily with breakfast., Disp: , Rfl:    HYDROcodone-acetaminophen (NORCO/VICODIN) 5-325 MG tablet, TAKE ONE EVERY 4-6 HOURS AS NEEDED FOR PAIN, Disp: , Rfl:    hydrocortisone (ANUSOL-HC) 2.5 % rectal cream, Place 1 application rectally 2 (two) times daily. For 7-14 days, Disp: 30 g, Rfl: 0   Krill Oil 300 MG CAPS, Take 300 mg by mouth daily., Disp: , Rfl:    meloxicam (MOBIC) 15 MG tablet, Take 1 tablet (15 mg total) by mouth daily., Disp: 30 tablet, Rfl: 0  Methylcellulose, Laxative, (CITRUCEL PO), See admin instructions. Mix 1 tablespoonful of powder into water and drink before breakfast every day, Disp: , Rfl:    methylPREDNISolone (MEDROL DOSEPAK) 4 MG TBPK tablet, Take as directed, Disp: 21 each, Rfl: 0   metoprolol succinate (TOPROL-XL) 50 MG 24 hr tablet, TAKE ONE AND 1/2 TABS BY MOUTH IN THE MORNING, Disp: 135 tablet, Rfl: 2   Misc Natural Products (OSTEO BI-FLEX TRIPLE STRENGTH) TABS, Take 1 tablet by mouth in the morning and at bedtime., Disp: , Rfl:    montelukast (SINGULAIR) 10 MG tablet, Take 1 tablet (10 mg total) by mouth daily., Disp: 90 tablet, Rfl: 3   Multiple Vitamins-Minerals (ONE-A-DAY WOMENS 50+ ADVANTAGE) TABS, Take 1 tablet by mouth daily., Disp: , Rfl:    mupirocin ointment (BACTROBAN) 2 %, 3 (three) times daily., Disp: , Rfl:    Neomycin-Bacitracin-Polymyxin (NEOSPORIN ORIGINAL EX), Apply topically., Disp: , Rfl:    OCUVITE LUTEIN 25 25-5 MG CAPS,  Take 1 capsule by mouth daily., Disp: , Rfl:    ondansetron (ZOFRAN-ODT) 4 MG disintegrating tablet, Take 1 tablet (4 mg total) by mouth every 8 (eight) hours as needed for nausea or vomiting., Disp: 15 tablet, Rfl: 0   pantoprazole (PROTONIX) 40 MG tablet, TAKE 1 TABLET BY MOUTH TWICE A DAY, Disp: 180 tablet, Rfl: 0   polyethylene glycol powder (GLYCOLAX/MIRALAX) 17 GM/SCOOP powder, Take 8.5 g by mouth See admin instructions. Mix 8.5 grams into 4-8 ounces of water and drink by mouth once a day, Disp: , Rfl:    SYNTHROID 75 MCG tablet, TAKE 1 TABLET BY MOUTH EVERY DAY, Disp: 90 tablet, Rfl: 2   vitamin B-12 (CYANOCOBALAMIN) 100 MCG tablet, Take 100 mcg by mouth daily., Disp: , Rfl:   Social History   Tobacco Use  Smoking Status Never  Smokeless Tobacco Never    Allergies  Allergen Reactions   Penicillins Hives   Doxycycline Nausea Only   Antihistamines, Diphenhydramine-Type Other (See Comments)    Reaction not recalled   Diphenhydramine Other (See Comments)    Reaction not recalled   Lorazepam Other (See Comments)    Caused trembling in the arms, per the patient   Latex Rash and Other (See Comments)    Patient disputes this in 2022   Objective:  There were no vitals filed for this visit. There is no height or weight on file to calculate BMI. Constitutional Well developed. Well nourished.  Vascular Dorsalis pedis pulses palpable bilaterally. Posterior tibial pulses palpable bilaterally. Capillary refill normal to all digits.  No cyanosis or clubbing noted. Pedal hair growth normal.  Neurologic Normal speech. Oriented to person, place, and time. Epicritic sensation to light touch grossly present bilaterally.  Dermatologic Nails well groomed and normal in appearance. No open wounds. No skin lesions.  Orthopedic: Pain to palpation to the left ankle deep intra-articular pain noted.  No pain with plantarflexion inversion of the foot.  No pain at the ATFL ligament.  No pain at the  Achilles tendon peroneal tendon posterior tibial tendon   Radiographs: None Assessment:   1. Synovitis of left ankle    Plan:  Patient was evaluated and treated and all questions answered.  Left ankle synovitis -All questions and concerns were discussed with the patient in extensive detail given the amount of pain that she is experiencing she will benefit from steroid injection help decrease inflammatory component associate with pain.  Patient agrees with plan like to proceed with steroid injection. -A steroid injection was performed  at left ankle joint using 1% plain Lidocaine and 10 mg of Kenalog. This was well tolerated.    No follow-ups on file.

## 2023-07-18 ENCOUNTER — Telehealth: Payer: Self-pay | Admitting: Podiatry

## 2023-07-18 NOTE — Telephone Encounter (Signed)
Pt called and left message on 10/25 @ 403pm stating she got the medication that Dr Allena Katz prescribed from her appt on 10/25 but he just gave her an injection that helped a lot but did not give any instructions other than that. She is confused and would like a call back about the medication. She is concerned about the side effects.  I did call and try to leave a message that provider was not in the office today but her mailbox was full.

## 2023-07-18 NOTE — Telephone Encounter (Signed)
Pt called again today in regards on what she needs to do as far as taking her medications. She explained all she received was a shot not knowing what for and that she Is not even sure what neuropathy is or what the medication is for or when to take it.  07/18/23 @4 :10pm

## 2023-07-25 ENCOUNTER — Other Ambulatory Visit: Payer: Self-pay

## 2023-08-05 ENCOUNTER — Other Ambulatory Visit: Payer: Self-pay | Admitting: Podiatry

## 2023-08-10 ENCOUNTER — Other Ambulatory Visit: Payer: Self-pay

## 2023-08-10 ENCOUNTER — Other Ambulatory Visit: Payer: Self-pay | Admitting: Internal Medicine

## 2023-08-11 ENCOUNTER — Ambulatory Visit: Payer: Medicare Other | Admitting: Podiatry

## 2023-08-23 ENCOUNTER — Other Ambulatory Visit: Payer: Self-pay

## 2023-08-23 ENCOUNTER — Other Ambulatory Visit: Payer: Self-pay | Admitting: Internal Medicine

## 2023-08-23 ENCOUNTER — Ambulatory Visit (INDEPENDENT_AMBULATORY_CARE_PROVIDER_SITE_OTHER): Payer: Medicare Other | Admitting: Podiatry

## 2023-08-23 DIAGNOSIS — M79674 Pain in right toe(s): Secondary | ICD-10-CM

## 2023-08-23 DIAGNOSIS — B351 Tinea unguium: Secondary | ICD-10-CM

## 2023-08-23 DIAGNOSIS — G629 Polyneuropathy, unspecified: Secondary | ICD-10-CM

## 2023-08-23 DIAGNOSIS — M79675 Pain in left toe(s): Secondary | ICD-10-CM

## 2023-08-23 NOTE — Progress Notes (Signed)
This patient presents to the office with chief complaint of long thick painful nails.  Patient says the nails are painful walking and wearing shoes.  This patient is unable to self treat.  This patient is unable to trim her nails since she is unable to reach her nails. She also says she is experiencing strange senseations through her feet.  She was seen in 2002 by Dr.  Ardelle Anton.   She presents to the office for preventative foot care services.  General Appearance  Alert, conversant and in no acute stress.  Vascular  Dorsalis pedis and posterior tibial  pulses are palpable  bilaterally.  Capillary return is within normal limits  bilaterally. Temperature is within normal limits  bilaterally.  Neurologic  Senn-Weinstein monofilament wire test within normal limits  bilaterally. Muscle power within normal limits bilaterally.  Nails Thick disfigured discolored nails with subungual debris  from hallux to fifth toes bilaterally. No evidence of bacterial infection or drainage bilaterally.  Orthopedic  No limitations of motion  feet .  No crepitus or effusions noted.  No bony pathology or digital deformities noted.  Skin  normotropic skin with no porokeratosis noted bilaterally.  No signs of infections or ulcers noted.     Onychomycosis  Nails  B/L.  Pain in right toes  Pain in left toes  Neuropathy.   ROV.  Debride nails with nail nipper and dremel tool.  RTC 3 months.   Helane Gunther DPM

## 2023-09-09 ENCOUNTER — Ambulatory Visit: Payer: Self-pay | Admitting: Internal Medicine

## 2023-09-09 NOTE — Telephone Encounter (Signed)
  Chief Complaint: Animal Scratch Symptoms: bleeding initially to hand Frequency: prior to calling triage Pertinent Negatives: Patient denies redness Disposition: [] ED /[x] Urgent Care (no appt availability in office) / [] Appointment(In office/virtual)/ []  [facility]/ [] Home Care/ [] Refused Recommended Disposition /[] [facility]/ []  Follow-up with PCP Additional Notes: patient called stating that her cat scratched her left hand with some bleeding to her hand. Patient states cat is up to date on vaccinations but patient unsure of when she last had a tetanus shot. Per protocol, patient is instructed to follow up with Urgent Care in the next 24 hours. Appointment made at [facility] for patient 09/10/2023. Care Advise given. All questions answered. Patient verbalized understanding of plan.   Copied from CRM [id number]. Topic: Clinical - Red Word Triage >> Sep 09, 2023  2:36 PM Elizebeth Brooking wrote: Red Word that prompted transfer to Nurse Triage: Patient stated she was scratched by kitten Reason for Disposition  [1] Last tetanus shot > 5 years ago AND [2] any wound (e.g., cut, scrape)  Answer Assessment - Initial Assessment Questions 1. ANIMAL: "What type of animal caused the bite?" "Is the injury from a bite or a claw?" If the animal is a dog or a cat, ask: "Was it a pet or a stray?" "Was it acting ill or behaving strangely?"    Cat-indoor-vaccinations updated. scratch 2. LOCATION: "Where is the bite located?"      Top of left hand 3. SIZE: "How big is the bite?" "What does it look like?"      1 inch long 4. ONSET: "When did the bite happen?" (Minutes or hours ago)      A few minutes 5. CIRCUMSTANCES: "Tell me how this happened."      Cat jumped on her hand and scratched 6. TETANUS: "When was your last tetanus booster?"     unsure 7. RABIES VACCINE: For dog or cat bites, ask: "Do you know if the pet is vaccinated against rabies?"  (e.g., yes, no, overdue for rabies shot, unknown)      Yes  Protocols used: Animal Bite-A-AH

## 2023-09-10 ENCOUNTER — Ambulatory Visit (HOSPITAL_COMMUNITY)
Admission: RE | Admit: 2023-09-10 | Discharge: 2023-09-10 | Disposition: A | Discharge status: Home / Self Care | Payer: Medicare Other | Source: Ambulatory Visit | Attending: Internal Medicine | Admitting: Internal Medicine

## 2023-09-10 ENCOUNTER — Encounter (HOSPITAL_COMMUNITY): Payer: Self-pay

## 2023-09-10 VITALS — BP 136/67 | HR 83 | Temp 98.9°F | Resp 18 | Ht 59.0 in | Wt 120.0 lb

## 2023-09-10 DIAGNOSIS — S60512A Abrasion of left hand, initial encounter: Secondary | ICD-10-CM | POA: Diagnosis not present

## 2023-09-10 DIAGNOSIS — W5503XA Scratched by cat, initial encounter: Secondary | ICD-10-CM

## 2023-09-10 DIAGNOSIS — Z23 Encounter for immunization: Secondary | ICD-10-CM

## 2023-09-10 MED ORDER — TETANUS-DIPHTH-ACELL PERTUSSIS 5-2.5-18.5 LF-MCG/0.5 IM SUSY
PREFILLED_SYRINGE | INTRAMUSCULAR | Status: AC
  Filled 2023-09-10: qty 0.5

## 2023-09-10 MED ORDER — TETANUS-DIPHTH-ACELL PERTUSSIS 5-2.5-18.5 LF-MCG/0.5 IM SUSY
0.5000 mL | PREFILLED_SYRINGE | Freq: Once | INTRAMUSCULAR | Status: AC
  Administered 2023-09-10: 0.5 mL via INTRAMUSCULAR

## 2023-09-10 NOTE — ED Triage Notes (Signed)
Pt presents with cat scratch to left hand x 1 day. Pt washed with soapy water, applied Neosporin, and covered with Band-Aid yesterday and this morning. Pt denies pain.

## 2023-09-10 NOTE — ED Provider Notes (Signed)
[facility]    CSN: [id number] Arrival date & time: 09/10/23  1600      History   Chief Complaint Chief Complaint  Patient presents with   Abrasion    Entered by patient    HPI Sue Green is a 77 y.o. female.   77 year old female who is seen in urgent care secondary to a cat scratch on her left hand.  This happened yesterday.  She did an e-visit and was told to come in as she was not up-to-date on her tetanus.  She has been keeping Neosporin and a bandage on her hand.  She denies fevers or chills.  The cat did not bite her.     Past Medical History:  Diagnosis Date   Allergy    Anal fissure    Anemia, iron deficiency 03/06/2014   Anxiety    Arthritis    Asthma    B12 deficiency    Cholelithiasis    Chronic tension headaches    IN PAST   Depression    Duodenal stenosis    Duodenal ulcer    Fibromyalgia    GERD (gastroesophageal reflux disease)    Glaucoma     Per pt, she does not have glaucoma.   Hepatic cyst    Hypertension    Hypothyroidism    Internal hemorrhoids    Iron deficiency anemia    Osteopenia    Pyloric stenosis    Scoliosis    Thyroid disease    hypothyroidism   Vitamin D deficiency     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   Duodenal ulcer    Mild intermittent asthma without complication 07/22/2021   Involuntary trembling 01/13/2021   Left sided abdominal pain 04/17/2020   Ganglion cyst of joint of finger of right hand 06/08/2019   Carotid stenosis  11/26/2016   Acute upper respiratory infection 10/05/2016   Dysuria 10/05/2016   Memory loss 08/23/2014   Acute sinus infection 08/23/2014   Anemia, iron deficiency 03/06/2014   Encounter for well adult exam with abnormal findings 07/31/2013   Urinary urgency 07/17/2012   Chronic cough 07/17/2012   Essential hypertension 07/26/2011   Anxiety and depression 07/26/2011   Constipation 07/26/2011   GERD (gastroesophageal reflux disease) 07/26/2011   Osteopenia 07/26/2011   Tension headache 07/26/2011   Hypothyroidism 07/26/2011   History of alcohol abuse 07/26/2011   Allergic rhinitis 07/26/2011   Fibromyalgia 07/26/2011    Past Surgical History:  Procedure Laterality Date   APPENDECTOMY  1975   BIOPSY  07/24/2021   Procedure: BIOPSY;  Surgeon: Meryl Dare, MD;  Location: [facility];  Service: Endoscopy;;   ESOPHAGOGASTRODUODENOSCOPY (EGD) WITH PROPOFOL N/A 07/24/2021   Procedure: ESOPHAGOGASTRODUODENOSCOPY (EGD) WITH PROPOFOL;  Surgeon: Meryl Dare, MD;  Location: [facility];  Service: Endoscopy;  Laterality: N/A;   SHOULDER ARTHROSCOPY  2001   rt shoulder   TONSILLECTOMY AND ADENOIDECTOMY     77 years old    OB History   No obstetric history on file.  Home Medications    Prior to Admission medications   Medication Sig Start Date End Date Taking? Authorizing Provider  acetaminophen (TYLENOL) 325 MG tablet Take 2 tablets (650 mg total) by mouth every 6 (six) hours as needed for mild pain (or Fever >/= 101). 07/25/21   Lonia Blood, MD  AMBULATORY NON FORMULARY MEDICATION Nitroglycerine ointment 0.125 %  Apply a pea sized amount internally and on lesion three times daily for 6 weeks 07/08/21   Unk Lightning, PA  Artificial Saliva (ACT DRY MOUTH) LOZG Use as directed 1 lozenge in the mouth or throat every 6 (six) hours as needed (for a dry mouth).    [provider]  Ascorbic Acid (VITAMIN C) 500 MG tablet Take 500 mg by mouth daily.     [provider]  busPIRone (BUSPAR) 10 MG tablet Take 10 mg by mouth 2 (two) times daily with a meal.    [provider]  cholecalciferol (VITAMIN D3) 25 MCG (1000 UNIT) tablet Take 1,000 Units by mouth daily.    [provider]  clobetasol cream (TEMOVATE) 0.05 % SMARTSIG:1 Topical Daily    [provider]  cyclobenzaprine (FLEXERIL) 10 MG tablet TAKE 1 TABLET BY MOUTH EVERY DAY 08/10/23   Corwin Levins, MD  desvenlafaxine (PRISTIQ) 100 MG 24 hr tablet Take 100 mg by mouth daily.    [provider]  doxycycline (VIBRAMYCIN) 100 MG capsule Take 1 capsule (100 mg total) by mouth 2 (two) times daily. 06/09/22   Mardella Layman, MD  Ferrous Sulfate (IRON) 28 MG TABS Take 28 mg by mouth daily with breakfast.    [provider]  HYDROcodone-acetaminophen (NORCO/VICODIN) 5-325 MG tablet TAKE ONE EVERY 4-6 HOURS AS NEEDED FOR PAIN    [provider]  hydrocortisone (ANUSOL-HC) 2.5 % rectal cream Place 1 application rectally 2 (two) times daily. For 7-14 days 11/18/21   Unk Lightning, PA  Krill Oil 300 MG CAPS Take 300 mg by mouth daily.    [provider]  meloxicam (MOBIC) 15 MG tablet TAKE 1 TABLET (15 MG TOTAL) BY MOUTH DAILY. 08/05/23   Standiford, Jenelle Mages, DPM  Methylcellulose, Laxative, (CITRUCEL PO) See admin instructions. Mix 1 tablespoonful of powder into water and drink before breakfast every day    [provider]  methylPREDNISolone (MEDROL DOSEPAK) 4 MG TBPK tablet Take as directed 07/15/23   Candelaria Stagers, DPM  metoprolol succinate (TOPROL-XL) 50 MG 24 hr tablet TAKE ONE AND 1/2 TABS BY MOUTH IN THE MORNING 05/06/23   Corwin Levins, MD  Misc Natural Products (OSTEO BI-FLEX TRIPLE STRENGTH) TABS Take 1 tablet by mouth in the morning and at bedtime.    [provider]  montelukast (SINGULAIR) 10 MG tablet TAKE 1 TABLET BY MOUTH EVERY DAY 08/23/23   Corwin Levins, MD  Multiple Vitamins-Minerals  (ONE-A-DAY WOMENS 50+ ADVANTAGE) TABS Take 1 tablet by mouth daily.    [provider]  mupirocin ointment (BACTROBAN) 2 % 3 (three) times daily. 03/25/22   [provider]  Neomycin-Bacitracin-Polymyxin (NEOSPORIN ORIGINAL EX) Apply topically.    [provider]  OCUVITE LUTEIN 25 25-5 MG CAPS Take 1 capsule by mouth daily.    [provider]  ondansetron (ZOFRAN-ODT) 4 MG disintegrating tablet Take 1 tablet (4 mg total) by mouth every 8 (eight) hours as needed for nausea or vomiting. 06/09/22   Mardella Layman, MD  pantoprazole (PROTONIX) 40 MG tablet TAKE 1 TABLET BY MOUTH TWICE  A DAY 05/24/23   Meryl Dare, MD  polyethylene glycol powder (GLYCOLAX/MIRALAX) 17 GM/SCOOP powder Take 8.5 g by mouth See admin instructions. Mix 8.5 grams into 4-8 ounces of water and drink by mouth once a day    [provider]  SYNTHROID 75 MCG tablet TAKE 1 TABLET BY MOUTH EVERY DAY 03/14/23   Corwin Levins, MD  vitamin B-12 (CYANOCOBALAMIN) 100 MCG tablet Take 100 mcg by mouth daily.    [provider]    Family History Family History  Problem Relation Age of Onset   Diabetes Mother    Arthritis Mother    Cancer Father    Diabetes Father    Colon cancer Neg Hx     Social History Social History   Tobacco Use   Smoking status: Never   Smokeless tobacco: Never  Vaping Use   Vaping status: Never Used  Substance Use Topics   Alcohol use: No    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   Drug use: No     Allergies   Penicillins; Doxycycline; Antihistamines, diphenhydramine-type; Diphenhydramine; Lorazepam; and Latex   Review of Systems Review of Systems  Constitutional:  Negative for chills and fever.  HENT:  Negative for ear pain and sore throat.   Eyes:  Negative for pain and visual disturbance.  Respiratory:  Negative for cough and shortness of breath.   Cardiovascular:  Negative for chest pain and  palpitations.  Gastrointestinal:  Negative for abdominal pain and vomiting.  Genitourinary:  Negative for dysuria and hematuria.  Musculoskeletal:  Negative for arthralgias and back pain.  Skin:  Negative for color change and rash.       Scratch on the dorsal aspect of the left hand along the fifth metacarpal  Neurological:  Negative for seizures and syncope.  All other systems reviewed and are negative.    Physical Exam Triage Vital Signs ED Triage Vitals  Encounter Vitals Group     BP      Systolic BP Percentile      Diastolic BP Percentile      Pulse      Resp      Temp      Temp src      SpO2      Weight      Height      Head Circumference      Peak Flow      Pain Score      Pain Loc      Pain Education      Exclude from Growth Chart    No data found.  Updated Vital Signs There were no vitals taken for this visit.  Visual Acuity Right Eye Distance:   Left Eye Distance:   Bilateral Distance:    Right Eye Near:   Left Eye Near:    Bilateral Near:     Physical Exam Vitals and nursing note reviewed.  Constitutional:      General: She is not in acute distress.    Appearance: She is well-developed.  HENT:     Head: Normocephalic and atraumatic.  Eyes:     Conjunctiva/sclera: Conjunctivae normal.  Cardiovascular:     Rate and Rhythm: Normal rate and regular rhythm.     Heart sounds: No murmur heard. Pulmonary:     Effort: Pulmonary effort is normal. No respiratory distress.     Breath sounds: Normal breath sounds.  Abdominal:  Palpations: Abdomen is soft.     Tenderness: There is no abdominal tenderness.  Musculoskeletal:        General: No swelling.       Hands:     Cervical back: Neck supple.     Comments: Superficial abrasion without signs of infection  Skin:    General: Skin is warm and dry.     Capillary Refill: Capillary refill takes less than 2 seconds.  Neurological:     Mental Status: She is alert.  Psychiatric:        Mood and  Affect: Mood normal.      UC Treatments / Results  Labs (all labs ordered are listed, but only abnormal results are displayed) Labs Reviewed - No data to display  EKG   Radiology No results found.  Procedures Procedures (including critical care time)  Medications Ordered in UC Medications - No data to display  Initial Impression / Assessment and Plan / UC Course  I have reviewed the triage vital signs and the nursing notes.  Pertinent labs & imaging results that were available during my care of the patient were reviewed by me and considered in my medical decision making (see chart for details).     Cat scratch of left hand, initial encounter  Cat scratch on the left hand. This does not appear infected. Need to update tetanus.  Tetanus booster given today Use antibacterial ointment on the area twice daily until the area is completely healed Ok to wash the area with soap and water.  Return to urgent care or PCP if symptoms worsen or fail to resolve.    Final Clinical Impressions(s) / UC Diagnoses   Final diagnoses:  None   Discharge Instructions   None    ED Prescriptions   None    PDMP not reviewed this encounter.   Landis Martins, PA-C 09/10/23 1640

## 2023-09-10 NOTE — Discharge Instructions (Addendum)
Cat scratch on the left hand. This does not appear infected. Need to update tetanus.  Tetanus booster given today Use antibacterial ointment on the area twice daily until the area is completely healed Ok to wash the area with soap and water.  Return to urgent care or PCP if symptoms worsen or fail to resolve.

## 2023-09-15 ENCOUNTER — Telehealth: Payer: Self-pay

## 2023-09-15 NOTE — Telephone Encounter (Unsigned)
Copied from CRM [id number]. Topic: Clinical - Medical Advice >> Sep 15, 2023  1:27 PM Orinda Kenner C wrote: Reason for CRM: Last Friday had an accident with her kitty which torn her L hand. Pt was advised to go to urgent care, pt was instructed to use Neosporin, received a Tetanus shot, wash twice a day, apply ointment and bandage. Pt c/o the area is itchy and not completely healed. Pt wants to know how long it will heal. Pt denies a fever or pain. Pls advise and c/b [phone].   Pt wants the info of the urgent care 09/10/23, read pt the d/c and provided information. Pt wanted to contact urgent care for them to go through the aftercare instructions.

## 2023-09-16 ENCOUNTER — Ambulatory Visit (HOSPITAL_COMMUNITY)
Admission: RE | Admit: 2023-09-16 | Discharge: 2023-09-16 | Disposition: A | Discharge status: Home / Self Care | Payer: Medicare Other | Source: Ambulatory Visit | Attending: Family Medicine | Admitting: Family Medicine

## 2023-09-16 ENCOUNTER — Encounter (HOSPITAL_COMMUNITY): Payer: Self-pay

## 2023-09-16 VITALS — BP 185/74 | HR 98 | Temp 98.0°F | Resp 16 | Ht 59.0 in | Wt 119.9 lb

## 2023-09-16 DIAGNOSIS — A281 Cat-scratch disease: Secondary | ICD-10-CM | POA: Diagnosis not present

## 2023-09-16 MED ORDER — AZITHROMYCIN 250 MG PO TABS: ORAL_TABLET | ORAL | 0 refills | Status: AC

## 2023-09-16 NOTE — Telephone Encounter (Signed)
This would normally take at least 1-2 weeks, so ok to continue the current tx

## 2023-09-16 NOTE — Discharge Instructions (Signed)
Azithromycin 250 mg--take 2 orally the first day, then 1 daily for 4 more days.  This is an antibiotic intended to treat infection secondary to the cat scratch, or "cat scratch disease"  Continue cleansing your wound once a day and applying new antibiotic ointment.  You can leave it open a good bit of the day.  If you want to protect it, then bandage it

## 2023-09-16 NOTE — ED Provider Notes (Signed)
[facility]    CSN: [id number] Arrival date & time: 09/16/23  1336      History   Chief Complaint Chief Complaint  Patient presents with   Appointment   Hand Injury    HPI Sue Green is a 77 y.o. female.    Hand Injury Here for recheck on her left hand.  On December 28 she sustained a cat scratch from one of her own cats when the cat jumped up or her hand was sitting on a piece of furniture.  She was seen here December 21 and a tetanus update was administered.  She has been doing wound care but she has not been leaving the wound open so as she was instructed.  She is cleaning it twice daily and putting on Neosporin.  She is noticing some redness and some itching around it.  She is allergic to penicillins, doxycyclines    Past Medical History:  Diagnosis Date   Allergy    Anal fissure    Anemia, iron deficiency 03/06/2014   Anxiety    Arthritis    Asthma    B12 deficiency    Cholelithiasis    Chronic tension headaches    IN PAST   Depression    Duodenal stenosis    Duodenal ulcer    Fibromyalgia    GERD (gastroesophageal reflux disease)    Glaucoma     Per pt, she does not have glaucoma.   Hepatic cyst    Hypertension    Hypothyroidism    Internal hemorrhoids    Iron deficiency anemia    Osteopenia    Pyloric stenosis    Scoliosis    Thyroid disease    hypothyroidism   Vitamin D deficiency     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   Duodenal ulcer    Mild  intermittent asthma without complication 07/22/2021   Involuntary trembling 01/13/2021   Left sided abdominal pain 04/17/2020   Ganglion cyst of joint of finger of right hand 06/08/2019   Carotid stenosis 11/26/2016   Acute upper respiratory infection 10/05/2016   Dysuria 10/05/2016   Memory loss 08/23/2014   Acute sinus infection 08/23/2014   Anemia, iron deficiency 03/06/2014   Encounter for well adult exam with abnormal findings 07/31/2013   Urinary urgency 07/17/2012   Chronic cough 07/17/2012   Essential hypertension 07/26/2011   Anxiety and depression 07/26/2011   Constipation 07/26/2011   GERD (gastroesophageal reflux disease) 07/26/2011   Osteopenia 07/26/2011   Tension headache 07/26/2011   Hypothyroidism 07/26/2011   History of alcohol abuse 07/26/2011   Allergic rhinitis 07/26/2011   Fibromyalgia 07/26/2011    Past Surgical History:  Procedure Laterality Date   APPENDECTOMY  1975   BIOPSY  07/24/2021   Procedure: BIOPSY;  Surgeon: Meryl Dare, MD;  Location: [facility];  Service: Endoscopy;;   ESOPHAGOGASTRODUODENOSCOPY (EGD) WITH PROPOFOL N/A 07/24/2021   Procedure: ESOPHAGOGASTRODUODENOSCOPY (EGD) WITH PROPOFOL;  Surgeon: Meryl Dare, MD;  Location: [facility];  Service: Endoscopy;  Laterality: N/A;  SHOULDER ARTHROSCOPY  2001   rt shoulder   TONSILLECTOMY AND ADENOIDECTOMY     77 years old    OB History   No obstetric history on file.      Home Medications    Prior to Admission medications   Medication Sig Start Date End Date Taking? Authorizing Provider  acetaminophen (TYLENOL) 325 MG tablet Take 2 tablets (650 mg total) by mouth every 6 (six) hours as needed for mild pain (or Fever >/= 101). 07/25/21  Yes Lonia Blood, MD  AMBULATORY NON FORMULARY MEDICATION Nitroglycerine ointment 0.125 %  Apply a pea sized amount internally and on lesion three times daily for 6 weeks 07/08/21  Yes Lemmon, Violet Baldy, PA  Artificial Saliva (ACT  DRY MOUTH) LOZG Use as directed 1 lozenge in the mouth or throat every 6 (six) hours as needed (for a dry mouth).   Yes [provider]  Ascorbic Acid (VITAMIN C) 500 MG tablet Take 500 mg by mouth daily.   Yes [provider]  azithromycin (ZITHROMAX) 250 MG tablet Take first 2 tablets together, then 1 every day until finished. 09/16/23  Yes Joyclyn Plazola, Janace Aris, MD  busPIRone (BUSPAR) 10 MG tablet Take 10 mg by mouth 2 (two) times daily with a meal.   Yes [provider]  cholecalciferol (VITAMIN D3) 25 MCG (1000 UNIT) tablet Take 1,000 Units by mouth daily.   Yes [provider]  clobetasol cream (TEMOVATE) 0.05 % SMARTSIG:1 Topical Daily   Yes [provider]  cyclobenzaprine (FLEXERIL) 10 MG tablet TAKE 1 TABLET BY MOUTH EVERY DAY 08/10/23  Yes Corwin Levins, MD  desvenlafaxine (PRISTIQ) 100 MG 24 hr tablet Take 100 mg by mouth daily.   Yes [provider]  Ferrous Sulfate (IRON) 28 MG TABS Take 28 mg by mouth daily with breakfast.   Yes [provider]  HYDROcodone-acetaminophen (NORCO/VICODIN) 5-325 MG tablet TAKE ONE EVERY 4-6 HOURS AS NEEDED FOR PAIN   Yes [provider]  hydrocortisone (ANUSOL-HC) 2.5 % rectal cream Place 1 application rectally 2 (two) times daily. For 7-14 days 11/18/21  Yes Unk Lightning, [place]  Krill Oil 300 MG CAPS Take 300 mg by mouth daily.   Yes [provider]  meloxicam (MOBIC) 15 MG tablet TAKE 1 TABLET (15 MG TOTAL) BY MOUTH DAILY. 08/05/23  Yes Standiford, Jenelle Mages, DPM  Methylcellulose, Laxative, (CITRUCEL PO) See admin instructions. Mix 1 tablespoonful of powder into water and drink before breakfast every day   Yes [provider]  metoprolol succinate (TOPROL-XL) 50 MG 24 hr tablet TAKE ONE AND 1/2 TABS BY MOUTH IN THE MORNING 05/06/23  Yes Corwin Levins, MD  Misc Natural Products (OSTEO BI-FLEX TRIPLE STRENGTH) TABS Take 1 tablet by mouth in the morning and at  bedtime.   Yes [provider]  montelukast (SINGULAIR) 10 MG tablet TAKE 1 TABLET BY MOUTH EVERY DAY 08/23/23  Yes Corwin Levins, MD  Multiple Vitamins-Minerals (ONE-A-DAY WOMENS 50+ ADVANTAGE) TABS Take 1 tablet by mouth daily.   Yes [provider]  mupirocin ointment (BACTROBAN) 2 % 3 (three) times daily. 03/25/22  Yes [provider]  Neomycin-Bacitracin-Polymyxin (NEOSPORIN ORIGINAL EX) Apply topically.   Yes [provider]  OCUVITE LUTEIN 25 25-5 MG CAPS Take 1 capsule by mouth daily.   Yes [provider]  pantoprazole (PROTONIX) 40 MG tablet TAKE 1 TABLET BY MOUTH TWICE A DAY 05/24/23  Yes Meryl Dare, MD  polyethylene glycol  powder (GLYCOLAX/MIRALAX) 17 GM/SCOOP powder Take 8.5 g by mouth See admin instructions. Mix 8.5 grams into 4-8 ounces of water and drink by mouth once a day   Yes [provider]  SYNTHROID 75 MCG tablet TAKE 1 TABLET BY MOUTH EVERY DAY 03/14/23  Yes Corwin Levins, MD  vitamin B-12 (CYANOCOBALAMIN) 100 MCG tablet Take 100 mcg by mouth daily.   Yes [provider]    Family History Family History  Problem Relation Age of Onset   Diabetes Mother    Arthritis Mother    Cancer Father    Diabetes Father    Colon cancer Neg Hx     Social History Social History   Tobacco Use   Smoking status: Never   Smokeless tobacco: Never  Vaping Use   Vaping status: Never Used  Substance Use Topics   Alcohol use: No    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   Drug use: No     Allergies   Penicillins; Doxycycline; Antihistamines, diphenhydramine-type; Diphenhydramine; Lorazepam; and Latex   Review of Systems Review of Systems   Physical Exam Triage Vital Signs ED Triage Vitals  Encounter Vitals Group     BP 09/16/23 1353 (!) 185/74     Systolic BP Percentile --      Diastolic BP Percentile --      Pulse Rate 09/16/23 1353 98     Resp 09/16/23 1353 16      Temp 09/16/23 1353 98 F (36.7 C)     Temp Source 09/16/23 1353 Oral     SpO2 09/16/23 1353 96 %     Weight 09/16/23 1352 119 lb 14.9 oz (54.4 kg)     Height 09/16/23 1352 4\' 11"  (1.499 m)     Head Circumference --      Peak Flow --      Pain Score 09/16/23 1351 0     Pain Loc --      Pain Education --      Exclude from Growth Chart --    No data found.  Updated Vital Signs BP (!) 185/74 (BP Location: Left Wrist)   Pulse 98   Temp 98 F (36.7 C) (Oral)   Resp 16   Ht 4\' 11"  (1.499 m)   Wt 54.4 kg   SpO2 96%   BMI 24.22 kg/m   Visual Acuity Right Eye Distance:   Left Eye Distance:   Bilateral Distance:    Right Eye Near:   Left Eye Near:    Bilateral Near:     Physical Exam Vitals reviewed.  Constitutional:      General: She is not in acute distress.    Appearance: She is not ill-appearing, toxic-appearing or diaphoretic.  Skin:    Coloration: Skin is not jaundiced or pale.     Comments: There is a linear abrasion that is healing on the dorsum of the left hand.  It is currently about 3 cm in length.  On either end there is a little bit of white material.  Around it there are a few tiny erythematous spots with a little bit of white center.  There is no induration or fluctuance.  There is a haze of erythema around the laceration.  There is no induration there and that he is of erythema.  Neurological:     Mental Status: She is alert and oriented to person, place, and time.  Psychiatric:  Behavior: Behavior normal.      UC Treatments / Results  Labs (all labs ordered are listed, but only abnormal results are displayed) Labs Reviewed - No data to display  EKG   Radiology No results found.  Procedures Procedures (including critical care time)  Medications Ordered in UC Medications - No data to display  Initial Impression / Assessment and Plan / UC Course  I have reviewed the triage vital signs and the nursing notes.  Pertinent labs & imaging  results that were available during my care of the patient were reviewed by me and considered in my medical decision making (see chart for details).   I think some of the erythema is may be due to irritation from her having the adhesive on her hands are persistently.  I do think I see some signs of cat scratch infection beginning.  Z-Pak is sent in to treat the cat scratch.  I have applied a dressing with a little bit of a nonstick gauze and some Coban.  Of asked her to leave it open some but have given her the leftover nonstick pad and Coban to use for further dressings if she wants to.   Final Clinical Impressions(s) / UC Diagnoses   Final diagnoses:  Cat-scratch disease     Discharge Instructions      Azithromycin 250 mg--take 2 orally the first day, then 1 daily for 4 more days.  This is an antibiotic intended to treat infection secondary to the cat scratch, or "cat scratch disease"  Continue cleansing your wound once a day and applying new antibiotic ointment.  You can leave it open a good bit of the day.  If you want to protect it, then bandage it     ED Prescriptions     Medication Sig Dispense Auth. Provider   azithromycin (ZITHROMAX) 250 MG tablet Take first 2 tablets together, then 1 every day until finished. 6 tablet Marlinda Mike Janace Aris, MD      PDMP not reviewed this encounter.   Zenia Resides, MD 09/16/23 [phone]

## 2023-09-16 NOTE — ED Triage Notes (Signed)
Follow up for left hand injury onset 09/09/23. Patient was scratched by her cat. Has been using neosporin and covering with a Band-Aid twice a day.   Patient reports slight bleeding ongoing and some itching.

## 2023-09-20 ENCOUNTER — Ambulatory Visit (HOSPITAL_COMMUNITY)
Admission: RE | Admit: 2023-09-20 | Discharge: 2023-09-20 | Disposition: A | Discharge status: Home / Self Care | Payer: Medicare Other | Source: Ambulatory Visit | Attending: Internal Medicine | Admitting: Internal Medicine

## 2023-09-20 ENCOUNTER — Encounter (HOSPITAL_COMMUNITY): Payer: Self-pay

## 2023-09-20 VITALS — BP 148/73 | HR 92 | Temp 99.5°F | Resp 18

## 2023-09-20 DIAGNOSIS — R238 Other skin changes: Secondary | ICD-10-CM | POA: Diagnosis not present

## 2023-09-20 MED ORDER — TRIAMCINOLONE ACETONIDE 0.025 % EX OINT: 1.0000 | TOPICAL_OINTMENT | Freq: Two times a day (BID) | CUTANEOUS | 0 refills | Status: AC

## 2023-09-20 NOTE — ED Provider Notes (Signed)
 [facility]    CSN: [id number] Arrival date & time: 09/20/23  1337      History   Chief Complaint Chief Complaint  Patient presents with   Wound Check    HPI Sue Green is a 77 y.o. female presents stating that the redness on her L dorsal hand where she had a linear scratch from her cat is getting larger, but is not painful, has not been oozing or bleeding. He has had mostly itching. Has been using Neosporin and applying band aids or tape over the dressing. She has one more Azithromycin  to take tomorrow and wonders if she should have a refill.     Past Medical History:  Diagnosis Date   Allergy    Anal fissure    Anemia, iron deficiency 03/06/2014   Anxiety    Arthritis    Asthma    B12 deficiency    Cholelithiasis    Chronic tension headaches    IN PAST   Depression    Duodenal stenosis    Duodenal ulcer    Fibromyalgia    GERD (gastroesophageal reflux disease)    Glaucoma     Per pt, she does not have glaucoma.   Hepatic cyst    Hypertension    Hypothyroidism    Internal hemorrhoids    Iron deficiency anemia    Osteopenia    Pyloric stenosis    Scoliosis    Thyroid  disease    hypothyroidism   Vitamin D  deficiency     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   Duodenal ulcer    Mild intermittent asthma without complication 07/22/2021   Involuntary trembling 01/13/2021   Left sided abdominal pain 04/17/2020   Ganglion cyst of joint of finger of right hand 06/08/2019    Carotid stenosis 11/26/2016   Acute upper respiratory infection 10/05/2016   Dysuria 10/05/2016   Memory loss 08/23/2014   Acute sinus infection 08/23/2014   Anemia, iron deficiency 03/06/2014   Encounter for well adult exam with abnormal findings 07/31/2013   Urinary urgency 07/17/2012   Chronic cough 07/17/2012   Essential hypertension 07/26/2011   Anxiety and depression 07/26/2011   Constipation 07/26/2011   GERD (gastroesophageal reflux disease) 07/26/2011   Osteopenia 07/26/2011   Tension headache 07/26/2011   Hypothyroidism 07/26/2011   History of alcohol abuse 07/26/2011   Allergic rhinitis 07/26/2011   Fibromyalgia 07/26/2011    Past Surgical History:  Procedure Laterality Date   APPENDECTOMY  1975   BIOPSY  07/24/2021   Procedure: BIOPSY;  Surgeon: Aneita Gwendlyn DASEN, MD;  Location: [facility];  Service: Endoscopy;;   ESOPHAGOGASTRODUODENOSCOPY (EGD) WITH PROPOFOL  N/A 07/24/2021   Procedure: ESOPHAGOGASTRODUODENOSCOPY (EGD) WITH PROPOFOL ;  Surgeon: Aneita Gwendlyn DASEN, MD;  Location: [facility];  Service: Endoscopy;  Laterality: N/A;   SHOULDER ARTHROSCOPY  2001   rt shoulder   TONSILLECTOMY AND ADENOIDECTOMY     77 years old    OB History   No obstetric history on file.      Home Medications  Prior to Admission medications   Medication Sig Start Date End Date Taking? Authorizing Provider  AMBULATORY NON FORMULARY MEDICATION Nitroglycerine ointment 0.125 %  Apply a pea sized amount internally and on lesion three times daily for 6 weeks 07/08/21  Yes Lemmon, Delon Gibson, PA  Artificial Saliva (ACT DRY MOUTH) LOZG Use as directed 1 lozenge in the mouth or throat every 6 (six) hours as needed (for a dry mouth).   Yes [provider]  Ascorbic Acid (VITAMIN C) 500 MG tablet Take 500 mg by mouth daily.   Yes [provider]  azithromycin  (ZITHROMAX ) 250 MG tablet Take first 2 tablets together, then 1 every day until finished. 09/16/23  Yes  Banister, Pamela K, MD  busPIRone (BUSPAR) 10 MG tablet Take 10 mg by mouth 2 (two) times daily with a meal.   Yes [provider]  cholecalciferol (VITAMIN D3) 25 MCG (1000 UNIT) tablet Take 1,000 Units by mouth daily.   Yes [provider]  clobetasol cream (TEMOVATE) 0.05 % SMARTSIG:1 Topical Daily   Yes [provider]  cyclobenzaprine  (FLEXERIL ) 10 MG tablet TAKE 1 TABLET BY MOUTH EVERY DAY 08/10/23  Yes Norleen Lynwood ORN, MD  desvenlafaxine (PRISTIQ) 100 MG 24 hr tablet Take 100 mg by mouth daily.   Yes [provider]  Ferrous Sulfate (IRON) 28 MG TABS Take 28 mg by mouth daily with breakfast.   Yes [provider]  Krill Oil 300 MG CAPS Take 300 mg by mouth daily.   Yes [provider]  meloxicam  (MOBIC ) 15 MG tablet TAKE 1 TABLET (15 MG TOTAL) BY MOUTH DAILY. 08/05/23  Yes Standiford, Marsa FALCON, DPM  Methylcellulose, Laxative, (CITRUCEL PO) See admin instructions. Mix 1 tablespoonful of powder into water and drink before breakfast every day   Yes [provider]  metoprolol  succinate (TOPROL -XL) 50 MG 24 hr tablet TAKE ONE AND 1/2 TABS BY MOUTH IN THE MORNING 05/06/23  Yes Norleen Lynwood ORN, MD  Misc Natural Products (OSTEO BI-FLEX TRIPLE STRENGTH) TABS Take 1 tablet by mouth in the morning and at bedtime.   Yes [provider]  montelukast  (SINGULAIR ) 10 MG tablet TAKE 1 TABLET BY MOUTH EVERY DAY 08/23/23  Yes Norleen Lynwood ORN, MD  Multiple Vitamins-Minerals (ONE-A-DAY WOMENS 50+ ADVANTAGE) TABS Take 1 tablet by mouth daily.   Yes [provider]  mupirocin ointment (BACTROBAN) 2 % 3 (three) times daily. 03/25/22  Yes [provider]  Neomycin-Bacitracin-Polymyxin (NEOSPORIN ORIGINAL EX) Apply topically.   Yes [provider]  OCUVITE LUTEIN 25 25-5 MG CAPS Take 1 capsule by mouth daily.   Yes [provider]  pantoprazole  (PROTONIX ) 40 MG tablet TAKE 1 TABLET BY MOUTH TWICE A DAY 05/24/23  Yes  Aneita Gwendlyn DASEN, MD  polyethylene glycol powder (GLYCOLAX /MIRALAX ) 17 GM/SCOOP powder Take 8.5 g by mouth See admin instructions. Mix 8.5 grams into 4-8 ounces of water and drink by mouth once a day   Yes [provider]  SYNTHROID  75 MCG tablet TAKE 1 TABLET BY MOUTH EVERY DAY 03/14/23  Yes Norleen Lynwood ORN, MD  triamcinolone  (KENALOG ) 0.025 % ointment Apply 1 Application topically 2 (two) times daily. Apply lightly twice a day for 5 days 09/20/23  Yes Rodriguez-Southworth, Kyra, PA-C  vitamin B-12 (CYANOCOBALAMIN ) 100 MCG tablet Take 100 mcg by mouth daily.   Yes [provider]  acetaminophen  (TYLENOL ) 325 MG tablet Take 2 tablets (650 mg total) by mouth every 6 (six) hours as needed for mild  pain (or Fever >/= 101). 07/25/21   Danton Reyes DASEN, MD  HYDROcodone -acetaminophen  (NORCO/VICODIN) 5-325 MG tablet TAKE ONE EVERY 4-6 HOURS AS NEEDED FOR PAIN    [provider]  hydrocortisone  (ANUSOL -HC) 2.5 % rectal cream Place 1 application rectally 2 (two) times daily. For 7-14 days 11/18/21   Beather Delon Gibson, PA    Family History Family History  Problem Relation Age of Onset   Diabetes Mother    Arthritis Mother    Cancer Father    Diabetes Father    Colon cancer Neg Hx     Social History Social History   Tobacco Use   Smoking status: Never   Smokeless tobacco: Never  Vaping Use   Vaping status: Never Used  Substance Use Topics   Alcohol use: No    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   Drug use: No     Allergies   Penicillins; Doxycycline ; Antihistamines, diphenhydramine-type; Diphenhydramine; Lorazepam ; and Latex   Review of Systems Review of Systems As noted in HPI  Physical Exam Triage Vital Signs ED Triage Vitals  Encounter Vitals Group     BP 09/20/23 1355 (!) 148/73     Systolic BP Percentile --      Diastolic BP Percentile --      Pulse Rate 09/20/23 1355 92     Resp 09/20/23 1355 18      Temp 09/20/23 1355 99.5 F (37.5 C)     Temp Source 09/20/23 1355 Oral     SpO2 09/20/23 1355 94 %     Weight --      Height --      Head Circumference --      Peak Flow --      Pain Score 09/20/23 1354 0     Pain Loc --      Pain Education --      Exclude from Growth Chart --    No data found.  Updated Vital Signs BP (!) 148/73 (BP Location: Left Arm)   Pulse 92   Temp 99.5 F (37.5 C) (Oral)   Resp 18   SpO2 94%   Visual Acuity Right Eye Distance:   Left Eye Distance:   Bilateral Distance:    Right Eye Near:   Left Eye Near:    Bilateral Near:     Physical Exam Vitals and nursing note reviewed.  Constitutional:      General: She is not in acute distress.    Appearance: She is not toxic-appearing.  HENT:     Right Ear: External ear normal.     Left Ear: External ear normal.  Eyes:     Conjunctiva/sclera: Conjunctivae normal.  Pulmonary:     Effort: Pulmonary effort is normal.  Musculoskeletal:        General: No swelling. Normal range of motion.     Cervical back: Neck supple.     Right lower leg: No edema.     Left lower leg: No edema.  Skin:    General: Skin is warm and dry.     Comments: L HAND- dorsal area has erythema, but is not hot or tender. Looks more like a local reaction. There are no streaking towards her lymphatics. ROM is normal.   Neurological:     Mental Status: She is alert and oriented to person, place, and time.     Gait: Gait normal.     Comments: Seems to have memory  issues since she asked me the same questions multiple times   Psychiatric:        Mood and Affect: Mood normal.        Behavior: Behavior normal.        Thought Content: Thought content normal.        Judgment: Judgment normal.      UC Treatments / Results  Labs (all labs ordered are listed, but only abnormal results are displayed) Labs Reviewed - No data to display  EKG   Radiology No results found.  Procedures Procedures (including critical care  time)  Medications Ordered in UC Medications - No data to display  Initial Impression / Assessment and Plan / UC Course  I have reviewed the triage vital signs and the nursing notes.  I believe the redness is local irritation from the tape or Neosporin, so I advised her to d/c both and finish the last azithromycin  and told she has 5 more days in her system the medication is working. I wrote all her instructions in detail after I answered her questions several times. I will have her apply triamcinolone  as noted.      Final Clinical Impressions(s) / UC Diagnoses   Final diagnoses:  Irritation symptom of skin     Discharge Instructions      Stop using Neosporin or tape and leave your hand open to air. There is no open wound for you to need to cover this You seem to have a local reaction The antibiotic you are taking will work for 5 more days once you finish it  You may see the dermatologist if this gets worse.     ED Prescriptions     Medication Sig Dispense Auth. Provider   triamcinolone  (KENALOG ) 0.025 % ointment Apply 1 Application topically 2 (two) times daily. Apply lightly twice a day for 5 days 30 g Rodriguez-Southworth, Kyra, PA-C      PDMP not reviewed this encounter.   Lindi Kyra, PA-C 09/20/23 2054

## 2023-09-20 NOTE — Discharge Instructions (Addendum)
 Stop using Neosporin or tape and leave your hand open to air. There is no open wound for you to need to cover this You seem to have a local reaction The antibiotic you are taking will work for 5 more days once you finish it  You may see the dermatologist if this gets worse.

## 2023-09-20 NOTE — Telephone Encounter (Signed)
Pt has appt with UC today.

## 2023-09-20 NOTE — ED Triage Notes (Signed)
 Pt states that her wound on her left hand from a cat isnt healing. She is using neosporin and keeping the it wound covered at all times. She states she has one dose of the antibiotic left but feels that she hasn't been given enough antibiotics. She states she was advised to keep the area uncovered but she does not want to do that. She is changing bandage twice a day.     She denies any pain

## 2023-09-28 ENCOUNTER — Ambulatory Visit: Payer: Medicare Other | Admitting: Podiatry

## 2023-10-05 ENCOUNTER — Telehealth: Payer: Self-pay | Admitting: Podiatry

## 2023-10-05 NOTE — Telephone Encounter (Signed)
 Pt called and wanted to get scheduled to see Dr Zettie Hillock this week for her nail trim. I did not have anything available this week and I scheduled her for 1/20 at 1115am although she wanted an afternoon pt.  I checked chart and her last appt was 12/3 for a nail trim and I explained that per insurance it has to be 62 days in between nail trims for them to cover it. So if she comes on 1/23 insurance would probably not cover it. She asked about the cost and I told her we bill 170.00 and she was not happy about the cost.  I explained this multiple times to her as she kept asking me over and over.  She finally decided she would call back but we left the appt for 1/20 for her. She also did ask my name and I did provide that to her.

## 2023-10-10 ENCOUNTER — Ambulatory Visit: Payer: Medicare Other | Admitting: Podiatry

## 2023-10-25 ENCOUNTER — Ambulatory Visit: Payer: Medicare Other | Admitting: Podiatry

## 2023-10-30 ENCOUNTER — Other Ambulatory Visit: Payer: Self-pay | Admitting: Internal Medicine

## 2023-10-31 ENCOUNTER — Other Ambulatory Visit: Payer: Self-pay

## 2023-11-01 ENCOUNTER — Ambulatory Visit: Payer: Medicare Other | Admitting: Podiatry

## 2023-11-09 ENCOUNTER — Ambulatory Visit: Payer: Medicare Other | Admitting: Podiatry

## 2023-11-13 ENCOUNTER — Other Ambulatory Visit: Payer: Self-pay | Admitting: Internal Medicine

## 2023-11-14 ENCOUNTER — Other Ambulatory Visit: Payer: Self-pay

## 2023-11-17 ENCOUNTER — Encounter: Payer: Self-pay | Admitting: Podiatry

## 2023-11-17 ENCOUNTER — Ambulatory Visit (INDEPENDENT_AMBULATORY_CARE_PROVIDER_SITE_OTHER): Payer: Medicare Other | Admitting: Podiatry

## 2023-11-17 DIAGNOSIS — B351 Tinea unguium: Secondary | ICD-10-CM

## 2023-11-17 DIAGNOSIS — M79674 Pain in right toe(s): Secondary | ICD-10-CM | POA: Diagnosis not present

## 2023-11-17 DIAGNOSIS — M79675 Pain in left toe(s): Secondary | ICD-10-CM | POA: Diagnosis not present

## 2023-11-17 NOTE — Progress Notes (Signed)
  Subjective:  Patient ID: Sue Green, female    DOB: [birth year],  MRN: [id number]  Chief Complaint  Patient presents with   Nail Problem    Patient is here for RFC    78 y.o. female presents with the above complaint. History confirmed with patient. Patient presenting with pain related to dystrophic thickened elongated nails. Patient is unable to trim own nails related to nail dystrophy and mobility issues. Patient does not have a history of T2DM.   Objective:  Physical Exam: warm, good capillary refill nail exam onychomycosis of the toenails and dystrophic nails DP pulses palpable, PT pulses palpable, and protective sensation intact Left Foot:  Pain with palpation of nails due to elongation and dystrophic growth.  Right Foot: Pain with palpation of nails due to elongation and dystrophic growth.   Assessment:   1. Pain due to onychomycosis of toenails of both feet      Plan:  Patient was evaluated and treated and all questions answered.  #Onychomycosis with pain  -Nails palliatively debrided as below. -Educated on self-care  Procedure: Nail Debridement Rationale: Pain Type of Debridement: manual, sharp debridement. Instrumentation: Nail nipper, rotary burr. Number of Nails: 10  Return in about 3 months (around 02/14/2024) for Routine Foot Care.         Bronwen Betters, DPM [facility] / [facility]

## 2023-12-28 ENCOUNTER — Ambulatory Visit: Payer: Self-pay

## 2023-12-28 NOTE — Telephone Encounter (Signed)
 Copied from CRM [id number]. Topic: Clinical - Red Word Triage >> Dec 28, 2023  1:14 PM [place] J wrote: [facility] that prompted transfer to Nurse Triage: Patient scraped her forearm on the wooden part of her chair when going to sit down. Arm now has an open would and patient tried to apply medication on it but the instructions stated to not apply on an open wound. Patient's arm is now hurting. Chief Complaint: laceration Symptoms: small or tiny laceration/cut to foreman from wooden chair Frequency: today Pertinent Negatives: Patient denies redness, swelling, drainage Disposition: [] ED /[] Urgent Care (no appt availability in office) / [] Appointment(In office/virtual)/ []  [place] [facility]/ [x] Home Care/ [] Refused Recommended Disposition /[] [place] [facility]/ []  Follow-up with PCP Additional Notes: used neoporion.  Pt called to further home care instructions.  Stated you can barely see cut but wanted to be sure to use the correct products on arm. Bleeding to area has already stopped  Reason for Disposition  Minor cut or scratch  Answer Assessment - Initial Assessment Questions 1. APPEARANCE of INJURY: "What does the injury look like?"      N/a 2. SIZE: "How large is the cut?"      tiny 3. BLEEDING: "Is it bleeding now?" If Yes, ask: "Is it difficult to stop?"      Was bleeding but not now 4. LOCATION: "Where is the injury located?"      forearm 5. ONSET: "How long ago did the injury occur?"      today 6. MECHANISM: "Tell me how it happened."      Wooden chair 7. TETANUS: "When was the last tetanus booster?"     N/a 8. PREGNANCY: "Is there any chance you are pregnant?" "When was your last menstrual period?"     N/a  Protocols used: Cuts and Lacerations-A-AH

## 2024-01-24 ENCOUNTER — Other Ambulatory Visit: Payer: Self-pay

## 2024-01-24 ENCOUNTER — Other Ambulatory Visit: Payer: Self-pay | Admitting: Internal Medicine

## 2024-02-01 ENCOUNTER — Ambulatory Visit (INDEPENDENT_AMBULATORY_CARE_PROVIDER_SITE_OTHER): Payer: Medicare Other | Admitting: Podiatry

## 2024-02-01 DIAGNOSIS — Z91199 Patient's noncompliance with other medical treatment and regimen due to unspecified reason: Secondary | ICD-10-CM

## 2024-02-02 ENCOUNTER — Ambulatory Visit: Payer: Self-pay

## 2024-02-02 NOTE — Progress Notes (Signed)
 1. No-show for appointment

## 2024-02-02 NOTE — Telephone Encounter (Signed)
  Chief Complaint: medication question  Symptoms: na   Pertinent Negatives: Patient denies  Disposition: [] ED /[] Urgent Care (no appt availability in office) / [] Appointment(In office/virtual)/ []  [place] [facility]/ [x] Home Care/ [] Refused Recommended Disposition /[] [place] [facility]/ []  Follow-up with PCP Additional Notes: Pt calling to ask if Ensure is ok to take after drinking Miralax . Pt takes to soften stools. Pt typically takes it before breakfast  but keeps forgetting the last several days.   RN advised pt ok to take now but if pt sees it causes any issues drinking after to remember to drink it first. RN suggested setting out a reminder note each night.  Pt also concerned about potential spam calls regarding her medicare insurance. RN advised pt to call number on back of card to see if legit. Pt had no other concerns. RN gave care advice and pt verbalized understanding.            Copied from CRM [id number]. Topic: Clinical - Medical Advice >> Feb 02, 2024 11:51 AM Sue Green wrote: Reason for CRM: Patient called stating she typically takes Miralax  before breakfast, but forgot to do so this morning. She contacted Miralax  customer service for guidance and was advised to reach out to her PCP. She mentioned that she also drinks Ensure after breakfast and dinner. She informed the representative that she had already taken Ensure today and was told they were unsure if it would be an issue. Patient is now seeking confirmation that everything is okay. Reason for Disposition  Caller has medicine question only, adult not sick, AND triager answers question  Answer Assessment - Initial Assessment Questions 1. NAME of MEDICINE: "What medicine(s) are you calling about?"     Miralax   2. QUESTION: "What is your question?" (e.g., double dose of medicine, side effect)     Can I take miralax  after Ensure  3. PRESCRIBER: "Who prescribed the medicine?" Reason: if prescribed by specialist, call  should be referred to that group.     Na  4. SYMPTOMS: "Do you have any symptoms?" If Yes, ask: "What symptoms are you having?"  "How bad are the symptoms (e.g., mild, moderate, severe)     Denies any issues  Protocols used: Medication Question Call-A-AH

## 2024-02-04 ENCOUNTER — Other Ambulatory Visit: Payer: Self-pay | Admitting: Internal Medicine

## 2024-02-08 ENCOUNTER — Encounter: Payer: Self-pay | Admitting: Podiatrist

## 2024-02-08 ENCOUNTER — Ambulatory Visit (INDEPENDENT_AMBULATORY_CARE_PROVIDER_SITE_OTHER): Admitting: Podiatrist

## 2024-02-08 DIAGNOSIS — B351 Tinea unguium: Secondary | ICD-10-CM

## 2024-02-08 DIAGNOSIS — M79674 Pain in right toe(s): Secondary | ICD-10-CM | POA: Diagnosis not present

## 2024-02-08 DIAGNOSIS — M79676 Pain in unspecified toe(s): Secondary | ICD-10-CM

## 2024-02-08 DIAGNOSIS — M79675 Pain in left toe(s): Secondary | ICD-10-CM

## 2024-02-08 NOTE — Progress Notes (Signed)
 Chief Complaint  Patient presents with   Nail Problem    "Cut my toenails."     HPI: Patient is a generally unpleasant 78 y.o. female who presents today for concerns as listed above.  History confirmed with patient.  She was irritated that I was not in the room at her appointment time and remained so during her visit.   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   Duodenal ulcer    Mild intermittent asthma without complication 07/22/2021   Involuntary trembling 01/13/2021   Left sided abdominal pain 04/17/2020   Ganglion cyst of joint of finger of right hand 06/08/2019   Carotid stenosis 11/26/2016   Acute upper respiratory infection 10/05/2016   Dysuria 10/05/2016   Memory loss 08/23/2014   Acute sinus infection 08/23/2014   Anemia, iron deficiency 03/06/2014   Encounter for well adult exam with abnormal findings 07/31/2013   Urinary urgency 07/17/2012   Chronic cough 07/17/2012   Essential hypertension 07/26/2011   Anxiety and depression 07/26/2011   Constipation 07/26/2011   GERD (gastroesophageal reflux disease) 07/26/2011   Osteopenia 07/26/2011   Tension headache 07/26/2011   Hypothyroidism 07/26/2011   History of alcohol abuse 07/26/2011   Allergic rhinitis 07/26/2011   Fibromyalgia 07/26/2011    Current Outpatient Medications on File Prior to Visit  Medication Sig Dispense Refill   acetaminophen  (TYLENOL ) 325 MG tablet Take 2 tablets (650 mg total) by mouth every 6 (six) hours as needed for mild pain  (or Fever >/= 101).     AMBULATORY NON FORMULARY MEDICATION Nitroglycerine ointment 0.125 %  Apply a pea sized amount internally and on lesion three times daily for 6 weeks 30 g 0   Artificial Saliva (ACT DRY MOUTH) LOZG Use as directed 1 lozenge in the mouth or throat every 6 (six) hours as needed (for a dry mouth).     Ascorbic Acid (VITAMIN C) 500 MG tablet Take 500 mg by mouth daily.     azithromycin  (ZITHROMAX ) 250 MG tablet Take first 2 tablets together, then 1 every day until finished. 6 tablet 0   busPIRone (BUSPAR) 10 MG tablet Take 10 mg by mouth 2 (two) times daily with a meal.     cholecalciferol (VITAMIN D3) 25 MCG (1000 UNIT) tablet Take 1,000 Units by mouth daily.     clobetasol cream (TEMOVATE) 0.05 % SMARTSIG:1 Topical Daily     cyclobenzaprine  (FLEXERIL ) 10 MG tablet TAKE 1 TABLET BY MOUTH EVERY DAY 30 tablet 2   desvenlafaxine (PRISTIQ) 100 MG 24 hr tablet Take 100 mg by mouth daily.     Ferrous Sulfate (IRON) 28 MG TABS Take 28 mg by mouth daily with breakfast.     HYDROcodone -acetaminophen  (NORCO/VICODIN) 5-325 MG tablet TAKE ONE EVERY 4-6 HOURS AS NEEDED FOR PAIN     hydrocortisone  (ANUSOL -HC) 2.5 % rectal cream Place 1 application rectally 2 (two) times daily. For 7-14 days 30 g 0   Krill Oil 300 MG CAPS Take 300 mg by mouth daily.     meloxicam  (MOBIC ) 15 MG  tablet TAKE 1 TABLET (15 MG TOTAL) BY MOUTH DAILY. 30 tablet 0   Methylcellulose, Laxative, (CITRUCEL PO) See admin instructions. Mix 1 tablespoonful of powder into water and drink before breakfast every day     metoprolol  succinate (TOPROL -XL) 50 MG 24 hr tablet TAKE ONE AND 1/2 TABS BY MOUTH IN THE MORNING 135 tablet 2   Misc Natural Products (OSTEO BI-FLEX TRIPLE STRENGTH) TABS Take 1 tablet by mouth in the morning and at bedtime.     montelukast  (SINGULAIR ) 10 MG tablet TAKE 1 TABLET BY MOUTH EVERY DAY 90 tablet 3   Multiple Vitamins-Minerals (ONE-A-DAY WOMENS 50+ ADVANTAGE) TABS Take 1 tablet by mouth daily.      mupirocin ointment (BACTROBAN) 2 % 3 (three) times daily.     Neomycin-Bacitracin-Polymyxin (NEOSPORIN ORIGINAL EX) Apply topically.     OCUVITE LUTEIN 25 25-5 MG CAPS Take 1 capsule by mouth daily.     pantoprazole  (PROTONIX ) 40 MG tablet TAKE 1 TABLET BY MOUTH TWICE A DAY 180 tablet 0   polyethylene glycol powder (GLYCOLAX /MIRALAX ) 17 GM/SCOOP powder Take 8.5 g by mouth See admin instructions. Mix 8.5 grams into 4-8 ounces of water and drink by mouth once a day     SYNTHROID  75 MCG tablet TAKE 1 TABLET BY MOUTH EVERY DAY 90 tablet 2   triamcinolone  (KENALOG ) 0.025 % ointment Apply 1 Application topically 2 (two) times daily. Apply lightly twice a day for 5 days 30 g 0   vitamin B-12 (CYANOCOBALAMIN ) 100 MCG tablet Take 100 mcg by mouth daily.     No current facility-administered medications on file prior to visit.    Allergies  Allergen Reactions   Penicillins Hives   Doxycycline  Nausea Only   Antihistamines, Diphenhydramine-Type Other (See Comments)    Reaction not recalled   Diphenhydramine Other (See Comments)    Reaction not recalled   Lorazepam  Other (See Comments)    Caused trembling in the arms, per the patient   Latex Rash and Other (See Comments)    Patient disputes this in 2022    Review of Systems No fevers, chills, nausea, muscle aches, no difficulty breathing, no calf pain, no chest pain or shortness of breath.   Physical Exam  Physical Exam: warm, good capillary refill nail exam - elongated toenails with mild brittleness noted.  Subjective pain reported.  DP pulses palpable, PT pulses palpable, and protective sensation intact   Assessment     ICD-10-CM   1. Pain due to onychomycosis of toenail  B35.1    M79.676        Plan   Onychoreduction of symptomatic toenails was performed via nail nipper and power burr without iatrogenic incident.    Of note, throughout the visit patient was demanding and critical of my treatment.  She expressed distrust in my  debridement and was continually pulling her feet back and examining her toes before I was finished causing delay.  I did complete the visit despite her behavior.

## 2024-02-11 ENCOUNTER — Encounter: Payer: Self-pay | Admitting: Podiatrist

## 2024-05-07 ENCOUNTER — Other Ambulatory Visit: Payer: Self-pay | Admitting: Internal Medicine

## 2024-05-09 ENCOUNTER — Telehealth: Payer: Self-pay

## 2024-05-09 NOTE — Telephone Encounter (Signed)
 Copied from CRM [id number]. Topic: General - Other >> May 08, 2024  1:35 PM [name] wrote: Reason for CRM: Patient stated she want the provider to call the pharmacy and let them know that she does not need refills right now because she have 11 days left and she takes one a day >> May 08, 2024  1:46 PM [name] wrote: The medication is cyclobenzaprine 

## 2024-05-15 ENCOUNTER — Other Ambulatory Visit: Payer: Self-pay | Admitting: Internal Medicine

## 2024-05-15 NOTE — Telephone Encounter (Signed)
 Copied from CRM #[id number]. Topic: Clinical - Medication Refill >> May 15, 2024 12:11 PM Jasmin G wrote: Medication: cyclobenzaprine  (FLEXERIL ) 10 MG tablet  Has the patient contacted their pharmacy? Yes (Agent: If no, request that the patient contact the pharmacy for the refill. If patient does not wish to contact the pharmacy document the reason why and proceed with request.) (Agent: If yes, when and what did the pharmacy advise?)  This is the patient's preferred pharmacy:  [id number] - [place], [place] - [address] [facility] [address] [place] [address] Phone: [phone] Fax: [phone]  Is this the correct pharmacy for this prescription? Yes If no, delete pharmacy and type the correct one.   Has the prescription been filled recently? Yes  Is the patient out of the medication? Yes  Has the patient been seen for an appointment in the last year OR does the patient have an upcoming appointment? Yes  Can we respond through MyChart? No  Agent: Please be advised that Rx refills may take up to 3 business days. We ask that you follow-up with your pharmacy.

## 2024-05-16 ENCOUNTER — Ambulatory Visit (INDEPENDENT_AMBULATORY_CARE_PROVIDER_SITE_OTHER): Admitting: Podiatry

## 2024-05-16 ENCOUNTER — Encounter: Payer: Self-pay | Admitting: Podiatry

## 2024-05-16 DIAGNOSIS — B351 Tinea unguium: Secondary | ICD-10-CM

## 2024-05-16 DIAGNOSIS — M79672 Pain in left foot: Secondary | ICD-10-CM | POA: Diagnosis not present

## 2024-05-16 DIAGNOSIS — M79671 Pain in right foot: Secondary | ICD-10-CM | POA: Diagnosis not present

## 2024-05-16 NOTE — Progress Notes (Signed)
 Patient presents for evaluation and treatment of tenderness and some redness around nails feet.  Tenderness around toes with walking and wearing shoes.  Physical exam:  General appearance: Alert, pleasant, and in no acute distress.  Vascular: Pedal pulses: DP 2/4 B/L, PT 1/4 B/L.  Mild edema lower legs bilaterally  Neurological:    Dermatologic:  Nails thickened, disfigured, discolored 1-5 BL with subungual debris.  Redness and hypertrophic nail folds along nail folds bilaterally but no signs of drainage or infection.  Musculoskeletal:     Diagnosis: 1. Painful onychomycotic nails 1 through 5 bilaterally. 2. Pain toes 1 through 5 bilaterally.  Plan: Debrided onychomycotic nails 1 through 5 bilaterally.  Return 3 months RFC

## 2024-05-18 ENCOUNTER — Ambulatory Visit: Payer: Self-pay | Admitting: *Deleted

## 2024-05-18 ENCOUNTER — Ambulatory Visit: Payer: Self-pay

## 2024-05-18 ENCOUNTER — Other Ambulatory Visit: Payer: Self-pay | Admitting: Internal Medicine

## 2024-05-18 ENCOUNTER — Telehealth: Payer: Self-pay

## 2024-05-18 MED ORDER — CYCLOBENZAPRINE HCL 10 MG PO TABS: 10.0000 mg | ORAL_TABLET | Freq: Every day | ORAL | 2 refills | Status: DC

## 2024-05-18 NOTE — Telephone Encounter (Signed)
 Attempted to call patient back to try to get her scheduled for a follow up. With an appointment on the books we might be able to get a partial fill. Was not able to leave a voice message due to voice mail box being full.

## 2024-05-18 NOTE — Telephone Encounter (Signed)
 Copied from CRM [id number]. Topic: Appointments - Appointment Info/Confirmation >> May 18, 2024  1:23 PM [name] wrote: Patient/patient representative is calling for information regarding an appointment.   Patient called stating a person named Christyne is supposed to call her regarding a medication refill CB [phone] >> May 18, 2024  3:03 PM Franky GRADE wrote: Patient is calling to follow up on a refill request for cyclobenzaprine  (FLEXERIL ) 10 MG tablet [[id number]] . She will be completely out after today and does not want to go into the long weekend without it.

## 2024-05-18 NOTE — Telephone Encounter (Unsigned)
 Copied from CRM [id number]. Topic: Appointments - Appointment Info/Confirmation >> May 18, 2024  1:23 PM [name] wrote: Patient/patient representative is calling for information regarding an appointment.   Patient called stating a person named Zack is supposed to call her regarding a medication refill CB [phone]

## 2024-05-18 NOTE — Addendum Note (Signed)
 Addended by: NORLEEN LYNWOOD ORN on: 05/18/2024 04:39 PM   Modules accepted: Orders

## 2024-05-18 NOTE — Telephone Encounter (Signed)
 Copied from CRM [id number]. Topic: Clinical - Medical Advice >> May 18, 2024  4:04 PM Thersia BROCKS wrote: Reason for CRM: Patient has been calling in regarding a prescription cyclobenzaprine  (FLEXERIL ) 10 MG tablet , did inform her that because it is a control substance that the provider will need to see her first before he can get it refill, patient wants to know what does she do in the for the next three days without it as she only has one more pill left for tomorrow . Would like to know what that will do to her body not being on it

## 2024-05-18 NOTE — Telephone Encounter (Signed)
 Good thing this issue with flexeril  10 mg has been resolved.  Not sure why I am getting this message

## 2024-05-18 NOTE — Telephone Encounter (Signed)
 Pt called back in to inquire about refill request for Flexeril  10 mg.   I had the Patient Access Specialist forward to [facility] as this is a controlled substance that the provider will need to approve/disapprove.

## 2024-05-18 NOTE — Telephone Encounter (Signed)
 This RN spoke with patient regarding a medication question. Patient requesting advise on what to do with her last dose of medication. Patient also requesting 10 pills of the medication until her appointment next week. Will route to office for follow up.      Reason for Disposition  [1] Caller has NON-URGENT medicine question about med that PCP prescribed AND [2] triager unable to answer question  Answer Assessment - Initial Assessment Questions Patient states she had several days of medications left before and waited until she was almost out. She states she spoke with an RN about the refill and she states that she should call back when she has a little bit of the medication left.She states she has one pill left for tomorrow. States she made an appointment with Dr. Norleen for next week Wednesday. Patient requesting 10 pills until Wednesday . This RN advised patient that refill requests can take 3 days to get filled. Pt states she is concerned about what will happen to her due to not having the medication. Will route to office for follow up. This RN advised pt to seek treatment at nearest ED or UC if she starts to have symptoms.    1. NAME of MEDICINE: What medicine(s) are you calling about?     Cyclobenzaprine   2. QUESTION: What is your question? (e.g., double dose of medicine, side effect)     Requesting a medication refill 3. PRESCRIBER: Who prescribed the medicine? Reason: if prescribed by specialist, call should be referred to that group.     Patient unsure states she starting taking the medication a long time ago.  4. SYMPTOMS: Do you have any symptoms? If Yes, ask: What symptoms are you having?  How bad are the symptoms (e.g., mild, moderate, severe)     Denies symptoms at time.  Protocols used: Medication Question Call-A-AH

## 2024-05-18 NOTE — Telephone Encounter (Signed)
 Ok this refil is done

## 2024-05-18 NOTE — Telephone Encounter (Signed)
 Copied from CRM [id number]. Topic: Clinical - Medication Question >> May 18, 2024 12:18 PM Sue Green wrote: Reason for CRM: patient is requesting a call back to discuss why she can'Green get a refill before the appointment. I advised her that it was denied because an appointment is needed and she is concerned about not having any medication over the weekend. Call back number [phone]

## 2024-05-23 ENCOUNTER — Ambulatory Visit: Admitting: Internal Medicine

## 2024-05-23 ENCOUNTER — Encounter: Payer: Self-pay | Admitting: Internal Medicine

## 2024-05-23 ENCOUNTER — Ambulatory Visit: Payer: Self-pay | Admitting: Internal Medicine

## 2024-05-23 VITALS — BP 120/68 | HR 102 | Temp 98.8°F | Ht 59.0 in | Wt 132.4 lb

## 2024-05-23 DIAGNOSIS — E039 Hypothyroidism, unspecified: Secondary | ICD-10-CM | POA: Diagnosis not present

## 2024-05-23 DIAGNOSIS — E559 Vitamin D deficiency, unspecified: Secondary | ICD-10-CM | POA: Diagnosis not present

## 2024-05-23 DIAGNOSIS — M25442 Effusion, left hand: Secondary | ICD-10-CM

## 2024-05-23 DIAGNOSIS — E538 Deficiency of other specified B group vitamins: Secondary | ICD-10-CM

## 2024-05-23 DIAGNOSIS — R739 Hyperglycemia, unspecified: Secondary | ICD-10-CM | POA: Diagnosis not present

## 2024-05-23 DIAGNOSIS — Z0000 Encounter for general adult medical examination without abnormal findings: Secondary | ICD-10-CM

## 2024-05-23 DIAGNOSIS — D509 Iron deficiency anemia, unspecified: Secondary | ICD-10-CM

## 2024-05-23 DIAGNOSIS — I10 Essential (primary) hypertension: Secondary | ICD-10-CM

## 2024-05-23 DIAGNOSIS — Z0001 Encounter for general adult medical examination with abnormal findings: Secondary | ICD-10-CM

## 2024-05-23 DIAGNOSIS — M1990 Unspecified osteoarthritis, unspecified site: Secondary | ICD-10-CM | POA: Insufficient documentation

## 2024-05-23 DIAGNOSIS — Z1589 Genetic susceptibility to other disease: Secondary | ICD-10-CM | POA: Insufficient documentation

## 2024-05-23 DIAGNOSIS — M2550 Pain in unspecified joint: Secondary | ICD-10-CM | POA: Insufficient documentation

## 2024-05-23 DIAGNOSIS — E7800 Pure hypercholesterolemia, unspecified: Secondary | ICD-10-CM

## 2024-05-23 LAB — CBC WITH DIFFERENTIAL/PLATELET
Basophils Absolute: 0 K/uL (ref 0.0–0.1)
Basophils Relative: 0.3 % (ref 0.0–3.0)
Eosinophils Absolute: 0.2 K/uL (ref 0.0–0.7)
Eosinophils Relative: 2.1 % (ref 0.0–5.0)
HCT: 37 % (ref 36.0–46.0)
Hemoglobin: 12.6 g/dL (ref 12.0–15.0)
Lymphocytes Relative: 10.9 % — ABNORMAL LOW (ref 12.0–46.0)
Lymphs Abs: 0.9 K/uL (ref 0.7–4.0)
MCHC: 34 g/dL (ref 30.0–36.0)
MCV: 99.9 fl (ref 78.0–100.0)
Monocytes Absolute: 0.7 K/uL (ref 0.1–1.0)
Monocytes Relative: 9.3 % (ref 3.0–12.0)
Neutro Abs: 6.1 K/uL (ref 1.4–7.7)
Neutrophils Relative %: 77.4 % — ABNORMAL HIGH (ref 43.0–77.0)
Platelets: 322 K/uL (ref 150.0–400.0)
RBC: 3.7 Mil/uL — ABNORMAL LOW (ref 3.87–5.11)
RDW: 13.2 % (ref 11.5–15.5)
WBC: 7.9 K/uL (ref 4.0–10.5)

## 2024-05-23 LAB — HEPATIC FUNCTION PANEL
ALT: 19 U/L (ref 0–35)
AST: 19 U/L (ref 0–37)
Albumin: 4.2 g/dL (ref 3.5–5.2)
Alkaline Phosphatase: 67 U/L (ref 39–117)
Bilirubin, Direct: 0 mg/dL (ref 0.0–0.3)
Total Bilirubin: 0.2 mg/dL (ref 0.2–1.2)
Total Protein: 7 g/dL (ref 6.0–8.3)

## 2024-05-23 LAB — IBC PANEL
Iron: 129 ug/dL (ref 42–145)
Saturation Ratios: 34.6 % (ref 20.0–50.0)
TIBC: 372.4 ug/dL (ref 250.0–450.0)
Transferrin: 266 mg/dL (ref 212.0–360.0)

## 2024-05-23 LAB — LIPID PANEL
Cholesterol: 160 mg/dL (ref 0–200)
HDL: 39 mg/dL — ABNORMAL LOW (ref >39.00)
LDL Cholesterol: 61 mg/dL (ref 0–99)
NonHDL: 121.47
Total CHOL/HDL Ratio: 4
Triglycerides: 301 mg/dL — ABNORMAL HIGH (ref 0.0–149.0)
VLDL: 60.2 mg/dL — ABNORMAL HIGH (ref 0.0–40.0)

## 2024-05-23 LAB — TSH: TSH: 0.46 u[IU]/mL (ref 0.35–5.50)

## 2024-05-23 LAB — BASIC METABOLIC PANEL WITH GFR
BUN: 15 mg/dL (ref 6–23)
CO2: 31 meq/L (ref 19–32)
Calcium: 9.1 mg/dL (ref 8.4–10.5)
Chloride: 99 meq/L (ref 96–112)
Creatinine, Ser: 0.65 mg/dL (ref 0.40–1.20)
GFR: 84.26 mL/min (ref >60.00)
Glucose, Bld: 184 mg/dL — ABNORMAL HIGH (ref 70–99)
Potassium: 3.5 meq/L (ref 3.5–5.1)
Sodium: 138 meq/L (ref 135–145)

## 2024-05-23 LAB — VITAMIN D 25 HYDROXY (VIT D DEFICIENCY, FRACTURES): VITD: 65.67 ng/mL (ref 30.00–100.00)

## 2024-05-23 LAB — HEMOGLOBIN A1C: Hgb A1c MFr Bld: 6.1 % (ref 4.6–6.5)

## 2024-05-23 LAB — VITAMIN B12: Vitamin B-12: >1500 pg/mL — ABNORMAL HIGH (ref 211–911)

## 2024-05-23 LAB — FERRITIN: Ferritin: 26.3 ng/mL (ref 10.0–291.0)

## 2024-05-23 NOTE — Patient Instructions (Addendum)
 Please have your Shingrix (shingles) shots done at your local pharmacy., and the Flu shot as well  Please call if you would want a referral to the [facility]  Please continue all other medications as before, and refills have been done if requested.  Please have the pharmacy call with any other refills you may need.  Please continue your efforts at being more active, low cholesterol diet, and weight control.  You are otherwise up to date with prevention measures today.  Please keep your appointments with your specialists as you may have planned  Please go to the LAB at the blood drawing area for the tests to be done  You will be contacted by phone if any changes need to be made immediately.  Otherwise, you will receive a letter about your results with an explanation, but please check with MyChart first.  Please make an Appointment to return for your 1 year visit, or sooner if needed

## 2024-05-23 NOTE — Telephone Encounter (Signed)
 Patient has since been scheduled for follow up

## 2024-05-23 NOTE — Assessment & Plan Note (Signed)
 BP Readings from Last 3 Encounters:  05/23/24 120/68  09/20/23 (!) 148/73  09/16/23 (!) 185/74   Stable, pt to continue medical treatment toprl xl 50 qd

## 2024-05-23 NOTE — Telephone Encounter (Signed)
 Pt has appt today

## 2024-05-23 NOTE — Progress Notes (Signed)
 Patient ID: Sue Green, female   DOB: [birth year], 78 y.o.   MRN: [id number]         Chief Complaint:: wellness exam and left 5th finger joint cyst mass, iron def anemia, htn, hld, hyperglycemia, low thyroid ,        HPI:  Sue Green is a 78 y.o. female here for wellness exam; for shingrx at pharmacy, o/w up to date                        Also I believe pt may have mild memory change it seems, and pt is unaware, not o/w pursued today.  Does have chronic anxiety depression but denies SI or HI.  Pt denies chest pain, increased sob or doe, wheezing, orthopnea, PND, increased LE swelling, palpitations, dizziness or syncope.   Pt denies polydipsia, polyuria, or new focal neuro s/s.    Pt denies fever, wt loss, night sweats, loss of appetite, or other constitutional symptoms Also has a raised painless cystic perle like mass from the left finger DIP posteriorly.  No recent overt bleeding.     Wt Readings from Last 3 Encounters:  05/23/24 132 lb 6.4 oz (60.1 kg)  09/16/23 119 lb 14.9 oz (54.4 kg)  09/10/23 120 lb (54.4 kg)   BP Readings from Last 3 Encounters:  05/23/24 120/68  09/20/23 (!) 148/73  09/16/23 (!) 185/74   Immunization History  Administered Date(s) Administered   Fluad Quad(high Dose 65+) 06/05/2020, 07/23/2022   INFLUENZA, HIGH DOSE SEASONAL PF 05/28/2018, 05/17/2019, 06/26/2021, 07/06/2023   Influenza Split 07/05/2011   Influenza, Seasonal, Injecte, Preservative Fre 06/03/2013   Influenza,inj,Quad PF,6+ Mos 07/03/2014, 05/30/2015   Influenza-Unspecified 07/17/2012, 04/30/2016   PFIZER Comirnaty(Gray Top)Covid-19 Tri-Sucrose Vaccine 02/19/2021   PFIZER(Purple Top)SARS-COV-2 Vaccination 11/05/2019, 11/26/2019   Pfizer(Comirnaty)Fall Seasonal Vaccine 12 years and older 07/07/2023   Pneumococcal Conjugate-13 07/31/2013   Pneumococcal Polysaccharide-23 07/17/2012   Td 09/20/2000   Tdap 07/17/2012, 03/25/2017, 06/09/2022, 09/10/2023   Health Maintenance Due  Topic  Date Due   Zoster Vaccines- Shingrix (1 of 2) Never done   Medicare Annual Wellness (AWV)  07/20/2023      Past Medical History:  Diagnosis Date   Allergy    Anal fissure    Anemia, iron deficiency 03/06/2014   Anxiety    Arthritis    Asthma    B12 deficiency    Cholelithiasis    Chronic tension headaches    IN PAST   Depression    Duodenal stenosis    Duodenal ulcer    Fibromyalgia    GERD (gastroesophageal reflux disease)    Glaucoma     Per pt, she does not have glaucoma.   Hepatic cyst    Hypertension    Hypothyroidism    Internal hemorrhoids    Iron deficiency anemia    Osteopenia    Pyloric stenosis    Scoliosis    Thyroid  disease    hypothyroidism   Vitamin D  deficiency    Past Surgical History:  Procedure Laterality Date   APPENDECTOMY  1975   BIOPSY  07/24/2021   Procedure: BIOPSY;  Surgeon: Aneita Gwendlyn DASEN, MD;  Location: [facility];  Service: Endoscopy;;   ESOPHAGOGASTRODUODENOSCOPY (EGD) WITH PROPOFOL  N/A 07/24/2021   Procedure: ESOPHAGOGASTRODUODENOSCOPY (EGD) WITH PROPOFOL ;  Surgeon: Aneita Gwendlyn DASEN, MD;  Location: [facility];  Service: Endoscopy;  Laterality: N/A;   SHOULDER ARTHROSCOPY  2001   rt shoulder   TONSILLECTOMY AND ADENOIDECTOMY  78 years old    reports that she has never smoked. She has never used smokeless tobacco. She reports that she does not drink alcohol and does not use drugs. family history includes Arthritis in her mother; Cancer in her father; Diabetes in her father and mother. Allergies  Allergen Reactions   Penicillins Hives   Doxycycline  Nausea Only   Antihistamines, Diphenhydramine-Type Other (See Comments)    Reaction not recalled   Diphenhydramine Other (See Comments)    Reaction not recalled   Lorazepam  Other (See Comments)    Caused trembling in the arms, per the patient   Latex Rash and Other (See Comments)    Patient disputes this in 2022   Current Outpatient Medications on File Prior to Visit   Medication Sig Dispense Refill   acetaminophen  (TYLENOL ) 325 MG tablet Take 2 tablets (650 mg total) by mouth every 6 (six) hours as needed for mild pain (or Fever >/= 101).     AMBULATORY NON FORMULARY MEDICATION Nitroglycerine ointment 0.125 %  Apply a pea sized amount internally and on lesion three times daily for 6 weeks 30 g 0   Artificial Saliva (ACT DRY MOUTH) LOZG Use as directed 1 lozenge in the mouth or throat every 6 (six) hours as needed (for a dry mouth).     Ascorbic Acid (VITAMIN C) 500 MG tablet Take 500 mg by mouth daily.     azithromycin  (ZITHROMAX ) 250 MG tablet Take first 2 tablets together, then 1 every day until finished. 6 tablet 0   busPIRone (BUSPAR) 10 MG tablet Take 10 mg by mouth 2 (two) times daily with a meal.     cholecalciferol (VITAMIN D3) 25 MCG (1000 UNIT) tablet Take 1,000 Units by mouth daily.     clobetasol cream (TEMOVATE) 0.05 % SMARTSIG:1 Topical Daily     cyclobenzaprine  (FLEXERIL ) 10 MG tablet Take 1 tablet (10 mg total) by mouth daily. 30 tablet 2   desvenlafaxine (PRISTIQ) 100 MG 24 hr tablet Take 100 mg by mouth daily.     Ferrous Sulfate (IRON) 28 MG TABS Take 28 mg by mouth daily with breakfast.     HYDROcodone -acetaminophen  (NORCO/VICODIN) 5-325 MG tablet TAKE ONE EVERY 4-6 HOURS AS NEEDED FOR PAIN     hydrocortisone  (ANUSOL -HC) 2.5 % rectal cream Place 1 application rectally 2 (two) times daily. For 7-14 days 30 g 0   Krill Oil 300 MG CAPS Take 300 mg by mouth daily.     meloxicam  (MOBIC ) 15 MG tablet TAKE 1 TABLET (15 MG TOTAL) BY MOUTH DAILY. 30 tablet 0   Methylcellulose, Laxative, (CITRUCEL PO) See admin instructions. Mix 1 tablespoonful of powder into water and drink before breakfast every day     metoprolol  succinate (TOPROL -XL) 50 MG 24 hr tablet TAKE ONE AND 1/2 TABS BY MOUTH IN THE MORNING 135 tablet 2   Misc Natural Products (OSTEO BI-FLEX TRIPLE STRENGTH) TABS Take 1 tablet by mouth in the morning and at bedtime.     montelukast   (SINGULAIR ) 10 MG tablet TAKE 1 TABLET BY MOUTH EVERY DAY 90 tablet 3   Multiple Vitamins-Minerals (ONE-A-DAY WOMENS 50+ ADVANTAGE) TABS Take 1 tablet by mouth daily.     mupirocin ointment (BACTROBAN) 2 % 3 (three) times daily.     Neomycin-Bacitracin-Polymyxin (NEOSPORIN ORIGINAL EX) Apply topically.     OCUVITE LUTEIN 25 25-5 MG CAPS Take 1 capsule by mouth daily.     pantoprazole  (PROTONIX ) 40 MG tablet TAKE 1 TABLET BY MOUTH TWICE A DAY  180 tablet 0   polyethylene glycol powder (GLYCOLAX /MIRALAX ) 17 GM/SCOOP powder Take 8.5 g by mouth See admin instructions. Mix 8.5 grams into 4-8 ounces of water and drink by mouth once a day     SYNTHROID  75 MCG tablet TAKE 1 TABLET BY MOUTH EVERY DAY 90 tablet 2   triamcinolone  (KENALOG ) 0.025 % ointment Apply 1 Application topically 2 (two) times daily. Apply lightly twice a day for 5 days 30 g 0   vitamin B-12 (CYANOCOBALAMIN ) 100 MCG tablet Take 100 mcg by mouth daily.     No current facility-administered medications on file prior to visit.        ROS:  All others reviewed and negative.  Objective        PE:  BP 120/68 (BP Location: Left Arm, Patient Position: Sitting, Cuff Size: Normal)   Pulse (!) 102   Temp 98.8 F (37.1 C) (Oral)   Ht 4' 11 (1.499 m)   Wt 132 lb 6.4 oz (60.1 kg)   SpO2 95%   BMI 26.74 kg/m                 Constitutional: Pt appears in NAD               HENT: Head: NCAT.                Right Ear: External ear normal.                 Left Ear: External ear normal.                Eyes: . Pupils are equal, round, and reactive to light. Conjunctivae and EOM are normal               Nose: without d/c or deformity               Neck: Neck supple. Gross normal ROM               Cardiovascular: Normal rate and regular rhythm.                 Pulmonary/Chest: Effort normal and breath sounds without rales or wheezing.                Abd:  Soft, NT, ND, + BS, no organomegaly               Neurological: Pt is alert. At baseline  orientation, motor grossly intact               Skin: Skin is warm. No rashes, no other new lesions, LE edema - none               Left fifth finger with yellowish perle like raised cystic mass to posterior PIP               Psychiatric: Pt behavior is normal without agitation   Micro: none  Cardiac tracings I have personally interpreted today:  none  Pertinent Radiological findings (summarize): none   Lab Results  Component Value Date   WBC 7.9 05/23/2024   HGB 12.6 05/23/2024   HCT 37.0 05/23/2024   PLT 322.0 05/23/2024   GLUCOSE 184 (H) 05/23/2024   CHOL 160 05/23/2024   TRIG 301.0 (H) 05/23/2024   HDL 39.00 (L) 05/23/2024   LDLDIRECT 96.0 02/09/2023   LDLCALC 61 05/23/2024   ALT 19 05/23/2024   AST 19 05/23/2024   NA 138 05/23/2024   K 3.5  05/23/2024   CL 99 05/23/2024   CREATININE 0.65 05/23/2024   BUN 15 05/23/2024   CO2 31 05/23/2024   TSH 0.46 05/23/2024   HGBA1C 6.1 05/23/2024   Assessment/Plan:  EDLIN Green is a 78 y.o. White or Caucasian [1] female with  has a past medical history of Allergy, Anal fissure, Anemia, iron deficiency (03/06/2014), Anxiety, Arthritis, Asthma, B12 deficiency, Cholelithiasis, Chronic tension headaches, Depression, Duodenal stenosis, Duodenal ulcer, Fibromyalgia, GERD (gastroesophageal reflux disease), Glaucoma, Hepatic cyst, Hypertension, Hypothyroidism, Internal hemorrhoids, Iron deficiency anemia, Osteopenia, Pyloric stenosis, Scoliosis, Thyroid  disease, and Vitamin D  deficiency.  Encounter for well adult exam with abnormal findings Age and sex appropriate education and counseling updated with regular exercise and diet Referrals for preventative services - none needed Immunizations addressed - for shingrix at pharmacy Smoking counseling  - none needed Evidence for depression or other mood disorder - chronic anxiety depression stable Most recent labs reviewed. I have personally reviewed and have noted: 1) the patient's medical  and social history 2) The patient's current medications and supplements 3) The patient's height, weight, and BMI have been recorded in the chart   Anemia, iron deficiency Also for iron lab and cbc  Essential hypertension BP Readings from Last 3 Encounters:  05/23/24 120/68  09/20/23 (!) 148/73  09/16/23 (!) 185/74   Stable, pt to continue medical treatment toprl xl 50 qd   Finger joint swelling, left Benign appearing, not functionally limiting, pt decliens hand surgury referral  HLD (hyperlipidemia) Lab Results  Component Value Date   LDLCALC 61 05/23/2024   Stable, pt to continue current statin  - diet, wt control   Hyperglycemia Lab Results  Component Value Date   HGBA1C 6.1 05/23/2024   Stable, pt to continue current medical treatment  - diet, wt control   Hypothyroidism Lab Results  Component Value Date   TSH 0.46 05/23/2024   Stable, pt to continue levothyroxine  75 mcg qd  Followup: Return in about 1 year (around 05/23/2025).  Lynwood Rush, MD 05/23/2024 9:41 PM [facility]

## 2024-05-23 NOTE — Assessment & Plan Note (Signed)
 Lab Results  Component Value Date   HGBA1C 6.1 05/23/2024   Stable, pt to continue current medical treatment  - diet, wt control

## 2024-05-23 NOTE — Assessment & Plan Note (Signed)
 Benign appearing, not functionally limiting, pt decliens hand surgury referral

## 2024-05-23 NOTE — Assessment & Plan Note (Signed)
 Also for iron lab and cbc

## 2024-05-23 NOTE — Assessment & Plan Note (Signed)
 Lab Results  Component Value Date   TSH 0.46 05/23/2024   Stable, pt to continue levothyroxine  75 mcg qd

## 2024-05-23 NOTE — Assessment & Plan Note (Signed)
 Age and sex appropriate education and counseling updated with regular exercise and diet Referrals for preventative services - none needed Immunizations addressed - for shingrix at pharmacy Smoking counseling  - none needed Evidence for depression or other mood disorder - chronic anxiety depression stable Most recent labs reviewed. I have personally reviewed and have noted: 1) the patient's medical and social history 2) The patient's current medications and supplements 3) The patient's height, weight, and BMI have been recorded in the chart

## 2024-05-23 NOTE — Assessment & Plan Note (Signed)
 Lab Results  Component Value Date   LDLCALC 61 05/23/2024   Stable, pt to continue current statin  - diet, wt control

## 2024-06-13 ENCOUNTER — Other Ambulatory Visit: Payer: Self-pay | Admitting: Internal Medicine

## 2024-06-13 MED ORDER — CYCLOBENZAPRINE HCL 10 MG PO TABS: 10.0000 mg | ORAL_TABLET | Freq: Every day | ORAL | 2 refills | Status: AC

## 2024-06-13 NOTE — Telephone Encounter (Signed)
 Copied from CRM [id number]. Topic: Clinical - Medication Refill >> Jun 13, 2024 10:47 AM Berneda FALCON wrote: Medication: cyclobenzaprine  (FLEXERIL ) 10 MG tablet  Has the patient contacted their pharmacy? No they tell them to cal us . (Agent: If no, request that the patient contact the pharmacy for the refill. If patient does not wish to contact the pharmacy document the reason why and proceed with request.) (Agent: If yes, when and what did the pharmacy advise?)  This is the patient's preferred pharmacy:  [id number] - [place], [place] - [address] AT [facility] [address] [place] [address] Phone: [phone] Fax: [phone]  Is this the correct pharmacy for this prescription? Yes If no, delete pharmacy and type the correct one.   Has the prescription been filled recently? No  Is the patient out of the medication? No  Has the patient been seen for an appointment in the last year OR does the patient have an upcoming appointment? No  Can we respond through MyChart? No  Agent: Please be advised that Rx refills may take up to 3 business days. We ask that you follow-up with your pharmacy.

## 2024-06-25 ENCOUNTER — Other Ambulatory Visit: Payer: Self-pay | Admitting: Internal Medicine

## 2024-06-26 ENCOUNTER — Other Ambulatory Visit: Payer: Self-pay

## 2024-08-02 ENCOUNTER — Encounter: Payer: Self-pay | Admitting: Podiatry

## 2024-08-02 ENCOUNTER — Ambulatory Visit (INDEPENDENT_AMBULATORY_CARE_PROVIDER_SITE_OTHER): Admitting: Podiatry

## 2024-08-02 DIAGNOSIS — M79671 Pain in right foot: Secondary | ICD-10-CM | POA: Diagnosis not present

## 2024-08-02 DIAGNOSIS — M79672 Pain in left foot: Secondary | ICD-10-CM | POA: Diagnosis not present

## 2024-08-02 DIAGNOSIS — B351 Tinea unguium: Secondary | ICD-10-CM | POA: Diagnosis not present

## 2024-08-02 NOTE — Progress Notes (Signed)
 Patient presents for evaluation and treatment of tenderness and some redness around nails feet.  Tenderness around toes with walking and wearing shoes.  Physical exam:  General appearance: Alert, pleasant, and in no acute distress.  Vascular: Pedal pulses: DP 2/4 B/L, PT 2/4 B/L.  Mild edema lower legs bilaterally.  Capillary refill time immediate bilaterally  Neurologic:  Dermatologic:  Nails thickened, disfigured, discolored 1-5 BL with subungual debris.  Redness and hypertrophic nail folds along nail folds bilaterally but no signs of drainage or infection.  Musculoskeletal:     Diagnosis: 1. Painful onychomycotic nails 1 through 5 bilaterally. 2. Pain toes 1 through 5 bilaterally.  Plan: -Debrided onychomycotic nails 1 through 5 bilaterally.  Sharply debrided nails with nail clipper and reduced with a power bur.  Return 3 months RFC

## 2024-08-04 ENCOUNTER — Other Ambulatory Visit: Payer: Self-pay | Admitting: Internal Medicine

## 2024-08-06 ENCOUNTER — Ambulatory Visit: Admitting: Podiatry

## 2024-08-06 ENCOUNTER — Other Ambulatory Visit: Payer: Self-pay

## 2024-10-14 ENCOUNTER — Other Ambulatory Visit: Payer: Self-pay | Admitting: Internal Medicine

## 2024-10-15 ENCOUNTER — Other Ambulatory Visit: Payer: Self-pay

## 2024-10-17 ENCOUNTER — Ambulatory Visit: Admitting: Podiatry

## 2024-11-02 ENCOUNTER — Ambulatory Visit: Admitting: Podiatry
